# Patient Record
Sex: Female | Born: 1955 | Race: White | Hispanic: No | Marital: Married | Smoking: Never smoker
Health system: Southern US, Community
[De-identification: ages and names within clinical notes are randomized; demographics above are authoritative.]

## PROBLEM LIST (undated history)

## (undated) ENCOUNTER — Emergency Department (HOSPITAL_COMMUNITY): Admission: EM | Discharge status: Against Medical Advice | Payer: Self-pay

## (undated) DIAGNOSIS — I10 Essential (primary) hypertension: Secondary | ICD-10-CM

## (undated) DIAGNOSIS — I2510 Atherosclerotic heart disease of native coronary artery without angina pectoris: Secondary | ICD-10-CM

## (undated) DIAGNOSIS — E785 Hyperlipidemia, unspecified: Secondary | ICD-10-CM

## (undated) DIAGNOSIS — I700 Atherosclerosis of aorta: Secondary | ICD-10-CM

## (undated) DIAGNOSIS — G4700 Insomnia, unspecified: Secondary | ICD-10-CM

## (undated) DIAGNOSIS — I272 Pulmonary hypertension, unspecified: Secondary | ICD-10-CM

## (undated) DIAGNOSIS — F418 Other specified anxiety disorders: Secondary | ICD-10-CM

## (undated) DIAGNOSIS — J45909 Unspecified asthma, uncomplicated: Secondary | ICD-10-CM

## (undated) DIAGNOSIS — I5189 Other ill-defined heart diseases: Secondary | ICD-10-CM

## (undated) DIAGNOSIS — M4800 Spinal stenosis, site unspecified: Secondary | ICD-10-CM

## (undated) DIAGNOSIS — F430 Acute stress reaction: Secondary | ICD-10-CM

## (undated) DIAGNOSIS — G4730 Sleep apnea, unspecified: Secondary | ICD-10-CM

## (undated) DIAGNOSIS — S52502A Unspecified fracture of the lower end of left radius, initial encounter for closed fracture: Secondary | ICD-10-CM

## (undated) DIAGNOSIS — M1990 Unspecified osteoarthritis, unspecified site: Secondary | ICD-10-CM

## (undated) DIAGNOSIS — M48061 Spinal stenosis, lumbar region without neurogenic claudication: Secondary | ICD-10-CM

## (undated) DIAGNOSIS — Z8249 Family history of ischemic heart disease and other diseases of the circulatory system: Secondary | ICD-10-CM

## (undated) DIAGNOSIS — I071 Rheumatic tricuspid insufficiency: Secondary | ICD-10-CM

## (undated) DIAGNOSIS — Z8781 Personal history of (healed) traumatic fracture: Secondary | ICD-10-CM

## (undated) HISTORY — DX: Pulmonary hypertension, unspecified: I27.20

## (undated) HISTORY — PX: JOINT REPLACEMENT: SHX530

## (undated) HISTORY — DX: Unspecified asthma, uncomplicated: J45.909

## (undated) HISTORY — DX: Insomnia, unspecified: G47.00

## (undated) HISTORY — DX: Atherosclerosis of aorta: I70.0

## (undated) HISTORY — DX: Spinal stenosis, lumbar region without neurogenic claudication: M48.061

## (undated) HISTORY — DX: Sleep apnea, unspecified: G47.30

## (undated) HISTORY — DX: Atherosclerotic heart disease of native coronary artery without angina pectoris: I25.10

## (undated) HISTORY — PX: WRIST FRACTURE SURGERY: SHX121

## (undated) HISTORY — DX: Other ill-defined heart diseases: I51.89

## (undated) HISTORY — PX: FRACTURE SURGERY: SHX138

## (undated) HISTORY — DX: Rheumatic tricuspid insufficiency: I07.1

---
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
## 1999-12-09 ENCOUNTER — Encounter: Admission: RE | Admit: 1999-12-09 | Discharge: 1999-12-09 | Payer: Self-pay | Admitting: Obstetrics and Gynecology

## 1999-12-09 ENCOUNTER — Encounter: Payer: Self-pay | Admitting: Obstetrics and Gynecology

## 2000-12-10 ENCOUNTER — Encounter: Admission: RE | Admit: 2000-12-10 | Discharge: 2000-12-10 | Payer: Self-pay | Admitting: Obstetrics and Gynecology

## 2000-12-10 ENCOUNTER — Encounter: Payer: Self-pay | Admitting: Obstetrics and Gynecology

## 2002-06-21 ENCOUNTER — Encounter: Admission: RE | Admit: 2002-06-21 | Discharge: 2002-06-21 | Payer: Self-pay | Admitting: Obstetrics and Gynecology

## 2002-06-21 ENCOUNTER — Encounter: Payer: Self-pay | Admitting: Obstetrics and Gynecology

## 2003-07-30 ENCOUNTER — Encounter: Admission: RE | Admit: 2003-07-30 | Discharge: 2003-07-30 | Payer: Self-pay | Admitting: Obstetrics and Gynecology

## 2004-01-08 ENCOUNTER — Ambulatory Visit: Payer: Self-pay | Admitting: Family Medicine

## 2004-01-22 ENCOUNTER — Ambulatory Visit: Payer: Self-pay | Admitting: Family Medicine

## 2007-01-04 ENCOUNTER — Ambulatory Visit: Payer: Self-pay | Admitting: Family Medicine

## 2007-01-04 DIAGNOSIS — G4730 Sleep apnea, unspecified: Secondary | ICD-10-CM

## 2007-01-04 HISTORY — DX: Sleep apnea, unspecified: G47.30

## 2007-01-04 LAB — CONVERTED CEMR LAB: Rapid Strep: NEGATIVE

## 2007-08-15 ENCOUNTER — Ambulatory Visit: Payer: Self-pay | Admitting: Family Medicine

## 2008-07-31 ENCOUNTER — Ambulatory Visit: Payer: Self-pay | Admitting: Family Medicine

## 2008-07-31 LAB — CONVERTED CEMR LAB
Bacteria, UA: 0
Bilirubin Urine: NEGATIVE
Casts: 0 /lpf
Epithelial cells, urine: 1 /lpf
Glucose, Urine, Semiquant: NEGATIVE
Ketones, urine, test strip: NEGATIVE
Nitrite: NEGATIVE
Protein, U semiquant: NEGATIVE
RBC / HPF: 0
Specific Gravity, Urine: 1.01
Urine crystals, microscopic: 0 /hpf
Urobilinogen, UA: 0.2
WBC Urine, dipstick: NEGATIVE
WBC, UA: 0 cells/hpf
Yeast, UA: 0
pH: 6

## 2008-11-14 ENCOUNTER — Ambulatory Visit: Payer: Self-pay | Admitting: Family Medicine

## 2008-11-19 LAB — CONVERTED CEMR LAB
ALT: 18 units/L (ref 0–35)
AST: 24 units/L (ref 0–37)
Albumin: 3.9 g/dL (ref 3.5–5.2)
Alkaline Phosphatase: 54 units/L (ref 39–117)
BUN: 14 mg/dL (ref 6–23)
Basophils Absolute: 0 10*3/uL (ref 0.0–0.1)
Basophils Relative: 0.2 % (ref 0.0–3.0)
Bilirubin, Direct: 0.1 mg/dL (ref 0.0–0.3)
CO2: 27 meq/L (ref 19–32)
Calcium: 8.9 mg/dL (ref 8.4–10.5)
Chloride: 105 meq/L (ref 96–112)
Cholesterol: 193 mg/dL (ref 0–200)
Creatinine, Ser: 0.6 mg/dL (ref 0.4–1.2)
Eosinophils Absolute: 0.1 10*3/uL (ref 0.0–0.7)
Eosinophils Relative: 1.8 % (ref 0.0–5.0)
GFR calc non Af Amer: 110.69 mL/min (ref >60)
Glucose, Bld: 86 mg/dL (ref 70–99)
HCT: 36.2 % (ref 36.0–46.0)
HDL: 48.8 mg/dL (ref >39.00)
Hemoglobin: 12.7 g/dL (ref 12.0–15.0)
LDL Cholesterol: 128 mg/dL — ABNORMAL HIGH (ref 0–99)
Lymphocytes Relative: 50.9 % — ABNORMAL HIGH (ref 12.0–46.0)
Lymphs Abs: 3.1 10*3/uL (ref 0.7–4.0)
MCHC: 35.2 g/dL (ref 30.0–36.0)
MCV: 95.5 fL (ref 78.0–100.0)
Monocytes Absolute: 0.6 10*3/uL (ref 0.1–1.0)
Monocytes Relative: 10.8 % (ref 3.0–12.0)
Neutro Abs: 2.2 10*3/uL (ref 1.4–7.7)
Neutrophils Relative %: 36.3 % — ABNORMAL LOW (ref 43.0–77.0)
Platelets: 178 10*3/uL (ref 150.0–400.0)
Potassium: 3.8 meq/L (ref 3.5–5.1)
RBC: 3.79 M/uL — ABNORMAL LOW (ref 3.87–5.11)
RDW: 12.4 % (ref 11.5–14.6)
Sodium: 140 meq/L (ref 135–145)
TSH: 1.46 microintl units/mL (ref 0.35–5.50)
Total Bilirubin: 0.7 mg/dL (ref 0.3–1.2)
Total CHOL/HDL Ratio: 4
Total Protein: 7 g/dL (ref 6.0–8.3)
Triglycerides: 81 mg/dL (ref 0.0–149.0)
VLDL: 16.2 mg/dL (ref 0.0–40.0)
WBC: 6 10*3/uL (ref 4.5–10.5)

## 2008-11-20 ENCOUNTER — Other Ambulatory Visit: Admission: RE | Admit: 2008-11-20 | Discharge: 2008-11-20 | Payer: Self-pay | Admitting: Family Medicine

## 2008-11-20 ENCOUNTER — Ambulatory Visit: Payer: Self-pay | Admitting: Family Medicine

## 2008-11-20 ENCOUNTER — Encounter: Payer: Self-pay | Admitting: Family Medicine

## 2008-11-20 DIAGNOSIS — G4700 Insomnia, unspecified: Secondary | ICD-10-CM | POA: Insufficient documentation

## 2008-11-20 LAB — HM PAP SMEAR

## 2008-11-23 ENCOUNTER — Encounter (INDEPENDENT_AMBULATORY_CARE_PROVIDER_SITE_OTHER): Payer: Self-pay | Admitting: *Deleted

## 2009-06-25 ENCOUNTER — Telehealth: Payer: Self-pay | Admitting: Family Medicine

## 2009-06-27 ENCOUNTER — Encounter (INDEPENDENT_AMBULATORY_CARE_PROVIDER_SITE_OTHER): Payer: Self-pay | Admitting: *Deleted

## 2009-07-05 ENCOUNTER — Encounter: Payer: Self-pay | Admitting: Family Medicine

## 2009-07-16 ENCOUNTER — Encounter: Payer: Self-pay | Admitting: Family Medicine

## 2009-07-17 ENCOUNTER — Encounter: Payer: Self-pay | Admitting: Family Medicine

## 2009-08-08 ENCOUNTER — Encounter (INDEPENDENT_AMBULATORY_CARE_PROVIDER_SITE_OTHER): Payer: Self-pay | Admitting: *Deleted

## 2009-08-09 ENCOUNTER — Ambulatory Visit: Payer: Self-pay | Admitting: Gastroenterology

## 2009-08-19 HISTORY — PX: COLONOSCOPY: SHX174

## 2009-08-30 ENCOUNTER — Ambulatory Visit: Payer: Self-pay | Admitting: Gastroenterology

## 2009-08-30 LAB — HM COLONOSCOPY

## 2009-12-02 ENCOUNTER — Telehealth (INDEPENDENT_AMBULATORY_CARE_PROVIDER_SITE_OTHER): Payer: Self-pay | Admitting: *Deleted

## 2009-12-03 ENCOUNTER — Ambulatory Visit: Payer: Self-pay | Admitting: Family Medicine

## 2009-12-04 LAB — CONVERTED CEMR LAB
ALT: 19 units/L (ref 0–35)
AST: 21 units/L (ref 0–37)
Albumin: 3.8 g/dL (ref 3.5–5.2)
Alkaline Phosphatase: 58 units/L (ref 39–117)
BUN: 15 mg/dL (ref 6–23)
Basophils Absolute: 0 10*3/uL (ref 0.0–0.1)
Basophils Relative: 0.6 % (ref 0.0–3.0)
Bilirubin, Direct: 0.1 mg/dL (ref 0.0–0.3)
CO2: 28 meq/L (ref 19–32)
Calcium: 9.2 mg/dL (ref 8.4–10.5)
Chloride: 102 meq/L (ref 96–112)
Cholesterol: 204 mg/dL — ABNORMAL HIGH (ref 0–200)
Creatinine, Ser: 0.7 mg/dL (ref 0.4–1.2)
Direct LDL: 137.8 mg/dL
Eosinophils Absolute: 0.1 10*3/uL (ref 0.0–0.7)
Eosinophils Relative: 2 % (ref 0.0–5.0)
GFR calc non Af Amer: 95.43 mL/min (ref >60)
Glucose, Bld: 86 mg/dL (ref 70–99)
HCT: 39.7 % (ref 36.0–46.0)
HDL: 54.8 mg/dL (ref >39.00)
Hemoglobin: 13.6 g/dL (ref 12.0–15.0)
Lymphocytes Relative: 52.4 % — ABNORMAL HIGH (ref 12.0–46.0)
Lymphs Abs: 3.5 10*3/uL (ref 0.7–4.0)
MCHC: 34.3 g/dL (ref 30.0–36.0)
MCV: 94 fL (ref 78.0–100.0)
Monocytes Absolute: 0.6 10*3/uL (ref 0.1–1.0)
Monocytes Relative: 9.5 % (ref 3.0–12.0)
Neutro Abs: 2.4 10*3/uL (ref 1.4–7.7)
Neutrophils Relative %: 35.5 % — ABNORMAL LOW (ref 43.0–77.0)
Platelets: 224 10*3/uL (ref 150.0–400.0)
Potassium: 4.3 meq/L (ref 3.5–5.1)
RBC: 4.23 M/uL (ref 3.87–5.11)
RDW: 13 % (ref 11.5–14.6)
Sodium: 137 meq/L (ref 135–145)
TSH: 0.78 microintl units/mL (ref 0.35–5.50)
Total Bilirubin: 0.5 mg/dL (ref 0.3–1.2)
Total CHOL/HDL Ratio: 4
Total Protein: 6.8 g/dL (ref 6.0–8.3)
Triglycerides: 98 mg/dL (ref 0.0–149.0)
VLDL: 19.6 mg/dL (ref 0.0–40.0)
WBC: 6.7 10*3/uL (ref 4.5–10.5)

## 2009-12-06 ENCOUNTER — Ambulatory Visit: Payer: Self-pay | Admitting: Family Medicine

## 2010-02-18 NOTE — Miscellaneous (Signed)
Summary: mammogram screening  Clinical Lists Changes  Observations: Added new observation of MAMMO DUE: 07/2010 (07/17/2009 8:34) Added new observation of MAMMOGRAM: normal (07/16/2009 8:34)      Preventive Care Screening  Mammogram:    Date:  07/16/2009    Next Due:  07/2010    Results:  normal

## 2010-02-18 NOTE — Letter (Signed)
Summary: Previsit letter  [facility]  [address] [place], [place] [address]   Phone: [phone]  Fax: [phone]       06/27/2009 MRN: [id number]  Jamie Garza [address] [place], [place]  [address]  Dear [name],  Welcome to the Gastroenterology Division at [facility].    You are scheduled to see a nurse for your pre-procedure visit on 08/09/2009 at 2:30PM on the 3rd floor at [facility], [address]  We ask that you try to arrive at our office 15 minutes prior to your appointment time to allow for check-in.  Your nurse visit will consist of discussing your medical and surgical history, your immediate family medical history, and your medications.    Please bring a complete list of all your medications or, if you prefer, bring the medication bottles and we will list them.  We will need to be aware of both prescribed and over the counter drugs.  We will need to know exact dosage information as well.  If you are on blood thinners (Coumadin, Plavix, Aggrenox, Ticlid, etc.) please call our office today/prior to your appointment, as we need to consult with your physician about holding your medication.   Please be prepared to read and sign documents such as consent forms, a financial agreement, and acknowledgement forms.  If necessary, and with your consent, a friend or relative is welcome to sit-in on the nurse visit with you.  Please bring your insurance card so that we may make a copy of it.  If your insurance requires a referral to see a specialist, please bring your referral form from your primary care physician.  No co-pay is required for this nurse visit.     If you cannot keep your appointment, please call [phone] to cancel or reschedule prior to your appointment date.  This allows [place] the opportunity to schedule an appointment for another patient in need of care.    Thank you for choosing [facility] for your medical needs.   We appreciate the opportunity to care for you.  Please visit [place] at our website  to learn more about our practice.                     Sincerely.                                                                                                                   The Gastroenterology Division

## 2010-02-18 NOTE — Progress Notes (Signed)
Summary: Mammogram and colonoscopy  Phone Note Call from Patient Call back at Work Phone [phone] Call back at Cell  [phone]   Caller: Patient Call For: Judith Part MD Summary of Call: 1. Patient says she needs an order for her mammogram.  She would like to go to [facility] and prefers a Friday afternoon after 1 p.m. appt.   2. She also needs to go ahead and set up her colonoscopy.  Her deductible starts again after September so she would like it scheduled at the end of July or August if at all possible. Initial call taken by: Delilah Shan CMA Duncan Dull),  June 25, 2009 2:16 PM  Follow-up for Phone Call        I ordered both to send to [facility] Follow-up by: Judith Part MD,  June 25, 2009 8:13 PM  New Problems: SCREENING, COLON CANCER (ICD-V76.51) OTHER SCREENING MAMMOGRAM (ICD-V76.12)   New Problems: SCREENING, COLON CANCER (ICD-V76.51) OTHER SCREENING MAMMOGRAM (ICD-V76.12)

## 2010-02-18 NOTE — Letter (Signed)
Summary: [facility] Instructions  [facility]  [address] [place], [place] [address]   Phone: [phone]  Fax: [phone]       KAEYA SCHIFFER    07-Dec-1955    MRN: [id number]        Procedure Day /Date:  08/30/09  Friday     Arrival Time:  8:00am     Procedure Time: 9:00am     Location of Procedure:                    _ x_  [facility] (4th Floor)                        PREPARATION FOR COLONOSCOPY WITH MOVIPREP   Starting 5 days prior to your procedure _8/7/11 _ do not eat nuts, seeds, popcorn, corn, beans, peas,  salads, or any raw vegetables.  Do not take any fiber supplements (e.g. Metamucil, Citrucel, and Benefiber).  THE DAY BEFORE YOUR PROCEDURE         DATE:  08/29/09  DAY: Thursday  1.  Drink clear liquids the entire day-NO SOLID FOOD  2.  Do not drink anything colored red or purple.  Avoid juices with pulp.  No orange juice.  3.  Drink at least 64 oz. (8 glasses) of fluid/clear liquids during the day to prevent dehydration and help the prep work efficiently.  CLEAR LIQUIDS INCLUDE: Water Jello Ice Popsicles Tea (sugar ok, no milk/cream) Powdered fruit flavored drinks Coffee (sugar ok, no milk/cream) Gatorade Juice: apple, white grape, white cranberry  Lemonade Clear bullion, consomm, broth Carbonated beverages (any kind) Strained chicken noodle soup Hard Candy                             4.  In the morning, mix first dose of MoviPrep solution:    Empty 1 Pouch A and 1 Pouch B into the disposable container    Add lukewarm drinking water to the top line of the container. Mix to dissolve    Refrigerate (mixed solution should be used within 24 hrs)  5.  Begin drinking the prep at 5:00 p.m. The MoviPrep container is divided by 4 marks.   Every 15 minutes drink the solution down to the next mark (approximately 8 oz) until the full liter is complete.   6.  Follow completed prep with 16 oz of clear liquid of your choice (Nothing  red or purple).  Continue to drink clear liquids until bedtime.  7.  Before going to bed, mix second dose of MoviPrep solution:    Empty 1 Pouch A and 1 Pouch B into the disposable container    Add lukewarm drinking water to the top line of the container. Mix to dissolve    Refrigerate  THE DAY OF YOUR PROCEDURE      DATE:  08/30/09  DAY:  Friday  Beginning at 4:00 a.m. (5 hours before procedure):         1. Every 15 minutes, drink the solution down to the next mark (approx 8 oz) until the full liter is complete.  2. Follow completed prep with 16 oz. of clear liquid of your choice.    3. You may drink clear liquids until _ 7:00am _ (2 HOURS BEFORE PROCEDURE).   MEDICATION INSTRUCTIONS  Unless otherwise instructed, you should take regular prescription medications with a small sip of water  as early as possible the morning of your procedure.           OTHER INSTRUCTIONS  You will need a responsible adult at least 56 years of age to accompany you and drive you home.   This person must remain in the waiting room during your procedure.  Wear loose fitting clothing that is easily removed.  Leave jewelry and other valuables at home.  However, you may wish to bring a book to read or  an iPod/MP3 player to listen to music as you wait for your procedure to start.  Remove all body piercing jewelry and leave at home.  Total time from sign-in until discharge is approximately 2-3 hours.  You should go home directly after your procedure and rest.  You can resume normal activities the  day after your procedure.  The day of your procedure you should not:   Drive   Make legal decisions   Operate machinery   Drink alcohol   Return to work  You will receive specific instructions about eating, activities and medications before you leave.    The above instructions have been reviewed and explained to me by   Clide Cliff, RN_______________________    I fully understand  and can verbalize these instructions _____________________________ Date _________

## 2010-02-18 NOTE — Letter (Signed)
Summary: Results Follow up Letter  [place] [facility]  [address] [place], [place] [address]   Phone: [phone]  Fax: [phone]    07/17/2009 MRN: [id number]    Jamie Garza [address] [place], [place]  [address]    Dear [name],  The following are the results of your recent test(s):  Test         Result    Pap Smear:        Normal _____  Not Normal _____ Comments: ______________________________________________________ Cholesterol: LDL(Bad cholesterol):         Your goal is less than:         HDL (Good cholesterol):       Your goal is more than: Comments:  ______________________________________________________ Mammogram:        Normal ___X__  Not Normal _____ Comments:Please repeat mammogram in one year.  ___________________________________________________________________ Hemoccult:        Normal _____  Not normal _______ Comments:    _____________________________________________________________________ Other Tests:    We routinely do not discuss normal results over the telephone.  If you desire a copy of the results, or you have any questions about this information we can discuss them at your next office visit.   Sincerely,    Idamae Schuller Tower,MD  MT/ri

## 2010-02-18 NOTE — Procedures (Signed)
Summary: Colonoscopy  Patient: Jamie Garza Note: All result statuses are Final unless otherwise noted.  Tests: (1) Colonoscopy (COL)   COL Colonoscopy           DONE     [facility]     [address]     [place], [place]  [address]           COLONOSCOPY PROCEDURE REPORT           PATIENT:  [name], [name]  MR#:  [id number]     BIRTHDATE:  04/30/1955, 53 yrs. old  GENDER:  female     ENDOSCOPIST:  Vania Rea. Jarold Motto, MD, [facility]     REF. BY:  Marne A. Milinda Antis, M.D.     PROCEDURE DATE:  08/30/2009     PROCEDURE:  Average-risk screening colonoscopy     G0121     ASA CLASS:  Class I     INDICATIONS:  Routine Risk Screening     MEDICATIONS:   Fentanyl 75 mcg IV, Versed 8 mg IV           DESCRIPTION OF PROCEDURE:   After the risks benefits and     alternatives of the procedure were thoroughly explained, informed     consent was obtained.  Digital rectal exam was performed and     revealed no abnormalities.   The LB CF-[id number] endoscope     was introduced through the anus and advanced to the cecum, which     was identified by both the appendix and ileocecal valve, without     limitations.  The quality of the prep was excellent, using     MoviPrep.  The instrument was then slowly withdrawn as the colon     was fully examined.     <<PROCEDUREIMAGES>>           FINDINGS:  Scattered diverticula were found.  No polyps or cancers     were seen.   Retroflexed views in the rectum revealed no     abnormalities.    The scope was then withdrawn from the patient     and the procedure completed.           COMPLICATIONS:  None     ENDOSCOPIC IMPRESSION:     1) Diverticula, scattered     2) No polyps or cancers     RECOMMENDATIONS:     1) high fiber diet     2) Continue current colorectal screening recommendations for     "routine risk" patients with a repeat colonoscopy in 10 years.     REPEAT EXAM:  No           ______________________________     Vania Rea. Jarold Motto, MD, [facility]       CC:           n.     eSIGNED:   Vania Rea. Patterson at 08/30/2009 09:12 AM           Levander Campion, [id number]  Note: An exclamation mark (!) indicates a result that was not dispersed into the flowsheet. Document Creation Date: 08/30/2009 9:13 AM _______________________________________________________________________  (1) Order result status: Final Collection or observation date-time: 08/30/2009 09:06 Requested date-time:  Receipt date-time:  Reported date-time:  Referring Physician:   Ordering Physician: Sheryn Bison [phone]) Specimen Source:  Source: Launa Grill Order Number: [id number] Lab site:   Appended Document: Colonoscopy    Clinical Lists Changes  Observations: Added new observation of COLONNXTDUE: 08/2019 (08/30/2009  11:07)     

## 2010-02-18 NOTE — Progress Notes (Signed)
----   Converted from flag ---- ---- 12/01/2009 9:56 AM, Judith Part MD wrote: please check wellness and lipid v70.0  ---- 11/28/2009 1:56 PM, Liane Comber CMA (AAMA) wrote: Lab orders please! Good Morning! This pt is scheduled for cpx labs Tuesday, which labs to draw and dx codes to use? Thanks Tasha ------------------------------

## 2010-02-18 NOTE — Miscellaneous (Signed)
Summary: DIR COL...AS.  Clinical Lists Changes  Medications: Added new medication of MOVIPREP 100 GM  SOLR (PEG-KCL-NACL-NASULF-NA ASC-C) As directed - Signed Rx of MOVIPREP 100 GM  SOLR (PEG-KCL-NACL-NASULF-NA ASC-C) As directed;  #1 x 0;  Signed;  Entered by: Clide Cliff RN;  Authorized by: Mardella Layman MD [facility];  Method used: Electronically to [facility] [address], [place], [place], [place]  [address], Ph: [phone], Fax: [phone] Observations: Added new observation of ALLERGY REV: Done (08/09/2009 14:10)    Prescriptions: MOVIPREP 100 GM  SOLR (PEG-KCL-NACL-NASULF-NA ASC-C) As directed  #1 x 0   Entered by:   Clide Cliff RN   Authorized by:   Mardella Layman MD [facility]   Signed by:   Clide Cliff RN on 08/09/2009   Method used:   Electronically to        [facility] [address] (retail)       [address]       [place], [place]  [address]       Ph: [phone]       Fax: [phone]   RxID:   [id number]

## 2010-02-18 NOTE — Assessment & Plan Note (Signed)
Summary: CPX/CLE   Vital Signs:  Patient profile:   55 year old female Height:      62.5 inches Weight:      144 pounds BMI:     26.01 Temp:     98.1 degrees F oral Pulse rate:   68 / minute Pulse rhythm:   regular BP sitting:   110 / 68  (left arm) Cuff size:   regular  Vitals Entered By: Lewanda Rife LPN (December 06, 2009 9:47 AM) CC: CPX LMP 4 yrs ago   History of Present Illness: here for wellness exam and to rev chronic med problems  is doing well   wt is down 12 lb lost more and gained back some  used wt watchers and even did well over tax season    110/68- good bp   hx of sleep apnea - tx with wt loss - helped   pap nl 11/ 10 has never had any abn paps  last 3 were normal  no new sexual partners  no gyn symptoms   mamd 6/11-- was ok  self exam --no lumps   colonosc nl 8/11- 10 year f/u rec  Td 2010 had a bad sinus infection 2 weeks ago  feels good but a little sneezing   lipids up a bit with trig 98 and HDL 54 and LDL 137 (was 128)  sleeps well with trazadone  needs refil   Allergies (verified): No Known Drug Allergies  Past History:  Past Medical History: sleep apnea  insomnia  Review of Systems General:  Complains of sleep disorder; denies fatigue, fever, loss of appetite, and malaise. Eyes:  Denies blurring and eye irritation. CV:  Denies chest pain or discomfort and lightheadness. Resp:  Denies cough, shortness of breath, and wheezing. GI:  Denies abdominal pain, bloody stools, change in bowel habits, indigestion, and nausea. GU:  Denies dysuria and urinary frequency. MS:  Denies muscle aches and cramps. Derm:  Denies itching, lesion(s), poor wound healing, and rash. Neuro:  Denies numbness and tingling. Psych:  Denies anxiety and depression. Endo:  Denies cold intolerance, excessive thirst, excessive urination, and heat intolerance. Heme:  Denies abnormal bruising and bleeding.  Physical Exam  General:   Well-developed,well-nourished,in no acute distress; alert,appropriate and cooperative throughout examination Head:  normocephalic, atraumatic, and no abnormalities observed.   Eyes:  vision grossly intact, pupils equal, pupils round, and pupils reactive to light.  no conjunctival pallor, injection or icterus  Ears:  R ear normal and L ear normal.   Nose:  no nasal discharge.  - nares are boggy Mouth:  pharynx pink and moist, no erythema, and no exudates.   Neck:  supple with full rom and no masses or thyromegally, no JVD or carotid bruit  Breasts:  No mass, nodules, thickening, tenderness, bulging, retraction, inflamation, nipple discharge or skin changes noted.   Lungs:  Normal respiratory effort, chest expands symmetrically. Lungs are clear to auscultation, no crackles or wheezes. Heart:  Normal rate and regular rhythm. S1 and S2 normal without gallop, murmur, click, rub or other extra sounds. Abdomen:  Bowel sounds positive,abdomen soft and non-tender without masses, organomegaly or hernias noted. no renal bruits  Msk:  No deformity or scoliosis noted of thoracic or lumbar spine.  no acute joint changes  Pulses:  R and L carotid,radial,femoral,dorsalis pedis and posterior tibial pulses are full and equal bilaterally Extremities:  No clubbing, cyanosis, edema, or deformity noted with normal full range of motion of all joints.  Neurologic:  sensation intact to light touch, gait normal, and DTRs symmetrical and normal.   Skin:  nl color and turgor lentigos diffusely Cervical Nodes:  No lymphadenopathy noted Axillary Nodes:  No palpable lymphadenopathy Inguinal Nodes:  No significant adenopathy Psych:  normal affect, talkative and pleasant    Impression & Recommendations:  Problem # 1:  HEALTH MAINTENANCE EXAM (ICD-V70.0) Assessment Comment Only reviewed health habits including diet, exercise and skin cancer prevention reviewed health maintenance list and family history pt will check on  cost of flu shot before getting it  disc labs in detail -- chol up -- disc diet   Problem # 2:  INSOMNIA (ICD-780.52)  doing well wit trazadone - will refil   Orders: Prescription Created Electronically [phone])  Complete Medication List: 1)  Estraven Otc  .... As directed 2)  Trazodone Hcl 50 Mg Tabs (Trazodone hcl) .Marland Kitchen.. 1 by mouth once daily at bedtime  Patient Instructions: 1)  cholesterol went up a bit  2)  you can raise your HDL (good cholesterol) by increasing exercise and eating omega 3 fatty acid supplement like fish oil or flax seed oil over the counter 3)  you can lower LDL (bad cholesterol) by limiting saturated fats in diet like red meat, fried foods, egg yolks, fatty breakfast meats, high fat dairy products and shellfish  4)  call back if you want to get a flu shot here  Prescriptions: TRAZODONE HCL 50 MG TABS (TRAZODONE HCL) 1 by mouth once daily at bedtime  #30 x 11   Entered and Authorized by:   Judith Part MD   Signed by:   Judith Part MD on 12/06/2009   Method used:   Electronically to        [facility] [address] (retail)       [address], [address]       [place], [place]  [address]       Ph: [phone]       Fax: [phone]   RxID:   [id number]    Orders Added: 1)  Prescription Created Electronically [G8553] 2)  Est. Patient 40-64 years [id number]    Current Allergies (reviewed today): No known allergies

## 2010-12-05 LAB — HM MAMMOGRAPHY: HM Mammogram: NEGATIVE

## 2010-12-08 ENCOUNTER — Other Ambulatory Visit: Payer: Self-pay | Admitting: Family Medicine

## 2010-12-08 NOTE — Telephone Encounter (Signed)
Will refill electronically  

## 2010-12-15 ENCOUNTER — Telehealth: Payer: Self-pay | Admitting: Family Medicine

## 2010-12-15 DIAGNOSIS — Z0000 Encounter for general adult medical examination without abnormal findings: Secondary | ICD-10-CM | POA: Insufficient documentation

## 2010-12-15 NOTE — Telephone Encounter (Signed)
Message copied by Judy Pimple on Mon Dec 15, 2010  6:29 PM ------      Message from: Alvina Chou      Created: Tue Dec 09, 2010  3:35 PM      Regarding: Labs, Tuesday 11.27       Patient is scheduled for CPX labs, please order future labs, Thanks , Camelia Eng

## 2010-12-16 ENCOUNTER — Other Ambulatory Visit (INDEPENDENT_AMBULATORY_CARE_PROVIDER_SITE_OTHER): Payer: BC Managed Care – PPO

## 2010-12-16 DIAGNOSIS — Z0000 Encounter for general adult medical examination without abnormal findings: Secondary | ICD-10-CM

## 2010-12-16 LAB — COMPREHENSIVE METABOLIC PANEL
BUN: 12 mg/dL (ref 6–23)
CO2: 27 mEq/L (ref 19–32)
Creatinine, Ser: 0.7 mg/dL (ref 0.4–1.2)
GFR: 90.45 mL/min (ref >60.00)
Glucose, Bld: 98 mg/dL (ref 70–99)
Total Bilirubin: 0.5 mg/dL (ref 0.3–1.2)

## 2010-12-16 LAB — LIPID PANEL
HDL: 59.8 mg/dL (ref >39.00)
Total CHOL/HDL Ratio: 3
Triglycerides: 93 mg/dL (ref 0.0–149.0)

## 2010-12-16 LAB — CBC WITH DIFFERENTIAL/PLATELET
Basophils Absolute: 0 10*3/uL (ref 0.0–0.1)
Eosinophils Relative: 2 % (ref 0.0–5.0)
HCT: 38.7 % (ref 36.0–46.0)
Hemoglobin: 13.1 g/dL (ref 12.0–15.0)
Lymphs Abs: 2.8 10*3/uL (ref 0.7–4.0)
Monocytes Relative: 9.3 % (ref 3.0–12.0)
Neutro Abs: 2.7 10*3/uL (ref 1.4–7.7)
RDW: 13.5 % (ref 11.5–14.6)

## 2010-12-18 ENCOUNTER — Encounter: Payer: Self-pay | Admitting: Family Medicine

## 2010-12-19 ENCOUNTER — Other Ambulatory Visit (HOSPITAL_COMMUNITY)
Admission: RE | Admit: 2010-12-19 | Discharge: 2010-12-19 | Disposition: A | Discharge status: Home / Self Care | Payer: BC Managed Care – PPO | Source: Ambulatory Visit | Attending: Family Medicine | Admitting: Family Medicine

## 2010-12-19 ENCOUNTER — Encounter: Payer: Self-pay | Admitting: Family Medicine

## 2010-12-19 ENCOUNTER — Ambulatory Visit (INDEPENDENT_AMBULATORY_CARE_PROVIDER_SITE_OTHER): Payer: BC Managed Care – PPO | Admitting: Family Medicine

## 2010-12-19 VITALS — BP 118/74 | HR 72 | Temp 98.3°F | Ht 62.25 in | Wt 151.8 lb

## 2010-12-19 DIAGNOSIS — Z1231 Encounter for screening mammogram for malignant neoplasm of breast: Secondary | ICD-10-CM

## 2010-12-19 DIAGNOSIS — Z01419 Encounter for gynecological examination (general) (routine) without abnormal findings: Secondary | ICD-10-CM | POA: Insufficient documentation

## 2010-12-19 DIAGNOSIS — Z0000 Encounter for general adult medical examination without abnormal findings: Secondary | ICD-10-CM

## 2010-12-19 DIAGNOSIS — Z1159 Encounter for screening for other viral diseases: Secondary | ICD-10-CM | POA: Insufficient documentation

## 2010-12-19 MED ORDER — TRAZODONE HCL 50 MG PO TABS: 50.0000 mg | ORAL_TABLET | Freq: Every day | ORAL | Status: DC

## 2010-12-19 NOTE — Patient Instructions (Signed)
Make sure to get your flu shot asap at a pharmacy Pap done today Keep working on healthy diet and exercise

## 2010-12-19 NOTE — Progress Notes (Signed)
Subjective:    Patient ID: Jamie Garza, female    DOB: 08-Mar-1955, 55 y.o.   MRN: [id number]  HPI Here for annual health mt exam and to review chronic medical problems  Is feeling good overall   Had some knee problems this year-- turned into arthritis , had some cortisone injections -- is getting better  Very stressful tax season   Is getting married new years eve !! Dating for almost a year - but friends 15 years   bp is 118/74 Wt is up 7 lb with bmi of 27 Diet- feels like she needs to better with that  Exercise - is pretty good , has to do lower impact activities   Lipids pretty good - she does eat both healthy and junk food (worse during stress) LDL 122 Lab Results  Component Value Date   CHOL 200 12/16/2010   HDL 59.80 12/16/2010   LDLCALC 122* 12/16/2010   LDLDIRECT 137.8 12/03/2009   TRIG 93.0 12/16/2010   CHOLHDL 3 12/16/2010   very good HDL  Flu shot- has not had yet -- insurance issues - will get at drugstore when she leaves here  TD 2010  Mam 2 weeks ago and was normal  Self exam no lumps or problems    Pap 11/10 Does not go to a gyn  Will do pap today- has a new partner   colonosc 8/11 - 10 year recall  Patient Active Problem List  Diagnoses  . INSOMNIA  . SLEEP APNEA  . Routine general medical examination at a health care facility  . Gynecological examination  . Other screening mammogram   Past Medical History  Diagnosis Date  . Sleep apnea   . Insomnia    No past surgical history on file. History  Substance Use Topics  . Smoking status: Never Smoker   . Smokeless tobacco: Not on file  . Alcohol Use: Yes     occasional   Family History  Problem Relation Age of Onset  . Diabetes Mother     borderline DM  . Diabetes Maternal Aunt   . Diabetes Maternal Grandfather   . Diabetes Paternal Grandfather    No Known Allergies No current outpatient prescriptions on file prior to visit.      Review of Systems Review of Systems    Constitutional: Negative for fever, appetite change, fatigue and unexpected weight change.  Eyes: Negative for pain and visual disturbance.  Respiratory: Negative for cough and shortness of breath.   Cardiovascular: Negative for cp or palpitations    Gastrointestinal: Negative for nausea, diarrhea and constipation.  Genitourinary: Negative for urgency and frequency.  Skin: Negative for pallor or rash   MSK pos for knee pain that is improved Neurological: Negative for weakness, light-headedness, numbness and headaches.  Hematological: Negative for adenopathy. Does not bruise/bleed easily.  Psychiatric/Behavioral: Negative for dysphoric mood. The patient is not nervous/anxious.          Objective:   Physical Exam  Constitutional: She appears well-developed and well-nourished. No distress.  HENT:  Head: Normocephalic and atraumatic.  Right Ear: External ear normal.  Left Ear: External ear normal.  Nose: Nose normal.  Mouth/Throat: Oropharynx is clear and moist.  Eyes: Conjunctivae and EOM are normal. Pupils are equal, round, and reactive to light. No scleral icterus.  Neck: Normal range of motion. Neck supple. No JVD present. Carotid bruit is not present. No thyromegaly present.  Cardiovascular: Normal rate, regular rhythm, normal heart sounds and intact distal pulses.  No murmur heard. Pulmonary/Chest: Effort normal and breath sounds normal. No respiratory distress. She has no wheezes. She exhibits no tenderness.  Abdominal: Soft. Bowel sounds are normal. She exhibits no distension, no abdominal bruit and no mass. There is no tenderness.  Genitourinary: Vagina normal and uterus normal. No breast swelling, tenderness, discharge or bleeding. No vaginal discharge found.       No breast masses noted   Musculoskeletal: Normal range of motion. She exhibits no edema.  Lymphadenopathy:    She has no cervical adenopathy.  Neurological: She is alert. She has normal reflexes. No cranial  nerve deficit. She exhibits normal muscle tone. Coordination normal.  Skin: Skin is warm and dry. No rash noted. No erythema. No pallor.  Psychiatric: She has a normal mood and affect.          Assessment & Plan:

## 2010-12-21 NOTE — Assessment & Plan Note (Signed)
Nl mam 2 wk ago  Nl breast exam Enc monthly self exams

## 2010-12-21 NOTE — Assessment & Plan Note (Signed)
Annual exam with pap - no problems Pt does have new partner- monogamous

## 2010-12-21 NOTE — Assessment & Plan Note (Signed)
Reviewed health habits including diet and exercise and skin cancer prevention Also reviewed health mt list, fam hx and immunizations  Rev wellness labs in detail 

## 2010-12-25 ENCOUNTER — Encounter: Payer: Self-pay | Admitting: *Deleted

## 2011-01-20 HISTORY — PX: WRIST FRACTURE SURGERY: SHX121

## 2011-03-05 ENCOUNTER — Encounter: Payer: Self-pay | Admitting: Family Medicine

## 2011-05-17 ENCOUNTER — Emergency Department: Payer: Self-pay | Admitting: Emergency Medicine

## 2011-05-17 ENCOUNTER — Ambulatory Visit: Payer: Self-pay | Admitting: Family Medicine

## 2011-05-23 ENCOUNTER — Ambulatory Visit (HOSPITAL_COMMUNITY)
Admission: AD | Admit: 2011-05-23 | Discharge: 2011-05-23 | Disposition: A | Discharge status: Home / Self Care | Payer: BC Managed Care – PPO | Source: Ambulatory Visit | Attending: Orthopedic Surgery | Admitting: Orthopedic Surgery

## 2011-05-23 ENCOUNTER — Inpatient Hospital Stay (HOSPITAL_COMMUNITY): Payer: BC Managed Care – PPO | Admitting: Anesthesiology

## 2011-05-23 ENCOUNTER — Encounter (HOSPITAL_COMMUNITY): Payer: Self-pay | Admitting: *Deleted

## 2011-05-23 ENCOUNTER — Encounter (HOSPITAL_COMMUNITY)
Admission: AD | Disposition: A | Discharge status: Home / Self Care | Payer: Self-pay | Source: Ambulatory Visit | Attending: Orthopedic Surgery

## 2011-05-23 ENCOUNTER — Encounter (HOSPITAL_COMMUNITY): Payer: Self-pay | Admitting: Anesthesiology

## 2011-05-23 DIAGNOSIS — Z8781 Personal history of (healed) traumatic fracture: Secondary | ICD-10-CM | POA: Diagnosis present

## 2011-05-23 DIAGNOSIS — S52599A Other fractures of lower end of unspecified radius, initial encounter for closed fracture: Secondary | ICD-10-CM | POA: Insufficient documentation

## 2011-05-23 DIAGNOSIS — S52502A Unspecified fracture of the lower end of left radius, initial encounter for closed fracture: Secondary | ICD-10-CM

## 2011-05-23 DIAGNOSIS — X58XXXA Exposure to other specified factors, initial encounter: Secondary | ICD-10-CM | POA: Insufficient documentation

## 2011-05-23 HISTORY — DX: Unspecified fracture of the lower end of left radius, initial encounter for closed fracture: S52.502A

## 2011-05-23 HISTORY — DX: Personal history of (healed) traumatic fracture: Z87.81

## 2011-05-23 LAB — COMPREHENSIVE METABOLIC PANEL
ALT: 12 U/L (ref 0–35)
AST: 19 U/L (ref 0–37)
Calcium: 9.3 mg/dL (ref 8.4–10.5)
Creatinine, Ser: 0.57 mg/dL (ref 0.50–1.10)
GFR calc Af Amer: >90 mL/min (ref >90)
Sodium: 141 mEq/L (ref 135–145)
Total Protein: 7.3 g/dL (ref 6.0–8.3)

## 2011-05-23 LAB — CBC
MCH: 31.6 pg (ref 26.0–34.0)
MCHC: 34 g/dL (ref 30.0–36.0)
Platelets: 208 10*3/uL (ref 150–400)

## 2011-05-23 LAB — SURGICAL PCR SCREEN: MRSA, PCR: NEGATIVE

## 2011-05-23 SURGERY — OPEN REDUCTION INTERNAL FIXATION (ORIF) DISTAL RADIUS FRACTURE
Anesthesia: General | Site: Wrist | Laterality: Left | Wound class: Clean

## 2011-05-23 MED ORDER — LACTATED RINGERS IV SOLN
INTRAVENOUS | Status: DC | PRN
  Administered 2011-05-23 (×2): via INTRAVENOUS

## 2011-05-23 MED ORDER — LIDOCAINE-EPINEPHRINE (PF) 2 %-1:200000 IJ SOLN
INTRAMUSCULAR | Status: DC | PRN
  Administered 2011-05-23: 15 mL via INTRADERMAL

## 2011-05-23 MED ORDER — HYDROMORPHONE HCL PF 1 MG/ML IJ SOLN: 0.2500 mg | INTRAMUSCULAR | Status: DC | PRN

## 2011-05-23 MED ORDER — BUPIVACAINE-EPINEPHRINE PF 0.5-1:200000 % IJ SOLN
INTRAMUSCULAR | Status: DC | PRN
  Administered 2011-05-23: 30 mL

## 2011-05-23 MED ORDER — PROPOFOL 10 MG/ML IV EMUL
INTRAVENOUS | Status: DC | PRN
  Administered 2011-05-23: 150 mg via INTRAVENOUS

## 2011-05-23 MED ORDER — MUPIROCIN 2 % EX OINT
TOPICAL_OINTMENT | CUTANEOUS | Status: AC
  Filled 2011-05-23: qty 22

## 2011-05-23 MED ORDER — FENTANYL CITRATE 0.05 MG/ML IJ SOLN
INTRAMUSCULAR | Status: DC | PRN
  Administered 2011-05-23 (×2): 50 ug via INTRAVENOUS

## 2011-05-23 MED ORDER — ONDANSETRON HCL 4 MG/2ML IJ SOLN
INTRAMUSCULAR | Status: DC | PRN
  Administered 2011-05-23: 4 mg via INTRAVENOUS

## 2011-05-23 MED ORDER — CEFAZOLIN SODIUM 1-5 GM-% IV SOLN
INTRAVENOUS | Status: AC
  Administered 2011-05-23: 1 g via INTRAVENOUS
  Filled 2011-05-23: qty 50

## 2011-05-23 MED ORDER — DEXTROSE 5 % IV SOLN
INTRAVENOUS | Status: DC | PRN
  Administered 2011-05-23: 10:00:00 via INTRAVENOUS

## 2011-05-23 MED ORDER — MIDAZOLAM HCL 5 MG/5ML IJ SOLN
INTRAMUSCULAR | Status: DC | PRN
  Administered 2011-05-23 (×2): 1 mg via INTRAVENOUS

## 2011-05-23 SURGICAL SUPPLY — 65 items
APL SKNCLS STERI-STRIP NONHPOA (GAUZE/BANDAGES/DRESSINGS) ×1
BANDAGE ELASTIC 3 VELCRO ST LF (GAUZE/BANDAGES/DRESSINGS) ×2 IMPLANT
BANDAGE ELASTIC 4 VELCRO ST LF (GAUZE/BANDAGES/DRESSINGS) ×2 IMPLANT
BENZOIN TINCTURE PRP APPL 2/3 (GAUZE/BANDAGES/DRESSINGS) ×2 IMPLANT
BIT DRILL 2 FAST STEP (BIT) ×1 IMPLANT
BIT DRILL 2.5X4 QC (BIT) ×1 IMPLANT
BLADE SURG ROTATE 9660 (MISCELLANEOUS) IMPLANT
BNDG CMPR 9X4 STRL LF SNTH (GAUZE/BANDAGES/DRESSINGS) ×1
BNDG ESMARK 4X9 LF (GAUZE/BANDAGES/DRESSINGS) ×2 IMPLANT
CLOTH BEACON ORANGE TIMEOUT ST (SAFETY) ×2 IMPLANT
CLSR STERI-STRIP ANTIMIC 1/2X4 (GAUZE/BANDAGES/DRESSINGS) ×1 IMPLANT
CORDS BIPOLAR (ELECTRODE) ×2 IMPLANT
COVER SURGICAL LIGHT HANDLE (MISCELLANEOUS) ×4 IMPLANT
CUFF TOURNIQUET SINGLE 18IN (TOURNIQUET CUFF) ×2 IMPLANT
CUFF TOURNIQUET SINGLE 24IN (TOURNIQUET CUFF) IMPLANT
DECANTER SPIKE VIAL GLASS SM (MISCELLANEOUS) IMPLANT
DRAPE INCISE IOBAN 66X45 STRL (DRAPES) IMPLANT
DRAPE OEC MINIVIEW 54X84 (DRAPES) IMPLANT
DRAPE U-SHAPE 47X51 STRL (DRAPES) ×2 IMPLANT
ELECT REM PT RETURN 9FT ADLT (ELECTROSURGICAL)
ELECTRODE REM PT RTRN 9FT ADLT (ELECTROSURGICAL) IMPLANT
GAUZE XEROFORM 1X8 LF (GAUZE/BANDAGES/DRESSINGS) ×2 IMPLANT
GLOVE BIOGEL PI IND STRL 8 (GLOVE) ×1 IMPLANT
GLOVE BIOGEL PI INDICATOR 8 (GLOVE) ×1
GLOVE ORTHO TXT STRL SZ7.5 (GLOVE) ×2 IMPLANT
GLOVE SS BIOGEL STRL SZ 8 (GLOVE) ×1 IMPLANT
GLOVE SUPERSENSE BIOGEL SZ 8 (GLOVE) ×1
GLOVE SURG ORTHO 8.0 STRL STRW (GLOVE) ×4 IMPLANT
GOWN STRL NON-REIN LRG LVL3 (GOWN DISPOSABLE) IMPLANT
K-WIRE 1.6 (WIRE) ×6
K-WIRE FX5X1.6XNS BN SS (WIRE) ×3
KIT BASIN OR (CUSTOM PROCEDURE TRAY) ×2 IMPLANT
KIT ROOM TURNOVER OR (KITS) ×2 IMPLANT
KWIRE FX5X1.6XNS BN SS (WIRE) IMPLANT
LOOP VESSEL MAXI BLUE (MISCELLANEOUS) ×2 IMPLANT
MANIFOLD NEPTUNE II (INSTRUMENTS) ×2 IMPLANT
NDL BLUNT 16X1.5 OR ONLY (NEEDLE) IMPLANT
NEEDLE 22X1 1/2 (OR ONLY) (NEEDLE) IMPLANT
NEEDLE BLUNT 16X1.5 OR ONLY (NEEDLE) IMPLANT
NS IRRIG 1000ML POUR BTL (IV SOLUTION) ×4 IMPLANT
PACK ORTHO EXTREMITY (CUSTOM PROCEDURE TRAY) ×2 IMPLANT
PAD ARMBOARD 7.5X6 YLW CONV (MISCELLANEOUS) ×4 IMPLANT
PAD CAST 4YDX4 CTTN HI CHSV (CAST SUPPLIES) ×1 IMPLANT
PADDING CAST COTTON 4X4 STRL (CAST SUPPLIES) ×2
PEG SUBCHONDRAL SMOOTH 2.0X16 (Peg) ×2 IMPLANT
PEG SUBCHONDRAL SMOOTH 2.0X18 (Peg) ×4 IMPLANT
PLATE SHORT 21.6X48.9 NRRW LT (Plate) ×1 IMPLANT
SCREW BN 12X3.5XNS CORT TI (Screw) IMPLANT
SCREW CORT 3.5X12 (Screw) ×4 IMPLANT
SPLINT PLASTER CAST XFAST 4X15 (CAST SUPPLIES) IMPLANT
SPLINT PLASTER XTRA FAST SET 4 (CAST SUPPLIES) ×1
SPONGE GAUZE 4X4 12PLY (GAUZE/BANDAGES/DRESSINGS) ×2 IMPLANT
SPONGE LAP 4X18 X RAY DECT (DISPOSABLE) ×2 IMPLANT
STAPLER VISISTAT 35W (STAPLE) IMPLANT
STRIP CLOSURE SKIN 1/2X4 (GAUZE/BANDAGES/DRESSINGS) ×2 IMPLANT
SUCTION FRAZIER TIP 10 FR DISP (SUCTIONS) ×2 IMPLANT
SUT ETHILON 4 0 PS 2 18 (SUTURE) IMPLANT
SUT PROLENE 4 0 PS 2 18 (SUTURE) IMPLANT
SUT VIC AB 3-0 FS2 27 (SUTURE) IMPLANT
SYR CONTROL 10ML LL (SYRINGE) IMPLANT
TOWEL OR 17X24 6PK STRL BLUE (TOWEL DISPOSABLE) ×2 IMPLANT
TOWEL OR 17X26 10 PK STRL BLUE (TOWEL DISPOSABLE) ×2 IMPLANT
TUBE CONNECTING 12X1/4 (SUCTIONS) ×2 IMPLANT
WATER STERILE IRR 1000ML POUR (IV SOLUTION) ×4 IMPLANT
YANKAUER SUCT BULB TIP NO VENT (SUCTIONS) IMPLANT

## 2011-05-23 NOTE — Preoperative (Signed)
Beta Blockers   Reason not to administer Beta Blockers:Not Applicable 

## 2011-05-23 NOTE — Op Note (Signed)
05/23/2011  11:08 AM  PATIENT:  Jamie Garza    PRE-OPERATIVE DIAGNOSIS:  Left Distal Radius Fracture  POST-OPERATIVE DIAGNOSIS:  Same  PROCEDURE:  OPEN REDUCTION INTERNAL FIXATION (ORIF) DISTAL RADIAL FRACTURE, 3 pieces  SURGEON:  Eulas Post, MD  PHYSICIAN ASSISTANT: Janace Litten, OPA-C, present and scrubbed throughout the case, critical for completion in a timely fashion, and for retraction, instrumentation, and closure.  ANESTHESIA:   General  PREOPERATIVE INDICATIONS:  Jamie Garza is a  56 y.o. female with a diagnosis of Left Distal Radius Fracture whoelected for surgical management due to the intra-articular displacement, shortening, and loss of height.  The risks benefits and alternatives were discussed with the patient preoperatively including but not limited to the risks of infection, bleeding, nerve injury, cardiopulmonary complications, the need for revision surgery, among others, and the patient was willing to proceed. We also discussed the risks of tendon rupture, malunion, nonunion, hardware prominence, hardware failure, need for hardware removal, reflex sympathetic dystrophy, among others.  OPERATIVE IMPLANTS: Biomet DVR volar plate with 2 proximal cortical screws and multiple distal interlocking smooth pegs, using the short narrow plate.  OPERATIVE FINDINGS: Comminution of the distal radius fracture, with a volar Barton's shift.  OPERATIVE PROCEDURE: The patient is brought to the operating room and placed in the supine position. General anesthesia was administered. IV antibiotics were given. Time out was performed. Regional block and given. The left upper [place] was prepped and draped in usual sterile fashion. The arm was elevated and exsanguinated and the tourniquet was inflated. Total tourniquet time was approximately one hour. Volar approach to the distal radius was carried out, and the flexor carpi radialis was retracted radially. The radial artery was protected  throughout the case.  Deep dissection was carried down, and the pronator quadratus was elevated off of the radius. The fracture site was identified and cleaned and reduced anatomically. This keyed into place nicely. I had restored the volar Barton's displaced segment back to appropriate alignment. I held this provisionally with a K wire, and C-arm used to confirm alignment.  I had restored height and inclination and then applied a volar plate. A K wire was used to confirm appropriate position of the plate, and once I was satisfied with the overall alignment I was able to secure the plate proximally with a cortical screw. The plate position was a little bit challenging because the bone itself was so narrow, that even narrow plate filled the entire width of the distal radius.    I then secured the fracture with multiple smooth interlocking pegs distally, and confirmed that none of these were in the joint, and none of these were penetrating the dorsal cortex. I also secured the plate proximally with one more cortical screw. On the ulnar side of the plate distally I used 16 mm pegs, with 18 mm pegs on the radial side, and a 16 mm peg all for the radial styloid.  The wounds were irrigated copiously, and I repaired the pronator quadratus with 2-0 Vicryl followed by 3-0 subcutaneous Vicryl for the skin and Steri-Strips and sterile gauze and a volar splint. The tourniquet was released. She was awakened and returned back in stable and satisfactory condition. There no complications and she tolerated the procedure well.

## 2011-05-23 NOTE — H&P (Signed)
  PREOPERATIVE H&P  Chief Complaint: Left Distal Radius Fracture  HPI: Jamie Garza is a 56 y.o. female who presents for preoperative history and physical with a diagnosis of Left Distal Radius Fracture. Symptoms are rated as moderate to severe, and have been worsening.  This is significantly impairing activities of daily living.  She has elected for surgical management.   History reviewed. No pertinent past medical history. History reviewed. No pertinent past surgical history. History   Social History  . Marital Status: Married    Spouse Name: N/A    Number of Children: N/A  . Years of Education: N/A   Social History Main Topics  . Smoking status: None  . Smokeless tobacco: None  . Alcohol Use: No  . Drug Use: No  . Sexually Active:    Other Topics Concern  . None   Social History Narrative  . None   History reviewed. No pertinent family history. No Known Allergies Prior to Admission medications   Medication Sig Start Date End Date Taking? Authorizing Provider  Ascorbic Acid (VITAMIN C) 1000 MG tablet Take 1,000 mg by mouth daily.   Yes Historical Provider, MD  ibuprofen (ADVIL,MOTRIN) 800 MG tablet Take 800 mg by mouth every 8 (eight) hours as needed. pain   Yes Historical Provider, MD  LYSINE HCL PO Take 1 tablet by mouth daily.   Yes Historical Provider, MD  meloxicam (MOBIC) 15 MG tablet Take 15 mg by mouth daily as needed. Knee pain   Yes Historical Provider, MD  Misc Natural Products (OSTEO BI-FLEX ADV DOUBLE ST) CAPS Take 2 capsules by mouth daily.   Yes Historical Provider, MD  oxyCODONE-acetaminophen (PERCOCET) 5-325 MG per tablet Take 1-2 tablets by mouth every 4 (four) hours as needed. pain   Yes Historical Provider, MD  traZODone (DESYREL) 50 MG tablet Take 50 mg by mouth at bedtime as needed. sleep   Yes Historical Provider, MD     Positive ROS: All other systems have been reviewed and were otherwise negative with the exception of those mentioned in the HPI and  as above.  Physical Exam: General: Alert, no acute distress Cardiovascular: No pedal edema Respiratory: No cyanosis, no use of accessory musculature GI: No organomegaly, abdomen is soft and non-tender Skin: No lesions in the area of chief complaint Neurologic: Sensation intact distally Psychiatric: Patient is competent for consent with normal mood and affect Lymphatic: No axillary or cervical lymphadenopathy  MUSCULOSKELETAL: Left wrist with positive pain to palpation, ecchymosis. Sensation is intact at the hand. All fingers flex extend and abduct.  Assessment: Left Distal Radius Fracture  Plan: Plan for Procedure(s): OPEN REDUCTION INTERNAL FIXATION (ORIF) DISTAL RADIAL FRACTURE  The risks benefits and alternatives were discussed with the patient including but not limited to the risks of nonoperative treatment, versus surgical intervention including infection, bleeding, nerve injury, malunion, nonunion, the need for revision surgery, hardware prominence, hardware failure, the need for hardware removal, blood clots, cardiopulmonary complications, morbidity, mortality, among others, and they were willing to proceed.     Eulas Post, MD 05/23/2011 8:57 AM

## 2011-05-23 NOTE — Anesthesia Procedure Notes (Addendum)
Anesthesia Regional Block:  Axillary brachial plexus block  Pre-Anesthetic Checklist: ,, timeout performed, Correct Patient, Correct Site, Correct Laterality, Correct Procedure, Correct Position, site marked, Risks and benefits discussed,  Surgical consent,  Pre-op evaluation,  At surgeon's request and post-op pain management  Laterality: Left  Prep: chloraprep       Needles:  Injection technique: Single-shot      Needle Gauge: 22 and 22 G    Additional Needles:  Procedures: paresthesia technique Axillary brachial plexus block  Nerve Stimulator or Paresthesia:  Response: median nerve paresthesia,  Response: radial nerve paresthesia,   Additional Responses:   Narrative:  Start time: 05/23/2011 8:56 AM End time: 05/23/2011 9:03 AM Injection made incrementally with aspirations every 5 mL.  Performed by: Personally  Anesthesiologist: Sandford Craze, MD  Additional Notes: Pt identified in Holding room.  Monitors applied. Working IV access confirmed. Sterile prep L axilla.  #22ga blunt "B" bevel to transient median and radial persthesiae.  Total 15cc 2% Lidocaine with 1:200k epi and 30cc 0.5% Bupivacaine with 1:200k epi injected incrementally, distributed around each paresthesia after negative test doses.  Patient asymptomatic, VSS, no heme aspirated, tolerated well.   Sandford Craze, MD  Axillary brachial plexus block Procedure Name: LMA Insertion Date/Time: 05/23/2011 9:47 AM Performed by: Darcey Nora B Pre-anesthesia Checklist: Patient identified, Emergency Drugs available, Suction available and Patient being monitored Patient Re-evaluated:Patient Re-evaluated prior to inductionOxygen Delivery Method: Circle system utilized Preoxygenation: Pre-oxygenation with 100% oxygen Intubation Type: IV induction Ventilation: Mask ventilation without difficulty LMA: LMA with gastric port inserted LMA Size: 4.0 Number of attempts: 1 Placement Confirmation: positive ETCO2 and breath sounds  checked- equal and bilateral ETT to lip (cm): taped across flange to cheeks. Tube secured with: Tape Dental Injury: Teeth and Oropharynx as per pre-operative assessment

## 2011-05-23 NOTE — Discharge Instructions (Signed)
Wrist Fracture  Your caregiver has diagnosed you as having a fracture of the wrist. A fracture is a break in the bone or bones. A cast or splint is used to protect and keep your injured bone(s) from moving. The cast or splint will usually be on for about 5 to 6 weeks. One of the bones of the wrist (the navicular bone) often does not show up as a fracture on X-ray until later or in the healing phase. With this bone your caregiver will often cast as though it is fractured even if not seen on the X-ray.  HOME CARE INSTRUCTIONS   · To lessen the swelling, keep the injured part elevated while sitting or lying down. Keeping the injury above the level of your heart (the center of the chest) will decrease swelling and pain.  · Do not wear rings or jewelry on the injured hand or wrist.  · Apply ice to the injury for 15 to 20 minutes, 3 to 4 times per day while awake for 2 days. Put the ice in a plastic bag and place a thin towel between the bag of ice and your cast.  · If you have a plaster or fiberglass cast:  · Do not try to scratch the skin under the cast using sharp or pointed objects.  · Check the skin around the cast every day. You may put lotion on any red or sore areas.  · Keep your cast dry and clean.  · If you have a plaster splint:  · Wear the splint as directed.  · You may loosen the elastic around the splint if your fingers become numb, tingle, or turn cold or blue.  · If you have been put in a removable splint, wear and use as directed.  · Do not use powders or deodorants in or around the cast or splint.  · Do not remove padding from your cast or splint.  · Do not put pressure on any part of your cast or splint. It may break. Rest your cast or splint only on a pillow the first 24 hours until it is fully hardened.  · Gently move your fingers often, so they do not get stiff.  · Do not remove the splint unless directed by your caregiver. Casts must be removed by an orthopedist.  · Your cast or splint can be  protected during bathing with a plastic bag. Do not lower the cast or splint into water.  · Only take over-the-counter or prescription medicines for pain, discomfort, or fever as directed by your caregiver.  · Follow up with your caregiver as directed.  SEEK IMMEDIATE MEDICAL CARE IF:   · Your cast or splint gets damaged or breaks.  · Your cast or splint feels too tight or loose.  · You have increased pain, not controlled with medication.  · You have increased swelling.  · Your skin or nails below the injury turn blue or grey or feel cold or numb.  · You have trouble moving or feeling your fingers.  · You experience any burning or stinging from the cast or splint.  · There is a bad smell coming from under the cast or splint.  · New stains or fluids are coming from under the cast or splint.  · You have any new injuries while wearing the cast or splint.  Document Released: 10/15/2004 Document Revised: 12/25/2010 Document Reviewed: 08/04/2006  [employer]® Patient Information ©2012 [employer].

## 2011-05-23 NOTE — Transfer of Care (Signed)
Immediate Anesthesia Transfer of Care Note  Patient: Jamie Garza  Procedure(s) Performed: Procedure(s) (LRB): OPEN REDUCTION INTERNAL FIXATION (ORIF) DISTAL RADIAL FRACTURE (Left)  Patient Location: PACU  Anesthesia Type: General  Level of Consciousness: awake, alert , oriented and patient cooperative  Airway & Oxygen Therapy: Patient Spontanous Breathing and Patient connected to nasal cannula oxygen  Post-op Assessment: Report given to PACU RN, Post -op Vital signs reviewed and stable and Patient moving all extremities  Post vital signs: Reviewed and stable  Complications: No apparent anesthesia complications

## 2011-05-23 NOTE — Anesthesia Preprocedure Evaluation (Signed)
Anesthesia Evaluation  Patient identified by MRN, date of birth, ID band Patient awake    Reviewed: Allergy & Precautions, H&P , NPO status , Patient's Chart, lab work & pertinent test results  History of Anesthesia Complications Negative for: history of anesthetic complications  Airway Mallampati: II TM Distance: >3 FB Neck ROM: Full    Dental No notable dental hx. (+) Teeth Intact and Dental Advisory Given   Pulmonary neg pulmonary ROS,  breath sounds clear to auscultation  Pulmonary exam normal       Cardiovascular negative cardio ROS  Rhythm:Regular Rate:Normal     Neuro/Psych negative neurological ROS     GI/Hepatic negative GI ROS, Neg liver ROS,   Endo/Other  negative endocrine ROS  Renal/GU negative Renal ROS     Musculoskeletal   Abdominal   Peds  Hematology   Anesthesia Other Findings   Reproductive/Obstetrics                           Anesthesia Physical Anesthesia Plan  ASA: I  Anesthesia Plan: General   Post-op Pain Management:    Induction: Intravenous  Airway Management Planned: LMA  Additional Equipment:   Intra-op Plan:   Post-operative Plan:   Informed Consent: I have reviewed the patients History and Physical, chart, labs and discussed the procedure including the risks, benefits and alternatives for the proposed anesthesia with the patient or authorized representative who has indicated his/her understanding and acceptance.   Dental advisory given  Plan Discussed with: CRNA and Surgeon  Anesthesia Plan Comments: (Plan routine monitors, GA- LMA OK with axillary block for post op analgesia  )        Anesthesia Quick Evaluation

## 2011-05-23 NOTE — Anesthesia Postprocedure Evaluation (Signed)
  Anesthesia Post-op Note  Patient: Jamie Garza  Procedure(s) Performed: Procedure(s) (LRB): OPEN REDUCTION INTERNAL FIXATION (ORIF) DISTAL RADIAL FRACTURE (Left)  Patient Location: PACU  Anesthesia Type: GA combined with regional for post-op pain  Level of Consciousness: awake, alert  and oriented  Airway and Oxygen Therapy: Patient Spontanous Breathing  Post-op Pain: none  Post-op Assessment: Post-op Vital signs reviewed, Patient's Cardiovascular Status Stable, Respiratory Function Stable, Patent Airway, No signs of Nausea or vomiting and Pain level controlled  Post-op Vital Signs: Reviewed and stable  Complications: No apparent anesthesia complications

## 2011-05-23 NOTE — Progress Notes (Signed)
Orthopedic Tech Progress Note Patient Details:  Jamie Garza 1955-04-22 [id number]  Other Ortho Devices Ortho Device Location: arm sling Ortho Device Interventions: Application   Cammer, Mickie Bail 05/23/2011, 12:28 PM

## 2011-05-25 ENCOUNTER — Encounter: Payer: Self-pay | Admitting: Family Medicine

## 2011-05-25 MED FILL — Mupirocin Oint 2%: CUTANEOUS | Qty: 22 | Status: AC

## 2011-06-20 LAB — HM DEXA SCAN: HM Dexa Scan: NORMAL

## 2011-06-25 ENCOUNTER — Telehealth: Payer: Self-pay

## 2011-06-25 NOTE — Telephone Encounter (Signed)
Pt recently had wrist surgery and Dr Dion Saucier wrote rx for lab test and told pt could have done anywhere. Pt wanted to know where to go for labs. Advised to [facility] drawing station. Pt acknowledged.

## 2011-10-30 ENCOUNTER — Ambulatory Visit (INDEPENDENT_AMBULATORY_CARE_PROVIDER_SITE_OTHER): Payer: BC Managed Care – PPO | Admitting: Family Medicine

## 2011-10-30 ENCOUNTER — Encounter: Payer: Self-pay | Admitting: Family Medicine

## 2011-10-30 VITALS — BP 136/84 | HR 60 | Temp 98.5°F | Ht 62.0 in | Wt 138.5 lb

## 2011-10-30 DIAGNOSIS — F418 Other specified anxiety disorders: Secondary | ICD-10-CM

## 2011-10-30 DIAGNOSIS — F341 Dysthymic disorder: Secondary | ICD-10-CM

## 2011-10-30 HISTORY — DX: Other specified anxiety disorders: F41.8

## 2011-10-30 MED ORDER — ESCITALOPRAM OXALATE 10 MG PO TABS: 10.0000 mg | ORAL_TABLET | Freq: Every day | ORAL | Status: DC

## 2011-10-30 MED ORDER — TRAZODONE HCL 50 MG PO TABS: 50.0000 mg | ORAL_TABLET | Freq: Every day | ORAL | Status: DC

## 2011-10-30 NOTE — Progress Notes (Signed)
Subjective:    Patient ID: Jamie Garza, female    DOB: [birth year], 56 y.o.   MRN: [id number]  HPI Here for f/u   Wt is down 12 lb since last visit  Appetite is down  Is sleeping fairly with trazadone - not great but can by   Is exercising almost every day- it relieves her stress- goes to the gym   It has been a horrible year for her  Thinks she needs to get back on lexapro    Got married new years eve and that has not worked out  Tax season was worse than usual  MIL passed away She herself fell and broke her wrist   (accident , tripped in a restaurant) Her ex husband died in [place] husb got depressed and fell into alcoholism - and daughter had her committed invol to psych unit  Now he is still drinking - and they had to split up  (he was never abusive to her)  Has a mixture of depression and anxiety    She is seeing a Veterinary surgeon in [employer]  She has an excellent support network  Also faith  Her ortho doctor wanted her to have a dexa Had one and she was in the normal range- back better than hip  Takes ca and D   Patient Active Problem List  Diagnosis  . INSOMNIA  . SLEEP APNEA  . Routine general medical examination at a health care facility  . Gynecological examination  . Other screening mammogram  . Distal radius fracture, left   Past Medical History  Diagnosis Date  . Sleep apnea   . Insomnia   . Distal radius fracture, left 05/23/2011   No past surgical history on file. History  Substance Use Topics  . Smoking status: Never Smoker   . Smokeless tobacco: Not on file  . Alcohol Use: No     occasional   Family History  Problem Relation Age of Onset  . Diabetes Mother     borderline DM  . Diabetes Maternal Aunt   . Diabetes Maternal Grandfather   . Diabetes Paternal Grandfather    No Known Allergies Current Outpatient Prescriptions on File Prior to Visit  Medication Sig Dispense Refill  . calcium citrate-vitamin D (CITRACAL+D) 315-200 MG-UNIT per tablet  Take 1 tablet by mouth daily.      Marland Kitchen ibuprofen (ADVIL,MOTRIN) 800 MG tablet Take 800 mg by mouth every 8 (eight) hours as needed. pain      . LYSINE HCL PO Take 1 tablet by mouth daily.      . meloxicam (MOBIC) 15 MG tablet Take 1 tablet by mouth as needed.       . traZODone (DESYREL) 50 MG tablet Take 1 tablet (50 mg total) by mouth at bedtime.  30 tablet  11  . Misc Natural Products (OSTEO BI-FLEX ADV DOUBLE ST) CAPS Take 2 capsules by mouth daily.      Marland Kitchen DISCONTD: traZODone (DESYREL) 50 MG tablet Take 50 mg by mouth at bedtime as needed. sleep               Review of Systems Review of Systems  Constitutional: Negative for fever, appetite change, fatigue and unexpected weight change.  Eyes: Negative for pain and visual disturbance.  Respiratory: Negative for cough and shortness of breath.   Cardiovascular: Negative for cp or palpitations    Gastrointestinal: Negative for nausea, diarrhea and constipation.  Genitourinary: Negative for urgency and frequency.  Skin: Negative for pallor  or rash   Neurological: Negative for weakness, light-headedness, numbness and headaches.  Hematological: Negative for adenopathy. Does not bruise/bleed easily.  Psychiatric/Behavioral: pos for symptoms of depression and anxiety , neg for suicidal ideation        Objective:   Physical Exam  Constitutional: She appears well-developed and well-nourished. No distress.  HENT:  Head: Normocephalic and atraumatic.  Mouth/Throat: Oropharynx is clear and moist.  Eyes: Conjunctivae normal and EOM are normal. Pupils are equal, round, and reactive to light. Right eye exhibits no discharge. Left eye exhibits no discharge. No scleral icterus.  Neck: Normal range of motion. Neck supple. No JVD present. Carotid bruit is not present. No thyromegaly present.  Cardiovascular: Normal rate, regular rhythm, normal heart sounds and intact distal pulses.  Exam reveals no gallop.   Pulmonary/Chest: Effort normal and  breath sounds normal. No respiratory distress. She has no wheezes.  Abdominal: Soft. Bowel sounds are normal. She exhibits no distension, no abdominal bruit and no mass. There is no tenderness.  Lymphadenopathy:    She has no cervical adenopathy.  Neurological: She is alert. She has normal reflexes. She displays no tremor. No cranial nerve deficit. She exhibits normal muscle tone. Coordination normal.  Skin: Skin is warm and dry. No rash noted. No erythema.  Psychiatric: Her speech is normal. Judgment and thought content normal. Her mood appears anxious. Her affect is not blunt and not labile. Cognition and memory are normal. She does not exhibit a depressed mood. She expresses no homicidal and no suicidal ideation.       Tearful at times when discussing her stressors Is attentive and pleasant            Assessment & Plan:

## 2011-10-30 NOTE — Assessment & Plan Note (Addendum)
With enourmously stressful year- in counseling with good coping skills and support Will try lexapro 10 mg for anx and dep symptoms- she has done well with that in the past >25 min spent with face to face with patient, >50% counseling and/or coordinating care   Will update if worse or side eff No SI F/u winter as planned

## 2011-10-30 NOTE — Patient Instructions (Addendum)
Continue counseling  Start lexapro 10 mg and if you feel we need to increase it let me know  If worse anxiety or depressoin- call and stop the medicine Continue exercise and socialization  Will see you at your physical

## 2012-03-07 ENCOUNTER — Ambulatory Visit: Payer: Self-pay | Admitting: Family Medicine

## 2012-03-09 ENCOUNTER — Encounter: Payer: Self-pay | Admitting: Family Medicine

## 2012-03-10 ENCOUNTER — Encounter: Payer: Self-pay | Admitting: *Deleted

## 2012-03-13 ENCOUNTER — Telehealth: Payer: Self-pay | Admitting: Family Medicine

## 2012-03-13 DIAGNOSIS — Z0000 Encounter for general adult medical examination without abnormal findings: Secondary | ICD-10-CM

## 2012-03-13 NOTE — Telephone Encounter (Signed)
Message copied by Judy Pimple on Sun Mar 13, 2012  1:56 PM ------      Message from: Alvina Chou      Created: Wed Mar 09, 2012 10:21 AM      Regarding: Lab orders for Monday, 2.24.14       Patient is scheduled for CPX labs, please order future labs, Thanks , Terri       ------

## 2012-03-14 ENCOUNTER — Other Ambulatory Visit (INDEPENDENT_AMBULATORY_CARE_PROVIDER_SITE_OTHER): Payer: BC Managed Care – PPO

## 2012-03-14 DIAGNOSIS — Z0000 Encounter for general adult medical examination without abnormal findings: Secondary | ICD-10-CM

## 2012-03-14 LAB — CBC WITH DIFFERENTIAL/PLATELET
Basophils Relative: 0.5 % (ref 0.0–3.0)
Eosinophils Absolute: 0.1 10*3/uL (ref 0.0–0.7)
HCT: 37.5 % (ref 36.0–46.0)
Hemoglobin: 12.8 g/dL (ref 12.0–15.0)
Lymphocytes Relative: 51.6 % — ABNORMAL HIGH (ref 12.0–46.0)
Lymphs Abs: 2.9 10*3/uL (ref 0.7–4.0)
MCHC: 34.1 g/dL (ref 30.0–36.0)
MCV: 92.3 fl (ref 78.0–100.0)
Monocytes Absolute: 0.6 10*3/uL (ref 0.1–1.0)
Neutro Abs: 2 10*3/uL (ref 1.4–7.7)
RBC: 4.06 Mil/uL (ref 3.87–5.11)
RDW: 13 % (ref 11.5–14.6)

## 2012-03-14 LAB — COMPREHENSIVE METABOLIC PANEL
AST: 23 U/L (ref 0–37)
BUN: 18 mg/dL (ref 6–23)
Calcium: 9.3 mg/dL (ref 8.4–10.5)
Chloride: 104 mEq/L (ref 96–112)
Creatinine, Ser: 0.7 mg/dL (ref 0.4–1.2)
Total Bilirubin: 0.7 mg/dL (ref 0.3–1.2)

## 2012-03-14 LAB — LIPID PANEL
HDL: 56.7 mg/dL (ref >39.00)
Total CHOL/HDL Ratio: 3
Triglycerides: 61 mg/dL (ref 0.0–149.0)
VLDL: 12.2 mg/dL (ref 0.0–40.0)

## 2012-03-18 ENCOUNTER — Encounter: Payer: Self-pay | Admitting: Family Medicine

## 2012-03-18 ENCOUNTER — Ambulatory Visit (INDEPENDENT_AMBULATORY_CARE_PROVIDER_SITE_OTHER): Payer: BC Managed Care – PPO | Admitting: Family Medicine

## 2012-03-18 VITALS — BP 128/70 | HR 63 | Temp 99.0°F | Ht 62.0 in | Wt 133.2 lb

## 2012-03-18 DIAGNOSIS — Z0000 Encounter for general adult medical examination without abnormal findings: Secondary | ICD-10-CM

## 2012-03-18 NOTE — Patient Instructions (Addendum)
Since your potassium tends to run slightly low take a multivitamin a day and eat potassium rich foods  Keep up the good work with healthy diet and exercise Use sun protection to prevent skin cancer

## 2012-03-18 NOTE — Assessment & Plan Note (Signed)
Reviewed health habits including diet and exercise and skin cancer prevention Also reviewed health mt list, fam hx and immunizations  Reviewed wellness labs in detail Good cholesterol profile

## 2012-03-18 NOTE — Progress Notes (Signed)
Subjective:    Patient ID: Jamie Garza, female    DOB: 30-Aug-1955, 57 y.o.   MRN: [id number]  HPI Here for health maintenance exam and to review chronic medical problems    Has been feeling fine  No new medical problems   Wt is down 5 lb with bmi of 24 Is tax season - very busy  Manages to maintain very well - feels good at her weight   K is 3.4-baseline  Not taking any vitamins   Flu shot- had it this season - in oct   mammo 2/14 Self exam-no lumps or changes   Pap 11/12 Does not seen gyn  No abn paps  No gyn symptoms/ no bleeding / not sexually active    colonosc 8/11- 10 year recall  Lipids Lab Results  Component Value Date   CHOL 177 03/14/2012   CHOL 200 12/16/2010   CHOL 204* 12/03/2009   Lab Results  Component Value Date   HDL 56.70 03/14/2012   HDL 96.29 12/16/2010   HDL 54.80 12/03/2009   Lab Results  Component Value Date   LDLCALC 108* 03/14/2012   LDLCALC 122* 12/16/2010   LDLCALC 128* 11/14/2008   Lab Results  Component Value Date   TRIG 61.0 03/14/2012   TRIG 93.0 12/16/2010   TRIG 98.0 12/03/2009   Lab Results  Component Value Date   CHOLHDL 3 03/14/2012   CHOLHDL 3 12/16/2010   CHOLHDL 4 12/03/2009   Lab Results  Component Value Date   LDLDIRECT 137.8 12/03/2009   she goes to the gym 5 days per week - very good about it   Other labs ok   Patient Active Problem List  Diagnosis  . INSOMNIA  . SLEEP APNEA  . Routine general medical examination at a health care facility  . Gynecological examination  . Other screening mammogram  . Distal radius fracture, left  . Depression with anxiety   Past Medical History  Diagnosis Date  . Sleep apnea   . Insomnia   . Distal radius fracture, left 05/23/2011   No past surgical history on file. History  Substance Use Topics  . Smoking status: Never Smoker   . Smokeless tobacco: Not on file  . Alcohol Use: No     Comment: occasional   Family History  Problem Relation Age of Onset  .  Diabetes Mother     borderline DM  . Diabetes Maternal Aunt   . Diabetes Maternal Grandfather   . Diabetes Paternal Grandfather    No Known Allergies Current Outpatient Prescriptions on File Prior to Visit  Medication Sig Dispense Refill  . calcium citrate-vitamin D (CITRACAL+D) 315-200 MG-UNIT per tablet Take 1 tablet by mouth daily.      Marland Kitchen ibuprofen (ADVIL,MOTRIN) 800 MG tablet Take 800 mg by mouth every 8 (eight) hours as needed. pain      . LYSINE HCL PO Take 1 tablet by mouth daily.      . traZODone (DESYREL) 50 MG tablet Take 1 tablet (50 mg total) by mouth at bedtime.  30 tablet  11   No current facility-administered medications on file prior to visit.     Review of Systems Review of Systems  Constitutional: Negative for fever, appetite change, fatigue and unexpected weight change.  Eyes: Negative for pain and visual disturbance.  Respiratory: Negative for cough and shortness of breath.   Cardiovascular: Negative for cp or palpitations    Gastrointestinal: Negative for nausea, diarrhea and constipation.  Genitourinary: Negative for  urgency and frequency.  Skin: Negative for pallor or rash   MSK neg for muscle cramping  Neurological: Negative for weakness, light-headedness, numbness and headaches.  Hematological: Negative for adenopathy. Does not bruise/bleed easily.  Psychiatric/Behavioral: Negative for dysphoric mood. The patient is not nervous/anxious.         Objective:   Physical Exam  Constitutional: She appears well-developed and well-nourished. No distress.  HENT:  Head: Normocephalic and atraumatic.  Right Ear: External ear normal.  Left Ear: External ear normal.  Nose: Nose normal.  Mouth/Throat: Oropharynx is clear and moist.  Eyes: Conjunctivae and EOM are normal. Pupils are equal, round, and reactive to light. Right eye exhibits no discharge. Left eye exhibits no discharge. No scleral icterus.  Neck: Normal range of motion. Neck supple. No JVD present.  Carotid bruit is not present. No thyromegaly present.  Cardiovascular: Normal rate, regular rhythm, normal heart sounds and intact distal pulses.  Exam reveals no gallop.   Pulmonary/Chest: Effort normal and breath sounds normal. No respiratory distress. She has no wheezes.  Abdominal: Soft. Bowel sounds are normal. She exhibits no distension, no abdominal bruit and no mass. There is no tenderness.  Genitourinary: No breast swelling, tenderness, discharge or bleeding.  Breast exam: No mass, nodules, thickening, tenderness, bulging, retraction, inflamation, nipple discharge or skin changes noted.  No axillary or clavicular LA.  Chaperoned exam.    Musculoskeletal: She exhibits no edema and no tenderness.  Lymphadenopathy:    She has no cervical adenopathy.  Neurological: She is alert. She has normal reflexes. No cranial nerve deficit. She exhibits normal muscle tone. Coordination normal.  Skin: Skin is warm and dry. No rash noted. No erythema. No pallor.  Psychiatric: She has a normal mood and affect.          Assessment & Plan:

## 2013-02-03 ENCOUNTER — Telehealth: Payer: Self-pay | Admitting: Family Medicine

## 2013-02-03 NOTE — Telephone Encounter (Signed)
Pt left v/m checking status of trazodone refill.

## 2013-02-03 NOTE — Telephone Encounter (Signed)
[facility]ill through [facility]thanks

## 2013-02-03 NOTE — Telephone Encounter (Signed)
done

## 2013-02-03 NOTE — Telephone Encounter (Signed)
Electronic refill request, please advise  

## 2013-02-06 NOTE — Telephone Encounter (Signed)
Spoke with pt and advise her Rx was sent on Friday

## 2013-03-24 ENCOUNTER — Other Ambulatory Visit: Payer: BC Managed Care – PPO

## 2013-03-24 ENCOUNTER — Encounter: Payer: BC Managed Care – PPO | Admitting: Family Medicine

## 2013-07-03 ENCOUNTER — Ambulatory Visit (INDEPENDENT_AMBULATORY_CARE_PROVIDER_SITE_OTHER): Payer: BC Managed Care – PPO

## 2013-07-03 ENCOUNTER — Ambulatory Visit (INDEPENDENT_AMBULATORY_CARE_PROVIDER_SITE_OTHER): Payer: BC Managed Care – PPO | Admitting: Podiatry

## 2013-07-03 ENCOUNTER — Encounter: Payer: Self-pay | Admitting: Podiatry

## 2013-07-03 VITALS — BP 148/83 | HR 63 | Resp 16 | Ht 62.0 in | Wt 140.0 lb

## 2013-07-03 DIAGNOSIS — M779 Enthesopathy, unspecified: Secondary | ICD-10-CM

## 2013-07-03 DIAGNOSIS — M7750 Other enthesopathy of unspecified foot: Secondary | ICD-10-CM

## 2013-07-03 DIAGNOSIS — M21619 Bunion of unspecified foot: Secondary | ICD-10-CM

## 2013-07-03 DIAGNOSIS — M778 Other enthesopathies, not elsewhere classified: Secondary | ICD-10-CM

## 2013-07-03 NOTE — Progress Notes (Signed)
   Subjective:    Patient ID: Jamie Garza, female    DOB: 05-Aug-1955, 58 y.o.   MRN: [id number]  HPI Comments: Been having trouble with this right foot , it throbs in the hallux . Occasionally happens with the left as well  Foot Pain      Review of Systems  All other systems reviewed and are negative.      Objective:   Physical Exam: I have reviewed her past medical history medications allergies surgeries social history and review of systems. Pulses are strongly palpable bilateral. Neurologic sensorium is intact per since once the monofilament. Deep tendon reflexes are intact bilateral. Muscle strength is 5 over 5 dorsiflexors plantar flexors inverters everters all intrinsic musculature is intact. Orthopedic evaluation demonstrates mild hallux abductovalgus deformity of the right foot. She has pain on palpation of the first metatarsophalangeal joint of the right foot with fluid retention in the joint. She also has pain on distraction of the joint. Radiographic evaluation confirms mild hallux valgus deformity.        Assessment & Plan:  Assessment: Capsulitis with neuritis mild hallux valgus deformity right foot.  Plan: Betadine prep of the skin today with injection of 2 mg of dexamethasone. We will discuss surgical intervention again in the future.

## 2013-07-03 NOTE — Patient Instructions (Signed)
Bunion You have a bunion deformity of the feet. This is more common in women. It tends to be an inherited problem. Symptoms can include pain, swelling, and deformity around the great toe. Numbness and tingling may also be present. Your symptoms are often worsened by wearing shoes that cause pressure on the bunion. Changing the type of shoes you wear helps reduce symptoms. A wide shoe decreases pressure on the bunion. An arch support may be used if you have flat feet. Avoid shoes with heels higher than two inches. This puts more pressure on the bunion. X-rays may be helpful in evaluating the severity of the problem. Other foot problems often seen with bunions include corns, calluses, and hammer toes. If the deformity or pain is severe, surgical treatment may be necessary. Keep off your painful foot as much as possible until the pain is relieved. Call your caregiver if your symptoms are worse.  SEEK IMMEDIATE MEDICAL CARE IF:  You have increased redness, pain, swelling, or other symptoms of infection. Document Released: 01/05/2005 Document Revised: 03/30/2011 Document Reviewed: 07/05/2006 [employer] Patient Information 2014 [employer].  

## 2013-08-17 ENCOUNTER — Telehealth: Payer: Self-pay | Admitting: Family Medicine

## 2013-08-17 DIAGNOSIS — Z0000 Encounter for general adult medical examination without abnormal findings: Secondary | ICD-10-CM

## 2013-08-17 NOTE — Telephone Encounter (Signed)
Message copied by Judy PimpleWER, Yocelin Vanlue A on Thu Aug 17, 2013  1:48 PM ------      Message from: Alvina ChouWALSH, TERRI J      Created: Wed Aug 09, 2013  4:08 PM      Regarding: Lab orders for Friday, 7.31.15       Patient is scheduled for CPX labs, please order future labs, Thanks , Terri       ------

## 2013-08-18 ENCOUNTER — Other Ambulatory Visit (INDEPENDENT_AMBULATORY_CARE_PROVIDER_SITE_OTHER): Payer: BC Managed Care – PPO

## 2013-08-18 DIAGNOSIS — Z0000 Encounter for general adult medical examination without abnormal findings: Secondary | ICD-10-CM

## 2013-08-18 LAB — CBC WITH DIFFERENTIAL/PLATELET
BASOS ABS: 0 10*3/uL (ref 0.0–0.1)
Basophils Relative: 0.5 % (ref 0.0–3.0)
EOS ABS: 0.1 10*3/uL (ref 0.0–0.7)
EOS PCT: 2.4 % (ref 0.0–5.0)
HCT: 36.5 % (ref 36.0–46.0)
Hemoglobin: 12.4 g/dL (ref 12.0–15.0)
LYMPHS PCT: 41.3 % (ref 12.0–46.0)
Lymphs Abs: 2.4 10*3/uL (ref 0.7–4.0)
MCHC: 33.8 g/dL (ref 30.0–36.0)
MCV: 93.8 fl (ref 78.0–100.0)
Monocytes Absolute: 0.6 10*3/uL (ref 0.1–1.0)
Monocytes Relative: 10.8 % (ref 3.0–12.0)
NEUTROS PCT: 45 % (ref 43.0–77.0)
Neutro Abs: 2.6 10*3/uL (ref 1.4–7.7)
PLATELETS: 212 10*3/uL (ref 150.0–400.0)
RBC: 3.9 Mil/uL (ref 3.87–5.11)
RDW: 13.4 % (ref 11.5–15.5)
WBC: 5.9 10*3/uL (ref 4.0–10.5)

## 2013-08-18 LAB — LIPID PANEL
CHOL/HDL RATIO: 4
Cholesterol: 205 mg/dL — ABNORMAL HIGH (ref 0–200)
HDL: 58.4 mg/dL (ref >39.00)
LDL Cholesterol: 129 mg/dL — ABNORMAL HIGH (ref 0–99)
NONHDL: 146.6
Triglycerides: 86 mg/dL (ref 0.0–149.0)
VLDL: 17.2 mg/dL (ref 0.0–40.0)

## 2013-08-18 LAB — COMPREHENSIVE METABOLIC PANEL
ALBUMIN: 3.8 g/dL (ref 3.5–5.2)
ALK PHOS: 59 U/L (ref 39–117)
ALT: 19 U/L (ref 0–35)
AST: 25 U/L (ref 0–37)
BUN: 18 mg/dL (ref 6–23)
CALCIUM: 9.3 mg/dL (ref 8.4–10.5)
CHLORIDE: 108 meq/L (ref 96–112)
CO2: 28 meq/L (ref 19–32)
Creatinine, Ser: 0.7 mg/dL (ref 0.4–1.2)
GFR: 86.77 mL/min (ref >60.00)
Glucose, Bld: 97 mg/dL (ref 70–99)
Potassium: 3.7 mEq/L (ref 3.5–5.1)
SODIUM: 140 meq/L (ref 135–145)
TOTAL PROTEIN: 7 g/dL (ref 6.0–8.3)
Total Bilirubin: 0.7 mg/dL (ref 0.2–1.2)

## 2013-08-18 LAB — TSH: TSH: 1.81 u[IU]/mL (ref 0.35–4.50)

## 2013-08-23 ENCOUNTER — Other Ambulatory Visit (HOSPITAL_COMMUNITY)
Admission: RE | Admit: 2013-08-23 | Discharge: 2013-08-23 | Disposition: A | Discharge status: Home / Self Care | Payer: BC Managed Care – PPO | Source: Ambulatory Visit | Attending: Family Medicine | Admitting: Family Medicine

## 2013-08-23 ENCOUNTER — Encounter: Payer: Self-pay | Admitting: Family Medicine

## 2013-08-23 ENCOUNTER — Ambulatory Visit (INDEPENDENT_AMBULATORY_CARE_PROVIDER_SITE_OTHER): Payer: BC Managed Care – PPO | Admitting: Family Medicine

## 2013-08-23 VITALS — BP 120/80 | HR 55 | Temp 98.4°F | Ht 62.0 in | Wt 147.2 lb

## 2013-08-23 DIAGNOSIS — Z01419 Encounter for gynecological examination (general) (routine) without abnormal findings: Secondary | ICD-10-CM

## 2013-08-23 DIAGNOSIS — Z0000 Encounter for general adult medical examination without abnormal findings: Secondary | ICD-10-CM

## 2013-08-23 NOTE — Progress Notes (Signed)
Pre visit review using our clinic review tool, if applicable. No additional management support is needed unless otherwise documented below in the visit note. 

## 2013-08-23 NOTE — Patient Instructions (Signed)
Continue taking care of yourself Try to eat a healthy balanced diet and keep up the good exercise Pap today  Don't forget to schedule your mammogram

## 2013-08-23 NOTE — Progress Notes (Signed)
Subjective:    Patient ID: Jamie Garza, female    DOB: [birth year], 58 y.o.   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   Feeling good   Wt is up 7 lb with bmi of 26 Plans to go back on wt watchers She stress eats -duing tax season  She does exercise in the mornings    Flu vaccine -had in the fall (can get it free)  Will get one in the coming fall   Pap 11/12  Will do that today  No gyn problems  Td 11/10   colonosc 8/11 - 10 year recall /normal   She will [facility] mamm[facility] after this visit  No lumps on self exam     Results for orders placed in visit on 08/18/13  COMPREHENSIVE METABOLIC PANEL      Result Value Ref Range   Sodium 140  135 - 145 mEq/L   Potassium 3.7  3.5 - 5.1 mEq/L   Chloride 108  96 - 112 mEq/L   CO2 28  19 - 32 mEq/L   Glucose, Bld 97  70 - 99 mg/dL   BUN 18  6 - 23 mg/dL   Creatinine, Ser 0.7  0.4 - 1.2 mg/dL   Total Bilirubin 0.7  0.2 - 1.2 mg/dL   Alkaline Phosphatase 59  39 - 117 U/L   AST 25  0 - 37 U/L   ALT 19  0 - 35 U/L   Total Protein 7.0  6.0 - 8.3 g/dL   Albumin 3.8  3.5 - 5.2 g/dL   Calcium 9.3  8.4 - 78.210.5 mg/dL   GFR 95.6286.77  >13.08>60.00 mL/min  CBC WITH DIFFERENTIAL      Result Value Ref Range   WBC 5.9  4.0 - 10.5 K/uL   RBC 3.90  3.87 - 5.11 Mil/uL   Hemoglobin 12.4  12.0 - 15.0 g/dL   HCT 65.736.5  84.636.0 - 96.246.0 %   MCV 93.8  78.0 - 100.0 fl   MCHC 33.8  30.0 - 36.0 g/dL   RDW 95.213.4  84.111.5 - 32.415.5 %   Platelets 212.0  150.0 - 400.0 K/uL   Neutrophils Relative % 45.0  43.0 - 77.0 %   Lymphocytes Relative 41.3  12.0 - 46.0 %   Monocytes Relative 10.8  3.0 - 12.0 %   Eosinophils Relative 2.4  0.0 - 5.0 %   Basophils Relative 0.5  0.0 - 3.0 %   Neutro Abs 2.6  1.4 - 7.7 K/uL   Lymphs Abs 2.4  0.7 - 4.0 K/uL   Monocytes Absolute 0.6  0.1 - 1.0 K/uL   Eosinophils Absolute 0.1  0.0 - 0.7 K/uL     Basophils Absolute 0.0  0.0 - 0.1 K/uL  TSH      Result Value Ref Range   TSH 1.81  0.35 - 4.50 uIU/mL  LIPID PANEL      Result Value Ref Range   Cholesterol 205 (*) 0 - 200 mg/dL   Triglycerides 40.186.0  0.0 - 149.0 mg/dL   HDL 02.7258.40  >53.66>39.00 mg/dL   VLDL 44.017.2  0.0 - 34.740.0 mg/dL   LDL Cholesterol 425129 (*) 0 - 99 mg/dL   Total CHOL/HDL Ratio 4     NonHDL 146.60     wants to get back to  healthy eating    Patient Active Problem List   Diagnosis Date Noted  . Depression with anxiety 10/30/2011  . Distal radius fracture, left 05/23/2011  . Encounter for routine gynecological examination 12/19/2010  . Other screening mammogram 12/19/2010  . Routine general medical examination at a health care facility 12/15/2010  . INSOMNIA 11/20/2008  . SLEEP APNEA 01/04/2007   Past Medical History  Diagnosis Date  . Sleep apnea   . Insomnia   . Distal radius fracture, left 05/23/2011   Past Surgical History  Procedure Laterality Date  . Wrist fracture surgery     History  Substance Use Topics  . Smoking status: Never Smoker   . Smokeless tobacco: Never Used  . Alcohol Use: No     Comment: occasional   Family History  Problem Relation Age of Onset  . Diabetes Mother     borderline DM  . Diabetes Maternal Aunt   . Diabetes Maternal Grandfather   . Diabetes Paternal Grandfather    No Known Allergies Current Outpatient Prescriptions on File Prior to Visit  Medication Sig Dispense Refill  . LYSINE HCL PO Take 1 tablet by mouth daily.      . calcium citrate-vitamin D (CITRACAL+D) 315-200 MG-UNIT per tablet Take 1 tablet by mouth daily.      Marland Kitchen ibuprofen (ADVIL,MOTRIN) 800 MG tablet Take 800 mg by mouth every 8 (eight) hours as needed. pain       No current facility-administered medications on file prior to visit.     Review of Systems    Review of Systems  Constitutional: Negative for fever, appetite change, fatigue and unexpected weight change.  Eyes: Negative for pain and visual  disturbance.  Respiratory: Negative for cough and shortness of breath.   Cardiovascular: Negative for cp or palpitations    Gastrointestinal: Negative for nausea, diarrhea and constipation.  Genitourinary: Negative for urgency and frequency.  Skin: Negative for pallor or rash   Neurological: Negative for weakness, light-headedness, numbness and headaches.  Hematological: Negative for adenopathy. Does not bruise/bleed easily.  Psychiatric/Behavioral: Negative for dysphoric mood. The patient is not nervous/anxious.  pos for stressors     Objective:   Physical Exam  Constitutional: She appears well-developed and well-nourished. No distress.  overwt and well app  HENT:  Head: Normocephalic and atraumatic.  Right Ear: External ear normal.  Left Ear: External ear normal.  Mouth/Throat: Oropharynx is clear and moist.  Eyes: Conjunctivae and EOM are normal. Pupils are equal, round, and reactive to light. No scleral icterus.  Neck: Normal range of motion. Neck supple. No JVD present. Carotid bruit is not present. No thyromegaly present.  Cardiovascular: Normal rate, regular rhythm, normal heart sounds and intact distal pulses.  Exam reveals no gallop.   Pulmonary/Chest: Effort normal and breath sounds normal. No respiratory distress. She has no wheezes. She exhibits no tenderness.  Abdominal: Soft. Bowel sounds are normal. She exhibits no distension, no abdominal bruit and no mass. There is no tenderness.  Genitourinary: No breast swelling, tenderness, discharge or bleeding. There is no rash, tenderness or lesion on the right labia. There is no rash, tenderness or lesion on the left labia. Uterus is not enlarged and not tender. Cervix exhibits no motion tenderness, no discharge and no friability. Right adnexum displays no mass, no tenderness and no fullness. Left adnexum displays no mass, no tenderness and no fullness. No erythema or bleeding around the vagina.  Breast exam: No mass, nodules,  thickening, tenderness, bulging, retraction, inflamation, nipple  discharge or skin changes noted.  No axillary or clavicular LA.      Musculoskeletal: Normal range of motion. She exhibits no edema and no tenderness.  Lymphadenopathy:    She has no cervical adenopathy.  Neurological: She is alert. She has normal reflexes. No cranial nerve deficit. She exhibits normal muscle tone. Coordination normal.  Skin: Skin is warm and dry. No rash noted. No erythema. No pallor.  Psychiatric: She has a normal mood and affect.          Assessment & Plan:   Problem List Items Addressed This Visit     Other   Routine general medical examination at a health care facility - Primary     Reviewed health habits including diet and exercise and skin cancer prevention Reviewed appropriate screening tests for age  Also reviewed health mt list, fam hx and immunization status , as well as social and family history   See HPI Labs rev Pt will schedule her own mammogram     Encounter for routine gynecological examination     Exam with pap  No problems or c/o    Relevant Orders      Cytology - PAP

## 2013-08-24 NOTE — Assessment & Plan Note (Signed)
Exam with pap  No problems or c/o

## 2013-08-24 NOTE — Assessment & Plan Note (Signed)
Reviewed health habits including diet and exercise and skin cancer prevention Reviewed appropriate screening tests for age  Also reviewed health mt list, fam hx and immunization status , as well as social and family history   See HPI Labs rev Pt will schedule her own mammogram

## 2013-08-28 LAB — CYTOLOGY - PAP

## 2013-08-29 ENCOUNTER — Encounter: Payer: Self-pay | Admitting: *Deleted

## 2013-09-22 ENCOUNTER — Ambulatory Visit: Payer: Self-pay | Admitting: Family Medicine

## 2013-09-26 ENCOUNTER — Encounter: Payer: Self-pay | Admitting: Family Medicine

## 2013-11-08 ENCOUNTER — Ambulatory Visit: Payer: BC Managed Care – PPO | Admitting: Family Medicine

## 2014-08-19 ENCOUNTER — Telehealth: Payer: Self-pay | Admitting: Family Medicine

## 2014-08-19 DIAGNOSIS — Z0000 Encounter for general adult medical examination without abnormal findings: Secondary | ICD-10-CM

## 2014-08-19 NOTE — Telephone Encounter (Signed)
-----   Message from Alvina Chou sent at 08/10/2014 11:00 AM EDT ----- Regarding: Lab orders for Monday,8.1.16 Patient is scheduled for CPX labs, please order future labs, Thanks , Camelia Eng

## 2014-08-20 ENCOUNTER — Other Ambulatory Visit (INDEPENDENT_AMBULATORY_CARE_PROVIDER_SITE_OTHER): Payer: Commercial Managed Care - HMO

## 2014-08-20 DIAGNOSIS — Z0000 Encounter for general adult medical examination without abnormal findings: Secondary | ICD-10-CM

## 2014-08-20 LAB — COMPREHENSIVE METABOLIC PANEL
ALBUMIN: 3.9 g/dL (ref 3.5–5.2)
ALT: 15 U/L (ref 0–35)
AST: 20 U/L (ref 0–37)
Alkaline Phosphatase: 67 U/L (ref 39–117)
BUN: 16 mg/dL (ref 6–23)
CO2: 27 meq/L (ref 19–32)
Calcium: 9.5 mg/dL (ref 8.4–10.5)
Chloride: 106 mEq/L (ref 96–112)
Creatinine, Ser: 0.63 mg/dL (ref 0.40–1.20)
GFR: 102.5 mL/min (ref >60.00)
GLUCOSE: 92 mg/dL (ref 70–99)
POTASSIUM: 4.6 meq/L (ref 3.5–5.1)
SODIUM: 141 meq/L (ref 135–145)
TOTAL PROTEIN: 7.2 g/dL (ref 6.0–8.3)
Total Bilirubin: 0.4 mg/dL (ref 0.2–1.2)

## 2014-08-20 LAB — LIPID PANEL
CHOLESTEROL: 202 mg/dL — AB (ref 0–200)
HDL: 55.2 mg/dL (ref >39.00)
LDL Cholesterol: 127 mg/dL — ABNORMAL HIGH (ref 0–99)
NonHDL: 146.34
Total CHOL/HDL Ratio: 4
Triglycerides: 99 mg/dL (ref 0.0–149.0)
VLDL: 19.8 mg/dL (ref 0.0–40.0)

## 2014-08-20 LAB — CBC WITH DIFFERENTIAL/PLATELET
Basophils Absolute: 0 10*3/uL (ref 0.0–0.1)
Basophils Relative: 0.5 % (ref 0.0–3.0)
EOS ABS: 0.2 10*3/uL (ref 0.0–0.7)
Eosinophils Relative: 2.7 % (ref 0.0–5.0)
HCT: 34.6 % — ABNORMAL LOW (ref 36.0–46.0)
HEMOGLOBIN: 11.7 g/dL — AB (ref 12.0–15.0)
LYMPHS PCT: 46 % (ref 12.0–46.0)
Lymphs Abs: 2.7 10*3/uL (ref 0.7–4.0)
MCHC: 33.9 g/dL (ref 30.0–36.0)
MCV: 91.3 fl (ref 78.0–100.0)
MONO ABS: 0.7 10*3/uL (ref 0.1–1.0)
Monocytes Relative: 11.4 % (ref 3.0–12.0)
Neutro Abs: 2.3 10*3/uL (ref 1.4–7.7)
Neutrophils Relative %: 39.4 % — ABNORMAL LOW (ref 43.0–77.0)
PLATELETS: 239 10*3/uL (ref 150.0–400.0)
RBC: 3.79 Mil/uL — ABNORMAL LOW (ref 3.87–5.11)
RDW: 13.5 % (ref 11.5–15.5)
WBC: 5.9 10*3/uL (ref 4.0–10.5)

## 2014-08-20 LAB — TSH: TSH: 2.01 u[IU]/mL (ref 0.35–4.50)

## 2014-08-23 ENCOUNTER — Other Ambulatory Visit: Payer: Self-pay | Admitting: Family Medicine

## 2014-08-23 DIAGNOSIS — Z1231 Encounter for screening mammogram for malignant neoplasm of breast: Secondary | ICD-10-CM

## 2014-08-27 ENCOUNTER — Ambulatory Visit
Admission: RE | Admit: 2014-08-27 | Discharge: 2014-08-27 | Disposition: A | Discharge status: Home / Self Care | Payer: Commercial Managed Care - HMO | Source: Ambulatory Visit | Attending: Family Medicine | Admitting: Family Medicine

## 2014-08-27 ENCOUNTER — Encounter: Payer: Self-pay | Admitting: Family Medicine

## 2014-08-27 ENCOUNTER — Ambulatory Visit (INDEPENDENT_AMBULATORY_CARE_PROVIDER_SITE_OTHER): Payer: Commercial Managed Care - HMO | Admitting: Family Medicine

## 2014-08-27 VITALS — BP 118/78 | HR 55 | Temp 98.9°F | Ht 61.75 in | Wt 150.8 lb

## 2014-08-27 DIAGNOSIS — Z1231 Encounter for screening mammogram for malignant neoplasm of breast: Secondary | ICD-10-CM | POA: Insufficient documentation

## 2014-08-27 DIAGNOSIS — Z8781 Personal history of (healed) traumatic fracture: Secondary | ICD-10-CM

## 2014-08-27 DIAGNOSIS — G4700 Insomnia, unspecified: Secondary | ICD-10-CM

## 2014-08-27 DIAGNOSIS — Z0000 Encounter for general adult medical examination without abnormal findings: Secondary | ICD-10-CM

## 2014-08-27 LAB — HM MAMMOGRAPHY: HM Mammogram: NORMAL

## 2014-08-27 MED ORDER — TRAZODONE HCL 50 MG PO TABS: 50.0000 mg | ORAL_TABLET | Freq: Every day | ORAL | Status: DC

## 2014-08-27 NOTE — Patient Instructions (Signed)
Start back on trazadone for insomnia - if any side effects or problems please let me know Get a flu shot in the fall  Continue your calcium and vitamin D

## 2014-08-27 NOTE — Assessment & Plan Note (Signed)
Reviewed health habits including diet and exercise and skin cancer prevention Reviewed appropriate screening tests for age  Also reviewed health mt list, fam hx and immunization status , as well as social and family history   See HPI Labs reviewed Pt has annual mammogram planned today  Also dermatology visit upcoming Enc further exercise

## 2014-08-27 NOTE — Progress Notes (Signed)
Subjective:    Patient ID: Jamie Garza, female    DOB: [birth year], 59 y.o.   MRN: [id number]  HPI Here for health maintenance exam and to review chronic medical problems    Changed jobs at the end of the year - happy with the decision  Went back to a job she had years ago - less stress and less overtime  Going to [employer] for her 59th birthday -looking forward to it   Taking care of herself   Wt is up 3 lb - is exercising every day/ thinks she eats too much/eat healthier  bmi of 27  Hep C/ HIV screening - declines / she gets checked when she donates blood and platelets (monthly)   Flu shot - had one last season   Mammogram 9/15 nl - she has her mammogram scheduled today at [facility] Self exam-no lumps   Pap 8/15 - normal  No gyn symptoms   Td 11/10  Colonoscopy 8/11 nl with 10 year recall  Has trouble staying asleep- wants to go back on trazadone  Arthritis pain bothers her - cannot get comfortable (chiropractor helps)  Melatonin may help a little   Takes aleve occ at bedtime Also started on osteo bi flex   Results for orders placed or performed in visit on 08/20/14  Comprehensive metabolic panel  Result Value Ref Range   Sodium 141 135 - 145 mEq/L   Potassium 4.6 3.5 - 5.1 mEq/L   Chloride 106 96 - 112 mEq/L   CO2 27 19 - 32 mEq/L   Glucose, Bld 92 70 - 99 mg/dL   BUN 16 6 - 23 mg/dL   Creatinine, Ser 1.19 0.40 - 1.20 mg/dL   Total Bilirubin 0.4 0.2 - 1.2 mg/dL   Alkaline Phosphatase 67 39 - 117 U/L   AST 20 0 - 37 U/L   ALT 15 0 - 35 U/L   Total Protein 7.2 6.0 - 8.3 g/dL   Albumin 3.9 3.5 - 5.2 g/dL   Calcium 9.5 8.4 - 14.7 mg/dL   GFR 829.56 >21.30 mL/min  CBC with Differential/Platelet  Result Value Ref Range   WBC 5.9 4.0 - 10.5 K/uL   RBC 3.79 (L) 3.87 - 5.11 Mil/uL   Hemoglobin 11.7 (L) 12.0 - 15.0 g/dL   HCT 86.5 (L) 78.4 - 69.6 %   MCV 91.3 78.0 - 100.0 fl   MCHC 33.9 30.0 - 36.0 g/dL   RDW 29.5 28.4 - 13.2 %   Platelets 239.0 150.0 - 400.0  K/uL   Neutrophils Relative % 39.4 (L) 43.0 - 77.0 %   Lymphocytes Relative 46.0 12.0 - 46.0 %   Monocytes Relative 11.4 3.0 - 12.0 %   Eosinophils Relative 2.7 0.0 - 5.0 %   Basophils Relative 0.5 0.0 - 3.0 %   Neutro Abs 2.3 1.4 - 7.7 K/uL   Lymphs Abs 2.7 0.7 - 4.0 K/uL   Monocytes Absolute 0.7 0.1 - 1.0 K/uL   Eosinophils Absolute 0.2 0.0 - 0.7 K/uL   Basophils Absolute 0.0 0.0 - 0.1 K/uL  Lipid panel  Result Value Ref Range   Cholesterol 202 (H) 0 - 200 mg/dL   Triglycerides 44.0 0.0 - 149.0 mg/dL   HDL 10.27 >25.36 mg/dL   VLDL 64.4 0.0 - 03.4 mg/dL   LDL Cholesterol 742 (H) 0 - 99 mg/dL   Total CHOL/HDL Ratio 4    NonHDL 146.34   TSH  Result Value Ref Range   TSH 2.01 0.35 -  4.50 uIU/mL   donates blood - frequently - her Hb does fluctuates   Lab Results  Component Value Date   CHOL 202* 08/20/2014   CHOL 205* 08/18/2013   CHOL 177 03/14/2012   Lab Results  Component Value Date   HDL 55.20 08/20/2014   HDL 58.40 08/18/2013   HDL 56.70 03/14/2012   Lab Results  Component Value Date   LDLCALC 127* 08/20/2014   LDLCALC 129* 08/18/2013   LDLCALC 108* 03/14/2012   Lab Results  Component Value Date   TRIG 99.0 08/20/2014   TRIG 86.0 08/18/2013   TRIG 61.0 03/14/2012   Lab Results  Component Value Date   CHOLHDL 4 08/20/2014   CHOLHDL 4 08/18/2013   CHOLHDL 3 03/14/2012   Lab Results  Component Value Date   LDLDIRECT 137.8 12/03/2009   very stable from last year    Review of Systems Review of Systems  Constitutional: Negative for fever, appetite change, fatigue and unexpected weight change.  Eyes: Negative for pain and visual disturbance.  ENT pos for occ globus sens (has large tonsils) Respiratory: Negative for cough and shortness of breath.   Cardiovascular: Negative for cp or palpitations    Gastrointestinal: Negative for nausea, diarrhea and constipation.  Genitourinary: Negative for urgency and frequency.  Skin: Negative for pallor or rash     Neurological: Negative for weakness, light-headedness, numbness and headaches.  Hematological: Negative for adenopathy. Does not bruise/bleed easily.  Psychiatric/Behavioral: Negative for dysphoric mood. The patient is not nervous/anxious.  pos for insomnia        Objective:   Physical Exam  Constitutional: She appears well-developed and well-nourished. No distress.  HENT:  Head: Normocephalic and atraumatic.  Right Ear: External ear normal.  Left Ear: External ear normal.  Nose: Nose normal.  Mouth/Throat: Oropharynx is clear and moist.  Eyes: Conjunctivae and EOM are normal. Pupils are equal, round, and reactive to light. Right eye exhibits no discharge. Left eye exhibits no discharge. No scleral icterus.  Neck: Normal range of motion. Neck supple. No JVD present. Carotid bruit is not present. No thyromegaly present.  Cardiovascular: Normal rate, regular rhythm, normal heart sounds and intact distal pulses.  Exam reveals no gallop.   Pulmonary/Chest: Effort normal and breath sounds normal. No respiratory distress. She has no wheezes. She has no rales.  Abdominal: Soft. Bowel sounds are normal. She exhibits no distension and no mass. There is no tenderness.  Genitourinary: No breast swelling, tenderness, discharge or bleeding.  Breast exam: No mass, nodules, thickening, tenderness, bulging, retraction, inflamation, nipple discharge or skin changes noted.  No axillary or clavicular LA.      Musculoskeletal: She exhibits no edema or tenderness.  Lymphadenopathy:    She has no cervical adenopathy.  Neurological: She is alert. She has normal reflexes. No cranial nerve deficit. She exhibits normal muscle tone. Coordination normal.  Skin: Skin is warm and dry. No rash noted. No erythema. No pallor.  Solar lentigos diffusely   Psychiatric: She has a normal mood and affect.          Assessment & Plan:   Problem List Items Addressed This Visit    History of radius fracture   Insomnia     Pt wants to go back on trazadone for trouble staying asleep  Did well on it w/o side eff in the past  Px written Update if any issues or side eff       Routine general medical examination at a health care facility -  Primary    Reviewed health habits including diet and exercise and skin cancer prevention Reviewed appropriate screening tests for age  Also reviewed health mt list, fam hx and immunization status , as well as social and family history   See HPI Labs reviewed Pt has annual mammogram planned today  Also dermatology visit upcoming Enc further exercise

## 2014-08-27 NOTE — Progress Notes (Signed)
Pre visit review using our clinic review tool, if applicable. No additional management support is needed unless otherwise documented below in the visit note. 

## 2014-08-27 NOTE — Assessment & Plan Note (Signed)
Pt wants to go back on trazadone for trouble staying asleep  Did well on it w/o side eff in the past  Px written Update if any issues or side eff

## 2014-08-29 ENCOUNTER — Encounter: Payer: Self-pay | Admitting: *Deleted

## 2014-08-29 ENCOUNTER — Encounter: Payer: Self-pay | Admitting: Family Medicine

## 2014-10-30 ENCOUNTER — Encounter: Payer: Self-pay | Admitting: Gastroenterology

## 2015-08-18 ENCOUNTER — Telehealth: Payer: Self-pay | Admitting: Family Medicine

## 2015-08-18 DIAGNOSIS — Z0000 Encounter for general adult medical examination without abnormal findings: Secondary | ICD-10-CM

## 2015-08-18 NOTE — Telephone Encounter (Signed)
-----   Message from Baldomero Lamy sent at 08/13/2015  1:25 PM EDT ----- Regarding: Cpx labs Wed 8/2, need orders. Thanks! :-) Please order  future cpx labs for pt's upcoming lab appt. Thanks Rodney Booze

## 2015-08-21 ENCOUNTER — Other Ambulatory Visit (INDEPENDENT_AMBULATORY_CARE_PROVIDER_SITE_OTHER): Payer: Commercial Managed Care - HMO

## 2015-08-21 ENCOUNTER — Other Ambulatory Visit: Payer: Self-pay | Admitting: Family Medicine

## 2015-08-21 DIAGNOSIS — Z1231 Encounter for screening mammogram for malignant neoplasm of breast: Secondary | ICD-10-CM

## 2015-08-21 DIAGNOSIS — Z0000 Encounter for general adult medical examination without abnormal findings: Secondary | ICD-10-CM

## 2015-08-21 LAB — COMPREHENSIVE METABOLIC PANEL
ALK PHOS: 67 U/L (ref 39–117)
ALT: 15 U/L (ref 0–35)
AST: 21 U/L (ref 0–37)
Albumin: 4 g/dL (ref 3.5–5.2)
BUN: 14 mg/dL (ref 6–23)
CALCIUM: 9.6 mg/dL (ref 8.4–10.5)
CO2: 27 mEq/L (ref 19–32)
Chloride: 102 mEq/L (ref 96–112)
Creatinine, Ser: 0.71 mg/dL (ref 0.40–1.20)
GFR: 88.99 mL/min (ref >60.00)
Glucose, Bld: 92 mg/dL (ref 70–99)
Potassium: 4.2 mEq/L (ref 3.5–5.1)
Sodium: 138 mEq/L (ref 135–145)
Total Bilirubin: 0.4 mg/dL (ref 0.2–1.2)
Total Protein: 7.6 g/dL (ref 6.0–8.3)

## 2015-08-21 LAB — CBC WITH DIFFERENTIAL/PLATELET
BASOS ABS: 0 10*3/uL (ref 0.0–0.1)
BASOS PCT: 0.6 % (ref 0.0–3.0)
Eosinophils Absolute: 0.1 10*3/uL (ref 0.0–0.7)
Eosinophils Relative: 1.9 % (ref 0.0–5.0)
HEMATOCRIT: 35.7 % — AB (ref 36.0–46.0)
HEMOGLOBIN: 11.8 g/dL — AB (ref 12.0–15.0)
Lymphocytes Relative: 47.8 % — ABNORMAL HIGH (ref 12.0–46.0)
Lymphs Abs: 2.7 10*3/uL (ref 0.7–4.0)
MCHC: 33 g/dL (ref 30.0–36.0)
MCV: 83.8 fl (ref 78.0–100.0)
MONOS PCT: 11.3 % (ref 3.0–12.0)
Monocytes Absolute: 0.7 10*3/uL (ref 0.1–1.0)
NEUTROS ABS: 2.2 10*3/uL (ref 1.4–7.7)
Neutrophils Relative %: 38.4 % — ABNORMAL LOW (ref 43.0–77.0)
PLATELETS: 243 10*3/uL (ref 150.0–400.0)
RBC: 4.26 Mil/uL (ref 3.87–5.11)
RDW: 15.4 % (ref 11.5–15.5)
WBC: 5.8 10*3/uL (ref 4.0–10.5)

## 2015-08-21 LAB — LIPID PANEL
CHOLESTEROL: 203 mg/dL — AB (ref 0–200)
HDL: 58.5 mg/dL (ref >39.00)
LDL Cholesterol: 127 mg/dL — ABNORMAL HIGH (ref 0–99)
NonHDL: 144.85
TRIGLYCERIDES: 87 mg/dL (ref 0.0–149.0)
Total CHOL/HDL Ratio: 3
VLDL: 17.4 mg/dL (ref 0.0–40.0)

## 2015-08-21 LAB — TSH: TSH: 2 u[IU]/mL (ref 0.35–4.50)

## 2015-08-28 ENCOUNTER — Ambulatory Visit (INDEPENDENT_AMBULATORY_CARE_PROVIDER_SITE_OTHER): Payer: Commercial Managed Care - HMO | Admitting: Family Medicine

## 2015-08-28 ENCOUNTER — Encounter: Payer: Self-pay | Admitting: Family Medicine

## 2015-08-28 VITALS — BP 112/70 | HR 62 | Temp 98.7°F | Ht 61.75 in | Wt 150.5 lb

## 2015-08-28 DIAGNOSIS — Z0000 Encounter for general adult medical examination without abnormal findings: Secondary | ICD-10-CM | POA: Diagnosis not present

## 2015-08-28 DIAGNOSIS — G4700 Insomnia, unspecified: Secondary | ICD-10-CM

## 2015-08-28 MED ORDER — TRAZODONE HCL 50 MG PO TABS: 50.0000 mg | ORAL_TABLET | Freq: Every day | ORAL | 3 refills | Status: DC

## 2015-08-28 NOTE — Progress Notes (Signed)
Subjective:    Patient ID: Jamie Garza, female    DOB: Mar 08, 1955, 60 y.o.   MRN: [id number]  HPI  Here for health maintenance exam and to review chronic medical problems    Doing ok overall  Job is stressful  No plans to retire   Wt Readings from Last 3 Encounters:  08/28/15 150 lb 8 oz (68.3 kg)  08/27/14 150 lb 12 oz (68.4 kg)  08/23/13 147 lb 4 oz (66.8 kg)   bmi is 27.5 Still exercises but needs to eat better   BP Readings from Last 3 Encounters:  08/28/15 112/70  08/27/14 118/78  08/23/13 120/80     dexa 6/13 normal   Pap 8/15 normal No new partners  No hx of abn paps or HPV  No gyn problems   Mammogram 8/16-neg - it is planned next week at [facility]  Self breast exam - no lumps   8/11 colonoscopy-nl with 10 year recall / has tics   Zoster vaccine -is interested - will see if it is covered    Flu shots - gets them every fall   Hep C/ HIV screen -she is screened when she gives blood regularly   Td 11/10   Lab Results  Component Value Date   WBC 5.8 08/21/2015   HGB 11.8 (L) 08/21/2015   HCT 35.7 (L) 08/21/2015   MCV 83.8 08/21/2015   PLT 243.0 08/21/2015   because she gives blood often     Chemistry      Component Value Date/Time   NA 138 08/21/2015 0755   K 4.2 08/21/2015 0755   CL 102 08/21/2015 0755   CO2 27 08/21/2015 0755   BUN 14 08/21/2015 0755   CREATININE 0.71 08/21/2015 0755      Component Value Date/Time   CALCIUM 9.6 08/21/2015 0755   ALKPHOS 67 08/21/2015 0755   AST 21 08/21/2015 0755   ALT 15 08/21/2015 0755   BILITOT 0.4 08/21/2015 0755     glucose 92  Lab Results  Component Value Date   TSH 2.00 08/21/2015    Cholesterol  Lab Results  Component Value Date   CHOL 203 (H) 08/21/2015   CHOL 202 (H) 08/20/2014   CHOL 205 (H) 08/18/2013   Lab Results  Component Value Date   HDL 58.50 08/21/2015   HDL 55.20 08/20/2014   HDL 58.40 08/18/2013   Lab Results  Component Value Date   LDLCALC 127 (H) 08/21/2015   LDLCALC 127 (H) 08/20/2014   LDLCALC 129 (H) 08/18/2013   Lab Results  Component Value Date   TRIG 87.0 08/21/2015   TRIG 99.0 08/20/2014   TRIG 86.0 08/18/2013   Lab Results  Component Value Date   CHOLHDL 3 08/21/2015   CHOLHDL 4 08/20/2014   CHOLHDL 4 08/18/2013   Lab Results  Component Value Date   LDLDIRECT 137.8 12/03/2009   slightly improved  Is watching diet somewhat    Patient Active Problem List   Diagnosis Date Noted  . Depression with anxiety 10/30/2011  . History of radius fracture 05/23/2011  . Encounter for routine gynecological examination 12/19/2010  . Other screening mammogram 12/19/2010  . Routine general medical examination at a health care facility 12/15/2010  . Insomnia 11/20/2008  . SLEEP APNEA 01/04/2007   Past Medical History:  Diagnosis Date  . Distal radius fracture, left 05/23/2011  . Insomnia   . Sleep apnea    Past Surgical History:  Procedure Laterality Date  . WRIST FRACTURE  SURGERY     Social History  Substance Use Topics  . Smoking status: Never Smoker  . Smokeless tobacco: Never Used  . Alcohol use 0.0 oz/week     Comment: occasional   Family History  Problem Relation Age of Onset  . Diabetes Mother     borderline DM  . Diabetes Maternal Aunt   . Diabetes Maternal Grandfather   . Diabetes Paternal Grandfather    No Known Allergies Current Outpatient Prescriptions on File Prior to Visit  Medication Sig Dispense Refill  . LYSINE HCL PO Take 1 tablet by mouth daily.    . naproxen sodium (ALEVE) 220 MG tablet Take 220 mg by mouth 2 (two) times daily with a meal.    . Probiotic Product (PROBIOTIC DAILY PO) Take 1 capsule by mouth daily.     No current facility-administered medications on file prior to visit.     Review of Systems Review of Systems  Constitutional: Negative for fever, appetite change, fatigue and unexpected weight change.  Eyes: Negative for pain and visual disturbance.  Respiratory: Negative for cough  and shortness of breath.   Cardiovascular: Negative for cp or palpitations    Gastrointestinal: Negative for nausea, diarrhea and constipation.  Genitourinary: Negative for urgency and frequency.  Skin: Negative for pallor or rash   Neurological: Negative for weakness, light-headedness, numbness and headaches.  Hematological: Negative for adenopathy. Does not bruise/bleed easily.  Psychiatric/Behavioral: Negative for dysphoric mood. The patient is not nervous/anxious.         Objective:   Physical Exam  Constitutional: She appears well-developed and well-nourished. No distress.  overwt and well app  HENT:  Head: Normocephalic and atraumatic.  Right Ear: External ear normal.  Left Ear: External ear normal.  Mouth/Throat: Oropharynx is clear and moist.  Eyes: Conjunctivae and EOM are normal. Pupils are equal, round, and reactive to light. No scleral icterus.  Neck: Normal range of motion. Neck supple. No JVD present. Carotid bruit is not present. No thyromegaly present.  Cardiovascular: Normal rate, regular rhythm, normal heart sounds and intact distal pulses.  Exam reveals no gallop.   Pulmonary/Chest: Effort normal and breath sounds normal. No respiratory distress. She has no wheezes. She exhibits no tenderness.  Abdominal: Soft. Bowel sounds are normal. She exhibits no distension, no abdominal bruit and no mass. There is no tenderness.  Genitourinary: No breast swelling, tenderness, discharge or bleeding.  Musculoskeletal: Normal range of motion. She exhibits no edema or tenderness.  Lymphadenopathy:    She has no cervical adenopathy.  Neurological: She is alert. She has normal reflexes. No cranial nerve deficit. She exhibits normal muscle tone. Coordination normal.  Skin: Skin is warm and dry. No rash noted. No erythema. No pallor.  Solar lentigines diffusely  Psychiatric: She has a normal mood and affect.          Assessment & Plan:   Problem List Items Addressed This  Visit      Other   Routine general medical examination at a health care facility - Primary    Reviewed health habits including diet and exercise and skin cancer prevention Reviewed appropriate screening tests for age  Also reviewed health mt list, fam hx and immunization status , as well as social and family history   See HPI Labs reviewed  Will f/u with derm for skin screening Will see if ins covers zoster vaccine and update [place]  Gets a flu shot every fall  Mammogram planned next week  Will do pap  at next annual exam      Insomnia    Continues trazadone - it is helpful  Also good sleep hygiene        Other Visit Diagnoses   None.

## 2015-08-28 NOTE — Patient Instructions (Addendum)
Get your mammogram as scheduled  If you are interested in a shingles/zoster vaccine - call your insurance to check on coverage,( you should not get it within 1 month of other vaccines) , then call us for a prescription  for it to take to a pharmacy that gives the shot , or make a nurse visit to get it here depending on your coverage Get your flu shot every fall

## 2015-08-28 NOTE — Progress Notes (Signed)
Pre visit review using our clinic review tool, if applicable. No additional management support is needed unless otherwise documented below in the visit note. 

## 2015-08-28 NOTE — Assessment & Plan Note (Signed)
Reviewed health habits including diet and exercise and skin cancer prevention Reviewed appropriate screening tests for age  Also reviewed health mt list, fam hx and immunization status , as well as social and family history   See HPI Labs reviewed  Will f/u with derm for skin screening Will see if ins covers zoster vaccine and update us  Gets a flu shot every fall  Mammogram planned next week  Will do pap at next annual exam

## 2015-08-29 NOTE — Assessment & Plan Note (Signed)
Continues trazadone - it is helpful  Also good sleep hygiene

## 2015-09-03 ENCOUNTER — Ambulatory Visit
Admission: RE | Admit: 2015-09-03 | Discharge: 2015-09-03 | Disposition: A | Discharge status: Home / Self Care | Payer: Commercial Managed Care - HMO | Source: Ambulatory Visit | Attending: Family Medicine | Admitting: Family Medicine

## 2015-09-03 ENCOUNTER — Other Ambulatory Visit: Payer: Self-pay | Admitting: Family Medicine

## 2015-09-03 DIAGNOSIS — Z1231 Encounter for screening mammogram for malignant neoplasm of breast: Secondary | ICD-10-CM | POA: Insufficient documentation

## 2016-04-13 DIAGNOSIS — M545 Low back pain: Secondary | ICD-10-CM | POA: Diagnosis not present

## 2016-04-13 DIAGNOSIS — M25552 Pain in left hip: Secondary | ICD-10-CM | POA: Diagnosis not present

## 2016-04-18 DIAGNOSIS — M545 Low back pain: Secondary | ICD-10-CM | POA: Diagnosis not present

## 2016-04-22 ENCOUNTER — Other Ambulatory Visit: Payer: Self-pay | Admitting: Orthopedic Surgery

## 2016-05-13 DIAGNOSIS — M4310 Spondylolisthesis, site unspecified: Secondary | ICD-10-CM | POA: Diagnosis not present

## 2016-05-19 ENCOUNTER — Ambulatory Visit (INDEPENDENT_AMBULATORY_CARE_PROVIDER_SITE_OTHER): Payer: Commercial Managed Care - HMO

## 2016-05-19 ENCOUNTER — Encounter: Payer: Self-pay | Admitting: Podiatry

## 2016-05-19 ENCOUNTER — Ambulatory Visit (INDEPENDENT_AMBULATORY_CARE_PROVIDER_SITE_OTHER): Payer: Commercial Managed Care - HMO | Admitting: Podiatry

## 2016-05-19 ENCOUNTER — Ambulatory Visit: Payer: Commercial Managed Care - HMO

## 2016-05-19 VITALS — BP 139/74 | HR 56 | Resp 16 | Ht 62.0 in | Wt 150.0 lb

## 2016-05-19 DIAGNOSIS — M79672 Pain in left foot: Principal | ICD-10-CM

## 2016-05-19 DIAGNOSIS — L858 Other specified epidermal thickening: Secondary | ICD-10-CM | POA: Diagnosis not present

## 2016-05-19 DIAGNOSIS — B070 Plantar wart: Secondary | ICD-10-CM

## 2016-05-19 DIAGNOSIS — L280 Lichen simplex chronicus: Secondary | ICD-10-CM | POA: Diagnosis not present

## 2016-05-19 DIAGNOSIS — G5762 Lesion of plantar nerve, left lower limb: Secondary | ICD-10-CM

## 2016-05-19 DIAGNOSIS — M79671 Pain in right foot: Secondary | ICD-10-CM

## 2016-05-19 NOTE — Progress Notes (Signed)
   Subjective:    Patient ID: MCKINNA DEMARS, female    DOB: 01/02/1956, 61 y.o.   MRN: [id number]  HPI;Colon she presents today chief complaint of pain to her left foot. She states that is below the toes plantarly. She states that it feels like something is wide and up underneath the toes and there is some numbness. This is in the left foot. She states that the right foot has a mild bunion and this got on the bottom just by the base of the second toe that is painful. She states that this is going on for about a year.    Review of Systems  Musculoskeletal: Positive for arthralgias, back pain and myalgias.  All other systems reviewed and are negative.      Objective:   Physical Exam: Vital signs are stable alert and oriented 3. Pulses are palpable. Neurologic sensorium is intact. Deep tendon reflexes are intact muscle strength +5 over 5 dorsiflexion plantar flexors and inverters everters all edges of musculature is intact. Orthopedic evaluation demonstrates all joints distal to the ankle for range of motion without crepitation. Mild hallux valgus deformity of the right foot. She also has pain on palpation third interdigital space of the left foot replicating the pain that she was experiencing the broader end. Radiographs of the bilateral foot taken today demonstrate no major osseous abnormalities other than a slight increase in the first metatarsal angle and increase in the hallux abductus angle. Small verrucoid lesion base of the second toe plantarly.        Assessment & Plan:  Mild hallux valgus right.  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  Neuroma third interdigital space left foot.  Plan: Discussed etiology etiology pathology inserted versus surgical therapies. At this point injected Kenalog and local anesthetic to the third interdigital space of the left foot. This is performed today and she tolerated this procedure well we did discuss the possible need for dehydrated  alcohol.  We discussed the possible surgical correction of her bunion deformity which she would like to postpone this point.  We performed a surgical curettage today of a lesion that measures 0.5 L in total diameter beneath the second toe at the base on the forefoot. This is performed as a surgical curettage after local anesthesia consisting of 1-1/2 mL of Marcaine plain lidocaine with epinephrine was injected sublesionally. He tolerated this procedure well and was provided with both already going instructions for care and soaking of the foot. The lesion was sent for pathologic evaluation. We'll follow-up with her in 1-2 weeks.

## 2016-05-19 NOTE — Patient Instructions (Signed)

## 2016-06-02 ENCOUNTER — Ambulatory Visit: Payer: Commercial Managed Care - HMO | Admitting: Podiatry

## 2016-06-04 ENCOUNTER — Encounter: Payer: Self-pay | Admitting: Podiatry

## 2016-06-04 ENCOUNTER — Ambulatory Visit (INDEPENDENT_AMBULATORY_CARE_PROVIDER_SITE_OTHER): Payer: Commercial Managed Care - HMO | Admitting: Podiatry

## 2016-06-04 DIAGNOSIS — B070 Plantar wart: Secondary | ICD-10-CM | POA: Diagnosis not present

## 2016-06-04 DIAGNOSIS — G5762 Lesion of plantar nerve, left lower limb: Secondary | ICD-10-CM

## 2016-06-06 NOTE — Progress Notes (Signed)
She presents today for follow-up third interdigital space right foot she states the foot is not painful it feels like something is in the foot just sort of sore. She also follows up today for the verruca plantaris there were removed beneath the second toe she states that he feels sort of hard like there is a knot or something there. She's been putting Polysporin on it.  Objective: Vital signs are stable alert and oriented 3. Pulses are palpable. Pathology results demonstrated lichen simplex chronicus. The wound does demonstrate a rather hard scar or scab over top of the lesion which appeared to be a granuloma that had hardened up. At this point I debrided it it did have some bleeding but negligible.  Assessment: At this point I do not believe we injected the left foot since he was doing better. I will follow-up with her in 3 weeks to reevaluate the right foot.

## 2016-06-23 DIAGNOSIS — R030 Elevated blood-pressure reading, without diagnosis of hypertension: Secondary | ICD-10-CM | POA: Diagnosis not present

## 2016-06-23 DIAGNOSIS — M4310 Spondylolisthesis, site unspecified: Secondary | ICD-10-CM | POA: Diagnosis not present

## 2016-06-30 ENCOUNTER — Ambulatory Visit (INDEPENDENT_AMBULATORY_CARE_PROVIDER_SITE_OTHER): Payer: Commercial Managed Care - HMO | Admitting: Podiatry

## 2016-06-30 ENCOUNTER — Encounter: Payer: Self-pay | Admitting: Podiatry

## 2016-06-30 DIAGNOSIS — T148XXA Other injury of unspecified body region, initial encounter: Secondary | ICD-10-CM

## 2016-06-30 NOTE — Progress Notes (Signed)
She presents today for follow-up of her forefoot left. The biopsy area which was taken and now demonstrates a superficial wound. She states that she has not been staying off of the foot she continues to exercise she exercises with the wound open and not covered. She walks with flip flops and sandals and this being just beneath the base of her second toe is not healing well.  Objective: Vital signs are stable she is alert and oriented 3 the wound appears to be healing from last visit. There is no signs of infection there is some granulation tissue but more than likely secondary to irritation.  Assessment: Chronic wound subsecond right.  Plan: At this point encouraged her to keep this dry. She will stop applying any Vaseline to the area she will stop soaking the area. I will follow-up with her in a couple of weeks.  She's concerned about the extensive bill that she is creating at this point and since we charged her for a follow-up visit during her global I did not charge and office visit today.

## 2016-07-23 ENCOUNTER — Encounter: Payer: Self-pay | Admitting: Podiatry

## 2016-07-23 ENCOUNTER — Ambulatory Visit (INDEPENDENT_AMBULATORY_CARE_PROVIDER_SITE_OTHER): Payer: 59 | Admitting: Podiatry

## 2016-07-23 DIAGNOSIS — T148XXA Other injury of unspecified body region, initial encounter: Secondary | ICD-10-CM

## 2016-07-23 NOTE — Progress Notes (Signed)
She presents today for follow-up of the wound to the plantar aspect of her second toe right foot. She states is better but still little sore and seems to be healing in. She states that she covers the wound occasionally but she has not been applying any medication as directed. She states that she covers sometimes with a Band-Aid but most of the time not.  Objective: Vital signs are stable alert and oriented 3. No erythema edema cellulitis drainage or odor. Appears to be healing quite nicely at this point. She still has some opening present that has decreased in size by approximately 50% since I saw him last.  Assessment: Well healing delay wound right foot. Instructed her once again today to keep off the foot to cover with Band-Aid until it is completely healed.

## 2016-08-04 ENCOUNTER — Other Ambulatory Visit: Payer: Self-pay | Admitting: *Deleted

## 2016-08-04 MED ORDER — TRAZODONE HCL 50 MG PO TABS: 50.0000 mg | ORAL_TABLET | Freq: Every day | ORAL | 0 refills | Status: DC

## 2016-08-04 NOTE — Telephone Encounter (Signed)
Last filled on 08/28/15 #90 tabs with 3 additional refills, pt has a CPE scheduled on 09/02/16, please advise

## 2016-08-04 NOTE — Telephone Encounter (Signed)
done

## 2016-08-04 NOTE — Telephone Encounter (Signed)
Please refill times one  

## 2016-08-23 ENCOUNTER — Telehealth: Payer: Self-pay | Admitting: Family Medicine

## 2016-08-23 DIAGNOSIS — Z0000 Encounter for general adult medical examination without abnormal findings: Secondary | ICD-10-CM

## 2016-08-23 NOTE — Telephone Encounter (Signed)
-----   Message from Alvina Chouerri J Walsh sent at 08/20/2016  4:00 PM EDT ----- Regarding: Lab orders for Friday, 8.10.18 Patient is scheduled for CPX labs, please order future labs, Thanks , Camelia Engerri

## 2016-08-28 ENCOUNTER — Other Ambulatory Visit (INDEPENDENT_AMBULATORY_CARE_PROVIDER_SITE_OTHER): Payer: 59

## 2016-08-28 DIAGNOSIS — Z0000 Encounter for general adult medical examination without abnormal findings: Secondary | ICD-10-CM

## 2016-08-28 LAB — CBC WITH DIFFERENTIAL/PLATELET
Basophils Absolute: 0 10*3/uL (ref 0.0–0.1)
Basophils Relative: 0.5 % (ref 0.0–3.0)
EOS ABS: 0.2 10*3/uL (ref 0.0–0.7)
Eosinophils Relative: 2.3 % (ref 0.0–5.0)
HCT: 38.1 % (ref 36.0–46.0)
HEMOGLOBIN: 12.5 g/dL (ref 12.0–15.0)
LYMPHS PCT: 47.7 % — AB (ref 12.0–46.0)
Lymphs Abs: 3.1 10*3/uL (ref 0.7–4.0)
MCHC: 32.8 g/dL (ref 30.0–36.0)
MCV: 93.8 fl (ref 78.0–100.0)
Monocytes Absolute: 0.7 10*3/uL (ref 0.1–1.0)
Monocytes Relative: 10.9 % (ref 3.0–12.0)
NEUTROS ABS: 2.5 10*3/uL (ref 1.4–7.7)
Neutrophils Relative %: 38.6 % — ABNORMAL LOW (ref 43.0–77.0)
PLATELETS: 288 10*3/uL (ref 150.0–400.0)
RBC: 4.06 Mil/uL (ref 3.87–5.11)
RDW: 13.6 % (ref 11.5–15.5)
WBC: 6.4 10*3/uL (ref 4.0–10.5)

## 2016-08-28 LAB — LIPID PANEL
Cholesterol: 217 mg/dL — ABNORMAL HIGH (ref 0–200)
HDL: 61.8 mg/dL (ref >39.00)
LDL CALC: 140 mg/dL — AB (ref 0–99)
NonHDL: 155.01
TRIGLYCERIDES: 73 mg/dL (ref 0.0–149.0)
Total CHOL/HDL Ratio: 4
VLDL: 14.6 mg/dL (ref 0.0–40.0)

## 2016-08-28 LAB — COMPREHENSIVE METABOLIC PANEL
ALBUMIN: 4 g/dL (ref 3.5–5.2)
ALT: 12 U/L (ref 0–35)
AST: 19 U/L (ref 0–37)
Alkaline Phosphatase: 62 U/L (ref 39–117)
BUN: 13 mg/dL (ref 6–23)
CALCIUM: 9.3 mg/dL (ref 8.4–10.5)
CO2: 29 meq/L (ref 19–32)
Chloride: 104 mEq/L (ref 96–112)
Creatinine, Ser: 0.78 mg/dL (ref 0.40–1.20)
GFR: 79.57 mL/min (ref >60.00)
Glucose, Bld: 94 mg/dL (ref 70–99)
POTASSIUM: 4 meq/L (ref 3.5–5.1)
Sodium: 140 mEq/L (ref 135–145)
Total Bilirubin: 0.3 mg/dL (ref 0.2–1.2)
Total Protein: 7.1 g/dL (ref 6.0–8.3)

## 2016-08-28 LAB — TSH: TSH: 2.52 u[IU]/mL (ref 0.35–4.50)

## 2016-09-02 ENCOUNTER — Other Ambulatory Visit: Payer: Self-pay | Admitting: Family Medicine

## 2016-09-02 ENCOUNTER — Other Ambulatory Visit (HOSPITAL_COMMUNITY)
Admission: RE | Admit: 2016-09-02 | Discharge: 2016-09-02 | Disposition: A | Discharge status: Home / Self Care | Payer: 59 | Source: Ambulatory Visit | Attending: Family Medicine | Admitting: Family Medicine

## 2016-09-02 ENCOUNTER — Encounter: Payer: Self-pay | Admitting: Family Medicine

## 2016-09-02 ENCOUNTER — Ambulatory Visit (INDEPENDENT_AMBULATORY_CARE_PROVIDER_SITE_OTHER): Payer: 59 | Admitting: Family Medicine

## 2016-09-02 VITALS — BP 120/78 | HR 62 | Temp 98.1°F | Wt 149.8 lb

## 2016-09-02 DIAGNOSIS — Z0000 Encounter for general adult medical examination without abnormal findings: Secondary | ICD-10-CM

## 2016-09-02 DIAGNOSIS — Z01419 Encounter for gynecological examination (general) (routine) without abnormal findings: Secondary | ICD-10-CM | POA: Diagnosis not present

## 2016-09-02 DIAGNOSIS — E785 Hyperlipidemia, unspecified: Secondary | ICD-10-CM

## 2016-09-02 DIAGNOSIS — E7800 Pure hypercholesterolemia, unspecified: Secondary | ICD-10-CM | POA: Diagnosis not present

## 2016-09-02 DIAGNOSIS — M4800 Spinal stenosis, site unspecified: Secondary | ICD-10-CM

## 2016-09-02 HISTORY — DX: Hyperlipidemia, unspecified: E78.5

## 2016-09-02 MED ORDER — TRAZODONE HCL 50 MG PO TABS: 50.0000 mg | ORAL_TABLET | Freq: Every day | ORAL | 3 refills | Status: DC

## 2016-09-02 NOTE — Patient Instructions (Addendum)
Go ahead and schedule your mammogram   Try to get 1200-1500 mg of calcium per day with at least 1000 iu of vitamin D - for bone health   For cholesterol Avoid red meat/ fried foods/ egg yolks/ fatty breakfast meats/ butter, cheese and high fat dairy/ and shellfish

## 2016-09-02 NOTE — Progress Notes (Signed)
Subjective:    Patient ID: Jamie Garza, female    DOB: [birth year], 61 y.o.   MRN: [id number]  HPI  Here for health maintenance exam and to review chronic medical problems    Doing well overall   Has exp some numbness in her leg and was diagnosed with severe spinal stenosis  neurosurg is watching her (Dr Dutch Quint)  She gets by pretty well  Bothers her occasionally   Also sees Dr Al Corpus - [facility] -injection -not better Also had a wound that did not heal well      Wt Readings from Last 3 Encounters:  09/02/16 149 lb 12 oz (67.9 kg)  05/19/16 150 lb (68 kg)  08/28/15 150 lb 8 oz (68.3 kg)  stable  Eats healthy much of the time  She may start weight watchers program  Does exercise  27.39 kg/m  Pap 8/15 nl No hx of abn paps / no hx of gyn problems  Has not had a hysterectomy  Seeing someone new for a year  Needs a pap today   Mammogram 8/17 negative -will schedule  Self breast exam -no lumps   Gets flu shot in the fall   Tetanus shot 11/10  Colonoscopy 8/11 nl with 10 y recall   dexa 6/13 nl (at ortho office) Hx of a radius fx  Not always taking ca and D  No falls or new fx   Had zostavax 10/17  Labs: Results for orders placed or performed in visit on 08/28/16  CBC with Differential/Platelet  Result Value Ref Range   WBC 6.4 4.0 - 10.5 K/uL   RBC 4.06 3.87 - 5.11 Mil/uL   Hemoglobin 12.5 12.0 - 15.0 g/dL   HCT 78.2 95.6 - 21.3 %   MCV 93.8 78.0 - 100.0 fl   MCHC 32.8 30.0 - 36.0 g/dL   RDW 08.6 57.8 - 46.9 %   Platelets 288.0 150.0 - 400.0 K/uL   Neutrophils Relative % 38.6 (L) 43.0 - 77.0 %   Lymphocytes Relative 47.7 (H) 12.0 - 46.0 %   Monocytes Relative 10.9 3.0 - 12.0 %   Eosinophils Relative 2.3 0.0 - 5.0 %   Basophils Relative 0.5 0.0 - 3.0 %   Neutro Abs 2.5 1.4 - 7.7 K/uL   Lymphs Abs 3.1 0.7 - 4.0 K/uL   Monocytes Absolute 0.7 0.1 - 1.0 K/uL   Eosinophils Absolute 0.2 0.0 - 0.7 K/uL   Basophils Absolute 0.0 0.0 - 0.1 K/uL    Comprehensive metabolic panel  Result Value Ref Range   Sodium 140 135 - 145 mEq/L   Potassium 4.0 3.5 - 5.1 mEq/L   Chloride 104 96 - 112 mEq/L   CO2 29 19 - 32 mEq/L   Glucose, Bld 94 70 - 99 mg/dL   BUN 13 6 - 23 mg/dL   Creatinine, Ser 6.29 0.40 - 1.20 mg/dL   Total Bilirubin 0.3 0.2 - 1.2 mg/dL   Alkaline Phosphatase 62 39 - 117 U/L   AST 19 0 - 37 U/L   ALT 12 0 - 35 U/L   Total Protein 7.1 6.0 - 8.3 g/dL   Albumin 4.0 3.5 - 5.2 g/dL   Calcium 9.3 8.4 - 52.8 mg/dL   GFR 41.32 >44.01 mL/min  Lipid panel  Result Value Ref Range   Cholesterol 217 (H) 0 - 200 mg/dL   Triglycerides 02.7 0.0 - 149.0 mg/dL   HDL 25.36 >64.40 mg/dL   VLDL 34.7 0.0 - 42.5 mg/dL  LDL Cholesterol 140 (H) 0 - 99 mg/dL   Total CHOL/HDL Ratio 4    NonHDL 155.01   TSH  Result Value Ref Range   TSH 2.52 0.35 - 4.50 uIU/mL    Cholesterol is going up (LDL)  Runs in the family    Review of Systems    Review of Systems  Constitutional: Negative for fever, appetite change,  and unexpected weight change. pos for more fatigue lately  Eyes: Negative for pain and visual disturbance.  Respiratory: Negative for cough and shortness of breath.   Cardiovascular: Negative for cp or palpitations    Gastrointestinal: Negative for nausea, diarrhea and constipation.  Genitourinary: Negative for urgency and frequency.  Skin: Negative for pallor or rash   Neurological: Negative for weakness, light-headedness, and headaches.  Hematological: Negative for adenopathy. Does not bruise/bleed easily.  Psychiatric/Behavioral: Negative for dysphoric mood. The patient is not nervous/anxious.      Objective:   Physical Exam  Constitutional: She appears well-developed and well-nourished. No distress.  Well appearing   HENT:  Head: Normocephalic and atraumatic.  Right Ear: External ear normal.  Left Ear: External ear normal.  Mouth/Throat: Oropharynx is clear and moist.  Eyes: Pupils are equal, round, and reactive  to light. Conjunctivae and EOM are normal. No scleral icterus.  Neck: Normal range of motion. Neck supple. No JVD present. Carotid bruit is not present. No thyromegaly present.  Cardiovascular: Normal rate, regular rhythm, normal heart sounds and intact distal pulses.  Exam reveals no gallop.   Pulmonary/Chest: Effort normal and breath sounds normal. No respiratory distress. She has no wheezes. She exhibits no tenderness.  Abdominal: Soft. Bowel sounds are normal. She exhibits no distension, no abdominal bruit and no mass. There is no tenderness.  Genitourinary: No breast swelling, tenderness, discharge or bleeding.  Genitourinary Comments: Breast exam: No mass, nodules, thickening, tenderness, bulging, retraction, inflamation, nipple discharge or skin changes noted.  No axillary or clavicular LA.             Anus appears normal w/o hemorrhoids or masses     External genitalia : nl appearance and hair distribution/no lesions     Urethral meatus : nl size, no lesions or prolapse     Urethra: no masses, tenderness or scarring    Bladder : no masses or tenderness     Vagina: nl general appearance, no discharge or  Lesions, no significant cystocele  or rectocele     Cervix: no lesions/ discharge or friability    Uterus: nl size, contour, position, and mobility (not fixed) , non tender    Adnexa : no masses, tenderness, enlargement or nodularity        Musculoskeletal: Normal range of motion. She exhibits no edema or tenderness.  Lymphadenopathy:    She has no cervical adenopathy.  Neurological: She is alert. She has normal reflexes. No cranial nerve deficit. She exhibits normal muscle tone. Coordination normal.  Skin: Skin is warm and dry. No rash noted. No erythema. No pallor.  Solar lentigines diffusely    Psychiatric: She has a normal mood and affect.          Assessment & Plan:   Problem List Items Addressed This Visit      Other   Encounter for routine  gynecological examination    Exam and pap done  Declines std testing  No c/o      Relevant Orders   Cytology - PAP   Hyperlipidemia    Disc goals for  lipids and reasons to control them Rev labs with pt Rev low sat fat diet in detail LDL is up  Handout given on low trans/sat fat diet  Re check 1 y  Consider statin if not improving        Routine general medical examination at a health care facility - Primary    Reviewed health habits including diet and exercise and skin cancer prevention Reviewed appropriate screening tests for age  Also reviewed health mt list, fam hx and immunization status , as well as social and family history   Good habits HPI rev Labs rev Gyn exam and pap today       Spinal stenosis    Pt continues to see neurosurg- Dr Dutch Quint  Disc imp of core strength and low impact exercise

## 2016-09-03 ENCOUNTER — Other Ambulatory Visit: Payer: Self-pay | Admitting: Family Medicine

## 2016-09-03 DIAGNOSIS — Z1231 Encounter for screening mammogram for malignant neoplasm of breast: Secondary | ICD-10-CM

## 2016-09-03 NOTE — Assessment & Plan Note (Signed)
Pt continues to see neurosurg- Dr Dutch QuintPoole  Disc imp of core strength and low impact exercise

## 2016-09-03 NOTE — Assessment & Plan Note (Signed)
Reviewed health habits including diet and exercise and skin cancer prevention Reviewed appropriate screening tests for age  Also reviewed health mt list, fam hx and immunization status , as well as social and family history   Good habits HPI rev Labs rev Gyn exam and pap today

## 2016-09-03 NOTE — Assessment & Plan Note (Signed)
Exam and pap done  Declines std testing  No c/o

## 2016-09-03 NOTE — Assessment & Plan Note (Signed)
Disc goals for lipids and reasons to control them Rev labs with pt Rev low sat fat diet in detail LDL is up  Handout given on low trans/sat fat diet  Re check 1 y  Consider statin if not improving

## 2016-09-08 LAB — CYTOLOGY - PAP
Diagnosis: NEGATIVE
HPV (WINDOPATH): NOT DETECTED

## 2016-09-15 ENCOUNTER — Ambulatory Visit
Admission: RE | Admit: 2016-09-15 | Discharge: 2016-09-15 | Disposition: A | Discharge status: Home / Self Care | Payer: 59 | Source: Ambulatory Visit | Attending: Family Medicine | Admitting: Family Medicine

## 2016-09-15 DIAGNOSIS — Z1231 Encounter for screening mammogram for malignant neoplasm of breast: Secondary | ICD-10-CM | POA: Insufficient documentation

## 2016-09-24 DIAGNOSIS — R030 Elevated blood-pressure reading, without diagnosis of hypertension: Secondary | ICD-10-CM | POA: Diagnosis not present

## 2016-09-24 DIAGNOSIS — M4310 Spondylolisthesis, site unspecified: Secondary | ICD-10-CM | POA: Diagnosis not present

## 2016-10-22 DIAGNOSIS — Z23 Encounter for immunization: Secondary | ICD-10-CM | POA: Diagnosis not present

## 2016-12-30 DIAGNOSIS — R030 Elevated blood-pressure reading, without diagnosis of hypertension: Secondary | ICD-10-CM | POA: Diagnosis not present

## 2016-12-30 DIAGNOSIS — M4310 Spondylolisthesis, site unspecified: Secondary | ICD-10-CM | POA: Diagnosis not present

## 2017-01-14 ENCOUNTER — Encounter: Payer: Self-pay | Admitting: Family Medicine

## 2017-01-14 ENCOUNTER — Ambulatory Visit: Payer: 59 | Admitting: Family Medicine

## 2017-01-14 ENCOUNTER — Other Ambulatory Visit: Payer: Self-pay

## 2017-01-14 VITALS — BP 102/70 | HR 53 | Temp 98.3°F | Ht 62.0 in | Wt 132.5 lb

## 2017-01-14 DIAGNOSIS — H9191 Unspecified hearing loss, right ear: Secondary | ICD-10-CM

## 2017-01-14 DIAGNOSIS — H61893 Other specified disorders of external ear, bilateral: Secondary | ICD-10-CM | POA: Diagnosis not present

## 2017-01-14 NOTE — Progress Notes (Signed)
Dr. Karleen HampshireSpencer T. Jarett Dralle, MD, CAQ Sports [facility] [address] [place], [address] Phone: [phone] Fax: [phone]  01/14/2017  Patient: Jamie Garza, MRN: [id number], DOB: Jan 30, 1955, 61 y.o.  Primary Physician:  Tower, Audrie GallusMarne A, MD   Chief Complaint  Patient presents with  . Ear Pain    Right-Itchy & Stopped Up   Subjective:   Jamie Garza is a 61 y.o. very pleasant female patient who presents with the following:  She has had some dry itchy ears for some time, and wanted some recommendations about this.  She also has some stopped of decreased hearing on her right ear particularly.  Past Medical History, Surgical History, Social History, Family History, Problem List, Medications, and Allergies have been reviewed and updated if relevant.  Patient Active Problem List   Diagnosis Date Noted  . Spinal stenosis 09/02/2016  . Hyperlipidemia 09/02/2016  . Depression with anxiety 10/30/2011  . History of radius fracture 05/23/2011  . Encounter for routine gynecological examination 12/19/2010  . Other screening mammogram 12/19/2010  . Routine general medical examination at a health care facility 12/15/2010  . Insomnia 11/20/2008  . SLEEP APNEA 01/04/2007    Past Medical History:  Diagnosis Date  . Distal radius fracture, left 05/23/2011  . Insomnia   . Sleep apnea   . Spinal stenosis, lumbar    L4 and L5 - severe; L3 and L4 - moderate    Past Surgical History:  Procedure Laterality Date  . WRIST FRACTURE SURGERY      Social History   Socioeconomic History  . Marital status: Married    Spouse name: Not on file  . Number of children: Not on file  . Years of education: Not on file  . Highest education level: Not on file  Social Needs  . Financial resource strain: Not on file  . Food insecurity - worry: Not on file  . Food insecurity - inability: Not on file  . Transportation needs - medical: Not on file  .  Transportation needs - non-medical: Not on file  Occupational History  . Not on file  Tobacco Use  . Smoking status: Never Smoker  . Smokeless tobacco: Never Used  Substance and Sexual Activity  . Alcohol use: Yes    Alcohol/week: 0.0 oz    Comment: occasional  . Drug use: No  . Sexual activity: Not on file  Other Topics Concern  . Not on file  Social History Narrative   ** Merged History Encounter **        Family History  Problem Relation Age of Onset  . Diabetes Mother        borderline DM  . Diabetes Maternal Aunt   . Diabetes Maternal Grandfather   . Diabetes Paternal Grandfather   . Breast cancer Neg Hx     No Known Allergies  Medication list reviewed and updated in full in [place] [facility].  ROS: GEN: Acute illness details above GI: Tolerating PO intake GU: maintaining adequate hydration and urination Pulm: No SOB Interactive and getting along well at home.  Otherwise, ROS is as per the HPI.  Objective:   BP 102/70   Pulse (!) 53   Temp 98.3 F (36.8 C) (Oral)   Ht 5\' 2"  (1.575 m)   Wt 132 lb 8 oz (60.1 kg)   BMI 24.23 kg/m    GEN: WDWN, NAD, Non-toxic, A & O x 3 HEENT: Atraumatic, Normocephalic. Neck supple.  No masses, No LAD. Ears and Nose: No external deformity. Cerumen impaction, clear post-irrigation CV: RRR, No M/G/R. No JVD. No thrill. No extra heart sounds. PULM: CTA B, no wheezes, crackles, rhonchi. No retractions. No resp. distress. No accessory muscle use. EXTR: No c/c/e NEURO Normal gait.  PSYCH: Normally interactive. Conversant. Not depressed or anxious appearing.  Calm demeanor.     Laboratory and Imaging Data:  Assessment and Plan:   Decreased hearing of right ear  Dry ear canal, bilateral  Ceruminosis is noted.  Wax is removed by syringing and manual debridement. Instructions for home care to prevent wax buildup are given.   vaseline or eucerin to ears 2-3 x weekly hydrocortisone reasonable  Follow-up: No Follow-up on  file.  Signed,  Elpidio GaleaSpencer T. Rhonda Vangieson, MD   Allergies as of 01/14/2017   No Known Allergies     Medication List        Accurate as of 01/14/17 11:59 PM. Always use your most recent med list.          CENTRUM ADULTS PO Take 1 capsule by mouth.   LYSINE HCL PO Take 1 tablet by mouth daily.   PROBIOTIC DAILY PO Take 1 capsule by mouth daily.   traZODone 50 MG tablet Commonly known as:  DESYREL Take 1 tablet (50 mg total) by mouth at bedtime.

## 2017-03-22 DIAGNOSIS — M48062 Spinal stenosis, lumbar region with neurogenic claudication: Secondary | ICD-10-CM | POA: Diagnosis not present

## 2017-03-22 DIAGNOSIS — M4310 Spondylolisthesis, site unspecified: Secondary | ICD-10-CM | POA: Diagnosis not present

## 2017-03-22 DIAGNOSIS — R030 Elevated blood-pressure reading, without diagnosis of hypertension: Secondary | ICD-10-CM | POA: Diagnosis not present

## 2017-03-24 DIAGNOSIS — M48062 Spinal stenosis, lumbar region with neurogenic claudication: Secondary | ICD-10-CM | POA: Diagnosis not present

## 2017-03-24 DIAGNOSIS — R030 Elevated blood-pressure reading, without diagnosis of hypertension: Secondary | ICD-10-CM | POA: Diagnosis not present

## 2017-06-24 DIAGNOSIS — M4310 Spondylolisthesis, site unspecified: Secondary | ICD-10-CM | POA: Diagnosis not present

## 2017-08-22 ENCOUNTER — Telehealth: Payer: Self-pay | Admitting: Family Medicine

## 2017-08-22 DIAGNOSIS — Z0000 Encounter for general adult medical examination without abnormal findings: Secondary | ICD-10-CM

## 2017-08-22 DIAGNOSIS — E7800 Pure hypercholesterolemia, unspecified: Secondary | ICD-10-CM

## 2017-08-22 NOTE — Telephone Encounter (Signed)
-----   Message from Wendi MayaLauren Greeson, RT sent at 08/18/2017  4:02 PM EDT ----- Regarding: Lab orders for Friday 08/27/17 Please enter CPE lab orders for 08/27/17. Thanks-Lauren

## 2017-08-27 ENCOUNTER — Other Ambulatory Visit (INDEPENDENT_AMBULATORY_CARE_PROVIDER_SITE_OTHER): Payer: 59

## 2017-08-27 DIAGNOSIS — E7800 Pure hypercholesterolemia, unspecified: Secondary | ICD-10-CM

## 2017-08-27 DIAGNOSIS — Z0000 Encounter for general adult medical examination without abnormal findings: Secondary | ICD-10-CM

## 2017-08-27 LAB — COMPREHENSIVE METABOLIC PANEL
ALK PHOS: 61 U/L (ref 39–117)
ALT: 13 U/L (ref 0–35)
AST: 19 U/L (ref 0–37)
Albumin: 4 g/dL (ref 3.5–5.2)
BUN: 15 mg/dL (ref 6–23)
CO2: 27 meq/L (ref 19–32)
Calcium: 9.5 mg/dL (ref 8.4–10.5)
Chloride: 104 mEq/L (ref 96–112)
Creatinine, Ser: 0.73 mg/dL (ref 0.40–1.20)
GFR: 85.62 mL/min (ref >60.00)
GLUCOSE: 91 mg/dL (ref 70–99)
POTASSIUM: 3.5 meq/L (ref 3.5–5.1)
SODIUM: 139 meq/L (ref 135–145)
TOTAL PROTEIN: 7.3 g/dL (ref 6.0–8.3)
Total Bilirubin: 0.5 mg/dL (ref 0.2–1.2)

## 2017-08-27 LAB — LIPID PANEL
Cholesterol: 183 mg/dL (ref 0–200)
HDL: 60.7 mg/dL (ref >39.00)
LDL Cholesterol: 106 mg/dL — ABNORMAL HIGH (ref 0–99)
NONHDL: 122.36
Total CHOL/HDL Ratio: 3
Triglycerides: 80 mg/dL (ref 0.0–149.0)
VLDL: 16 mg/dL (ref 0.0–40.0)

## 2017-08-27 LAB — CBC WITH DIFFERENTIAL/PLATELET
Basophils Absolute: 0 10*3/uL (ref 0.0–0.1)
Basophils Relative: 0.8 % (ref 0.0–3.0)
EOS PCT: 2.3 % (ref 0.0–5.0)
Eosinophils Absolute: 0.1 10*3/uL (ref 0.0–0.7)
HCT: 36.7 % (ref 36.0–46.0)
Hemoglobin: 12.1 g/dL (ref 12.0–15.0)
LYMPHS ABS: 2.5 10*3/uL (ref 0.7–4.0)
Lymphocytes Relative: 48.8 % — ABNORMAL HIGH (ref 12.0–46.0)
MCHC: 32.9 g/dL (ref 30.0–36.0)
MCV: 88.6 fl (ref 78.0–100.0)
MONO ABS: 0.6 10*3/uL (ref 0.1–1.0)
MONOS PCT: 12.5 % — AB (ref 3.0–12.0)
NEUTROS ABS: 1.8 10*3/uL (ref 1.4–7.7)
NEUTROS PCT: 35.6 % — AB (ref 43.0–77.0)
PLATELETS: 209 10*3/uL (ref 150.0–400.0)
RBC: 4.14 Mil/uL (ref 3.87–5.11)
RDW: 15.5 % (ref 11.5–15.5)
WBC: 5 10*3/uL (ref 4.0–10.5)

## 2017-08-27 LAB — TSH: TSH: 1.7 u[IU]/mL (ref 0.35–4.50)

## 2017-09-03 ENCOUNTER — Encounter: Payer: Self-pay | Admitting: Family Medicine

## 2017-09-03 ENCOUNTER — Ambulatory Visit (INDEPENDENT_AMBULATORY_CARE_PROVIDER_SITE_OTHER): Payer: 59 | Admitting: Family Medicine

## 2017-09-03 VITALS — BP 132/84 | HR 62 | Temp 98.5°F | Ht 61.5 in | Wt 136.8 lb

## 2017-09-03 DIAGNOSIS — Z0000 Encounter for general adult medical examination without abnormal findings: Secondary | ICD-10-CM | POA: Diagnosis not present

## 2017-09-03 DIAGNOSIS — E7800 Pure hypercholesterolemia, unspecified: Secondary | ICD-10-CM | POA: Diagnosis not present

## 2017-09-03 MED ORDER — TRAZODONE HCL 50 MG PO TABS: 50.0000 mg | ORAL_TABLET | Freq: Every day | ORAL | 3 refills | Status: DC

## 2017-09-03 NOTE — Progress Notes (Signed)
Subjective:    Patient ID: Jamie Garza, female    DOB: April 14, 1955, 62 y.o.   MRN: [id number]  HPI  Here for health maintenance exam and to review chronic medical problems    Has been doing well   Has hx of spinal stenosis  Knows she will eventually need surgery  Sees [facility]  - epidural injection (when she had R leg pain and weakness)  Also acupuncture helps   Mother and brother have aortic stenosis  She has no symptoms  She had a M as a baby    Wt Readings from Last 3 Encounters:  09/03/17 136 lb 12.8 oz (62.1 kg)  01/14/17 132 lb 8 oz (60.1 kg)  09/02/16 149 lb 12 oz (67.9 kg)  exercises almost every day  She really worked on it this year  Eating really healthy- on weight watchers program / was off and getting back to it  25.43 kg/m   Flu shot -gets in the fall   Mammogram 8/18 -negative ([facility]  Self breast exam -no lumps or changes   Tetanus shot 11/10  Colonoscopy 8/11- 10 y recall   Pap 8/18 -neg with neg HPV  No gyn symptoms or problems   zostavax 10/17  dexa 6/13 at ortho office (hx of radial fx) Per pt- was a good report /normal  Some ca and D  Exercise regularly  No falls or fx    BP BP Readings from Last 3 Encounters:  09/03/17 132/84  01/14/17 102/70  09/02/16 120/78    Hyperlipidemia Lab Results  Component Value Date   CHOL 183 08/27/2017   CHOL 217 (H) 08/28/2016   CHOL 203 (H) 08/21/2015   Lab Results  Component Value Date   HDL 60.70 08/27/2017   HDL 61.80 08/28/2016   HDL 58.50 08/21/2015   Lab Results  Component Value Date   LDLCALC 106 (H) 08/27/2017   LDLCALC 140 (H) 08/28/2016   LDLCALC 127 (H) 08/21/2015   Lab Results  Component Value Date   TRIG 80.0 08/27/2017   TRIG 73.0 08/28/2016   TRIG 87.0 08/21/2015   Lab Results  Component Value Date   CHOLHDL 3 08/27/2017   CHOLHDL 4 08/28/2016   CHOLHDL 3 08/21/2015   Lab Results  Component Value Date   LDLDIRECT 137.8 12/03/2009  LDL is much improved to 106  On weight watchers Uses egg beaters instead of eggs   Other labs Results for orders placed or performed in visit on 08/27/17  TSH  Result Value Ref Range   TSH 1.70 0.35 - 4.50 uIU/mL  Lipid panel  Result Value Ref Range   Cholesterol 183 0 - 200 mg/dL   Triglycerides 40.980.0 0.0 - 149.0 mg/dL   HDL 81.1960.70 >14.78>39.00 mg/dL   VLDL 29.516.0 0.0 - 62.140.0 mg/dL   LDL Cholesterol 308106 (H) 0 - 99 mg/dL   Total CHOL/HDL Ratio 3    NonHDL 122.36   Comprehensive metabolic panel  Result Value Ref Range   Sodium 139 135 - 145 mEq/L   Potassium 3.5 3.5 - 5.1 mEq/L   Chloride 104 96 - 112 mEq/L   CO2 27 19 - 32 mEq/L   Glucose, Bld 91 70 - 99 mg/dL   BUN 15 6 - 23 mg/dL   Creatinine, Ser 6.570.73 0.40 - 1.20 mg/dL   Total Bilirubin 0.5 0.2 - 1.2 mg/dL   Alkaline Phosphatase 61 39 - 117 U/L   AST 19  0 - 37 U/L   ALT 13 0 - 35 U/L   Total Protein 7.3 6.0 - 8.3 g/dL   Albumin 4.0 3.5 - 5.2 g/dL   Calcium 9.5 8.4 - 16.1 mg/dL   GFR 09.60 >45.40 mL/min  CBC with Differential/Platelet  Result Value Ref Range   WBC 5.0 4.0 - 10.5 K/uL   RBC 4.14 3.87 - 5.11 Mil/uL   Hemoglobin 12.1 12.0 - 15.0 g/dL   HCT 98.1 19.1 - 47.8 %   MCV 88.6 78.0 - 100.0 fl   MCHC 32.9 30.0 - 36.0 g/dL   RDW 29.5 62.1 - 30.8 %   Platelets 209.0 150.0 - 400.0 K/uL   Neutrophils Relative % 35.6 (L) 43.0 - 77.0 %   Lymphocytes Relative 48.8 (H) 12.0 - 46.0 %   Monocytes Relative 12.5 (H) 3.0 - 12.0 %   Eosinophils Relative 2.3 0.0 - 5.0 %   Basophils Relative 0.8 0.0 - 3.0 %   Neutro Abs 1.8 1.4 - 7.7 K/uL   Lymphs Abs 2.5 0.7 - 4.0 K/uL   Monocytes Absolute 0.6 0.1 - 1.0 K/uL   Eosinophils Absolute 0.1 0.0 - 0.7 K/uL   Basophils Absolute 0.0 0.0 - 0.1 K/uL     Good labs overall    Patient Active Problem List   Diagnosis Date Noted  . Spinal stenosis 09/02/2016  . Hyperlipidemia 09/02/2016  . Depression with anxiety 10/30/2011  . History of radius fracture  05/23/2011  . Encounter for routine gynecological examination 12/19/2010  . Other screening mammogram 12/19/2010  . Routine general medical examination at a health care facility 12/15/2010  . Insomnia 11/20/2008  . SLEEP APNEA 01/04/2007   Past Medical History:  Diagnosis Date  . Distal radius fracture, left 05/23/2011  . Insomnia   . Sleep apnea   . Spinal stenosis, lumbar    L4 and L5 - severe; L3 and L4 - moderate   Past Surgical History:  Procedure Laterality Date  . WRIST FRACTURE SURGERY     Social History   Tobacco Use  . Smoking status: Never Smoker  . Smokeless tobacco: Never Used  Substance Use Topics  . Alcohol use: Yes    Alcohol/week: 0.0 standard drinks    Comment: occasional  . Drug use: No   Family History  Problem Relation Age of Onset  . Diabetes Mother        borderline DM  . Diabetes Maternal Aunt   . Diabetes Maternal Grandfather   . Diabetes Paternal Grandfather   . Breast cancer Neg Hx    No Known Allergies Current Outpatient Medications on File Prior to Visit  Medication Sig Dispense Refill  . LYSINE HCL PO Take 1 tablet by mouth daily.    . Multiple Vitamins-Minerals (CENTRUM ADULTS PO) Take 1 capsule by mouth.    . Probiotic Product (PROBIOTIC DAILY PO) Take 1 capsule by mouth daily.     No current facility-administered medications on file prior to visit.       Review of Systems  Constitutional: Negative for activity change, appetite change, fatigue, fever and unexpected weight change.  HENT: Negative for congestion, ear pain, rhinorrhea, sinus pressure and sore throat.   Eyes: Negative for pain, redness and visual disturbance.  Respiratory: Negative for cough, shortness of breath and wheezing.   Cardiovascular: Negative for chest pain and palpitations.  Gastrointestinal: Negative for abdominal pain, blood in stool, constipation and diarrhea.  Endocrine: Negative for polydipsia and polyuria.  Genitourinary: Negative for dysuria,  frequency and urgency.  Musculoskeletal: Positive for back pain. Negative for arthralgias and myalgias.  Skin: Negative for pallor and rash.  Allergic/Immunologic: Negative for environmental allergies.  Neurological: Negative for dizziness, syncope and headaches.  Hematological: Negative for adenopathy. Does not bruise/bleed easily.  Psychiatric/Behavioral: Negative for decreased concentration and dysphoric mood. The patient is not nervous/anxious.        Objective:   Physical Exam  Constitutional: She appears well-developed and well-nourished. No distress.  Well appearing   HENT:  Head: Normocephalic and atraumatic.  Right Ear: External ear normal.  Left Ear: External ear normal.  Mouth/Throat: Oropharynx is clear and moist.  Eyes: Pupils are equal, round, and reactive to light. Conjunctivae and EOM are normal. No scleral icterus.  Neck: Normal range of motion. Neck supple. No JVD present. Carotid bruit is not present. No thyromegaly present.  Cardiovascular: Normal rate, regular rhythm, normal heart sounds and intact distal pulses. Exam reveals no gallop.  Pulmonary/Chest: Effort normal and breath sounds normal. No respiratory distress. She has no wheezes. She exhibits no tenderness. No breast tenderness, discharge or bleeding.  Abdominal: Soft. Bowel sounds are normal. She exhibits no distension, no abdominal bruit and no mass. There is no tenderness.  Genitourinary: No breast tenderness, discharge or bleeding.  Musculoskeletal: Normal range of motion. She exhibits no edema or tenderness.  Lymphadenopathy:    She has no cervical adenopathy.  Neurological: She is alert. She has normal reflexes. No cranial nerve deficit. She exhibits normal muscle tone. Coordination normal.  Skin: Skin is warm and dry. No rash noted. No erythema. No pallor.  Tanned Some lentigines   Psychiatric: She has a normal mood and affect.          Assessment & Plan:   Problem List Items Addressed This  Visit      Other   Hyperlipidemia    Disc goals for lipids and reasons to control them Rev last labs with pt Rev low sat fat diet in detail Improved LDL today with better diet        Routine general medical examination at a health care facility - Primary    Reviewed health habits including diet and exercise and skin cancer prevention Reviewed appropriate screening tests for age  Also reviewed health mt list, fam hx and immunization status , as well as social and family history   See HPI Labs reviewed  Cholesterol improved Pt will schedule her own mammogram  Enc her to get a flu shot in the fall  Also to get on wait list for shingrix at pharmacy  rec Ca and D for bone health

## 2017-09-03 NOTE — Patient Instructions (Addendum)
Don't forget to get a flu shot in the fall   Also schedule your mammogram    If you are interested in the new shingles vaccine (Shingrix) - call your local pharmacy to check on coverage and availability  If it is affordable -get on wait list   Try to get 1200-1500 mg of calcium per day with at least 1000 iu of vitamin D - for bone health  If you get constipation with calcium - then just take vitamin D   Cholesterol is better  Keep working on it

## 2017-09-04 NOTE — Assessment & Plan Note (Signed)
Disc goals for lipids and reasons to control them Rev last labs with pt Rev low sat fat diet in detail Improved LDL today with better diet

## 2017-09-04 NOTE — Assessment & Plan Note (Signed)
Reviewed health habits including diet and exercise and skin cancer prevention Reviewed appropriate screening tests for age  Also reviewed health mt list, fam hx and immunization status , as well as social and family history   See HPI Labs reviewed  Cholesterol improved Pt will schedule her own mammogram  Enc her to get a flu shot in the fall  Also to get on wait list for shingrix at pharmacy  rec Ca and D for bone health

## 2017-09-07 ENCOUNTER — Other Ambulatory Visit: Payer: Self-pay | Admitting: Family Medicine

## 2017-09-07 DIAGNOSIS — Z1231 Encounter for screening mammogram for malignant neoplasm of breast: Secondary | ICD-10-CM

## 2017-09-21 ENCOUNTER — Ambulatory Visit
Admission: RE | Admit: 2017-09-21 | Discharge: 2017-09-21 | Disposition: A | Discharge status: Home / Self Care | Payer: 59 | Source: Ambulatory Visit | Attending: Family Medicine | Admitting: Family Medicine

## 2017-09-21 DIAGNOSIS — Z1231 Encounter for screening mammogram for malignant neoplasm of breast: Secondary | ICD-10-CM

## 2017-09-29 DIAGNOSIS — M4310 Spondylolisthesis, site unspecified: Secondary | ICD-10-CM | POA: Diagnosis not present

## 2017-09-29 DIAGNOSIS — G5601 Carpal tunnel syndrome, right upper limb: Secondary | ICD-10-CM | POA: Diagnosis not present

## 2017-10-16 DIAGNOSIS — Z23 Encounter for immunization: Secondary | ICD-10-CM | POA: Diagnosis not present

## 2017-10-18 DIAGNOSIS — G5601 Carpal tunnel syndrome, right upper limb: Secondary | ICD-10-CM | POA: Diagnosis not present

## 2017-10-25 DIAGNOSIS — G5601 Carpal tunnel syndrome, right upper limb: Secondary | ICD-10-CM | POA: Diagnosis not present

## 2018-06-14 ENCOUNTER — Ambulatory Visit (INDEPENDENT_AMBULATORY_CARE_PROVIDER_SITE_OTHER): Payer: 59 | Admitting: Family Medicine

## 2018-06-14 ENCOUNTER — Encounter: Payer: Self-pay | Admitting: Family Medicine

## 2018-06-14 DIAGNOSIS — I10 Essential (primary) hypertension: Secondary | ICD-10-CM

## 2018-06-14 HISTORY — DX: Essential (primary) hypertension: I10

## 2018-06-14 MED ORDER — LISINOPRIL 10 MG PO TABS: 10.0000 mg | ORAL_TABLET | Freq: Every day | ORAL | 11 refills | Status: DC

## 2018-06-14 NOTE — Assessment & Plan Note (Signed)
More than 3 elevated bp readings in a row at home/ other offices Good habits fam hx -mother   Disc DASH eating  Start lisinopril 10 mg daily (disc poss side eff like hypotension or cough)  Continue water/exercise F/u 4-5 weeks  She will check bp at home when relaxed with wrist cuff  Handout given in mychart -HTN  Will plan labs once stable on medication /titrated

## 2018-06-14 NOTE — Patient Instructions (Addendum)
Start lisinopril 10 mg once daily in the am  Continue good exercise and diet habits Also keep drinking water  Avoid processed foods with a lot of sodium   Your elevated blood pressure no doubt is genetic   If any problems or issues please call  Our office will contact you to schedule a visit in 4-5 weeks    Hypertension Hypertension, commonly called high blood pressure, is when the force of blood pumping through the arteries is too strong. The arteries are the blood vessels that carry blood from the heart throughout the body. Hypertension forces the heart to work harder to pump blood and may cause arteries to become narrow or stiff. Having untreated or uncontrolled hypertension can cause heart attacks, strokes, kidney disease, and other problems. A blood pressure reading consists of a higher number over a lower number. Ideally, your blood pressure should be below 120/80. The first ("top") number is called the systolic pressure. It is a measure of the pressure in your arteries as your heart beats. The second ("bottom") number is called the diastolic pressure. It is a measure of the pressure in your arteries as the heart relaxes. What are the causes? The cause of this condition is not known. What increases the risk? Some risk factors for high blood pressure are under your control. Others are not. Factors you can change  Smoking.  Having type 2 diabetes mellitus, high cholesterol, or both.  Not getting enough exercise or physical activity.  Being overweight.  Having too much fat, sugar, calories, or salt (sodium) in your diet.  Drinking too much alcohol. Factors that are difficult or impossible to change  Having chronic kidney disease.  Having a family history of high blood pressure.  Age. Risk increases with age.  Race. You may be at higher risk if you are African-American.  Gender. Men are at higher risk than women before age 66. After age 9, women are at higher risk than  men.  Having obstructive sleep apnea.  Stress. What are the signs or symptoms? Extremely high blood pressure (hypertensive crisis) may cause:  Headache.  Anxiety.  Shortness of breath.  Nosebleed.  Nausea and vomiting.  Severe chest pain.  Jerky movements you cannot control (seizures). How is this diagnosed? This condition is diagnosed by measuring your blood pressure while you are seated, with your arm resting on a surface. The cuff of the blood pressure monitor will be placed directly against the skin of your upper arm at the level of your heart. It should be measured at least twice using the same arm. Certain conditions can cause a difference in blood pressure between your right and left arms. Certain factors can cause blood pressure readings to be lower or higher than normal (elevated) for a short period of time:  When your blood pressure is higher when you are in a health care provider's office than when you are at home, this is called white coat hypertension. Most people with this condition do not need medicines.  When your blood pressure is higher at home than when you are in a health care provider's office, this is called masked hypertension. Most people with this condition may need medicines to control blood pressure. If you have a high blood pressure reading during one visit or you have normal blood pressure with other risk factors:  You may be asked to return on a different day to have your blood pressure checked again.  You may be asked to monitor your blood  pressure at home for 1 week or longer. If you are diagnosed with hypertension, you may have other blood or imaging tests to help your health care provider understand your overall risk for other conditions. How is this treated? This condition is treated by making healthy lifestyle changes, such as eating healthy foods, exercising more, and reducing your alcohol intake. Your health care provider may prescribe medicine  if lifestyle changes are not enough to get your blood pressure under control, and if:  Your systolic blood pressure is above 130.  Your diastolic blood pressure is above 80. Your personal target blood pressure may vary depending on your medical conditions, your age, and other factors. Follow these instructions at home: Eating and drinking   Eat a diet that is high in fiber and potassium, and low in sodium, added sugar, and fat. An example eating plan is called the DASH (Dietary Approaches to Stop Hypertension) diet. To eat this way: ? Eat plenty of fresh fruits and vegetables. Try to fill half of your plate at each meal with fruits and vegetables. ? Eat whole grains, such as whole wheat pasta, brown rice, or whole grain bread. Fill about one quarter of your plate with whole grains. ? Eat or drink low-fat dairy products, such as skim milk or low-fat yogurt. ? Avoid fatty cuts of meat, processed or cured meats, and poultry with skin. Fill about one quarter of your plate with lean proteins, such as fish, chicken without skin, beans, eggs, and tofu. ? Avoid premade and processed foods. These tend to be higher in sodium, added sugar, and fat.  Reduce your daily sodium intake. Most people with hypertension should eat less than 1,500 mg of sodium a day.  Limit alcohol intake to no more than 1 drink a day for nonpregnant women and 2 drinks a day for men. One drink equals 12 oz of beer, 5 oz of wine, or 1 oz of hard liquor. Lifestyle   Work with your health care provider to maintain a healthy body weight or to lose weight. Ask what an ideal weight is for you.  Get at least 30 minutes of exercise that causes your heart to beat faster (aerobic exercise) most days of the week. Activities may include walking, swimming, or biking.  Include exercise to strengthen your muscles (resistance exercise), such as pilates or lifting weights, as part of your weekly exercise routine. Try to do these types of  exercises for 30 minutes at least 3 days a week.  Do not use any products that contain nicotine or tobacco, such as cigarettes and e-cigarettes. If you need help quitting, ask your health care provider.  Monitor your blood pressure at home as told by your health care provider.  Keep all follow-up visits as told by your health care provider. This is important. Medicines  Take over-the-counter and prescription medicines only as told by your health care provider. Follow directions carefully. Blood pressure medicines must be taken as prescribed.  Do not skip doses of blood pressure medicine. Doing this puts you at risk for problems and can make the medicine less effective.  Ask your health care provider about side effects or reactions to medicines that you should watch for. Contact a health care provider if:  You think you are having a reaction to a medicine you are taking.  You have headaches that keep coming back (recurring).  You feel dizzy.  You have swelling in your ankles.  You have trouble with your vision. Get help  right away if:  You develop a severe headache or confusion.  You have unusual weakness or numbness.  You feel faint.  You have severe pain in your chest or abdomen.  You vomit repeatedly.  You have trouble breathing. Summary  Hypertension is when the force of blood pumping through your arteries is too strong. If this condition is not controlled, it may put you at risk for serious complications.  Your personal target blood pressure may vary depending on your medical conditions, your age, and other factors. For most people, a normal blood pressure is less than 120/80.  Hypertension is treated with lifestyle changes, medicines, or a combination of both. Lifestyle changes include weight loss, eating a healthy, low-sodium diet, exercising more, and limiting alcohol. This information is not intended to replace advice given to you by your health care provider.  Make sure you discuss any questions you have with your health care provider. Document Released: 01/05/2005 Document Revised: 12/04/2015 Document Reviewed: 12/04/2015 Elsevier Interactive Patient Education  2019 [employer].

## 2018-06-14 NOTE — Progress Notes (Signed)
Virtual Visit via Video Note  I connected with Jamie Garza on 06/14/18 at 10:30 AM EDT by a video enabled telemedicine application and verified that I am speaking with the correct person using two identifiers.  Location: Patient: work  Provider: office   I discussed the limitations of evaluation and management by telemedicine and the availability of in person appointments. The patient expressed understanding and agreed to proceed.  History of Present Illness: Pt presents with BP concerns  bp has been trending up  When she went to donate blood 156/80 -unusual for her  She has a cuff and at home 150s/90s 148/85  High at neurologist also  Is nervous there   She has a lot of stress  (esp tax season)   Never smoked Continues to exercise  She walks every day  Also some weights   No headaches except when she walks in the pollen -related to allergies  More hot flashes lately - she does not flush  No cp or sob  No swollen ankles   Ate worse during tax season due to stress She is back to eating well now    Wt Readings from Last 3 Encounters:  09/03/17 136 lb 12.8 oz (62.1 kg)  01/14/17 132 lb 8 oz (60.1 kg)  09/02/16 149 lb 12 oz (67.9 kg)  recent wt 138 lb   Iron was also low last time she donated  Donates every 2 months   BP Readings from Last 3 Encounters:  09/03/17 132/84  01/14/17 102/70  09/02/16 120/78   Mother has HTN -takes medicine   Labs last aug Lab Results  Component Value Date   CREATININE 0.73 08/27/2017   BUN 15 08/27/2017   NA 139 08/27/2017   K 3.5 08/27/2017   CL 104 08/27/2017   CO2 27 08/27/2017   Lab Results  Component Value Date   CHOL 183 08/27/2017   HDL 60.70 08/27/2017   LDLCALC 106 (H) 08/27/2017   LDLDIRECT 137.8 12/03/2009   TRIG 80.0 08/27/2017   CHOLHDL 3 08/27/2017     Review of Systems  Constitutional: Negative for chills, fever, malaise/fatigue and weight loss.  HENT: Negative for hearing loss.   Eyes: Negative  for blurred vision.  Respiratory: Negative for cough, shortness of breath and wheezing.   Cardiovascular: Negative for chest pain, palpitations, claudication and leg swelling.  Gastrointestinal: Negative for abdominal pain.  Musculoskeletal: Negative for joint pain and myalgias.  Skin: Negative for rash.  Neurological: Negative for dizziness, tingling, focal weakness and headaches.  Psychiatric/Behavioral: Negative for depression. The patient is not nervous/anxious.        Stressors- work      Patient Active Problem List   Diagnosis Date Noted  . Essential hypertension 06/14/2018  . Spinal stenosis 09/02/2016  . Hyperlipidemia 09/02/2016  . Depression with anxiety 10/30/2011  . History of radius fracture 05/23/2011  . Encounter for routine gynecological examination 12/19/2010  . Other screening mammogram 12/19/2010  . Routine general medical examination at a health care facility 12/15/2010  . Insomnia 11/20/2008  . SLEEP APNEA 01/04/2007   Past Medical History:  Diagnosis Date  . Distal radius fracture, left 05/23/2011  . Insomnia   . Sleep apnea   . Spinal stenosis, lumbar    L4 and L5 - severe; L3 and L4 - moderate   Past Surgical History:  Procedure Laterality Date  . WRIST FRACTURE SURGERY     Social History   Tobacco Use  . Smoking status: Never  Smoker  . Smokeless tobacco: Never Used  Substance Use Topics  . Alcohol use: Yes    Alcohol/week: 0.0 standard drinks    Comment: occasional  . Drug use: No   Family History  Problem Relation Age of Onset  . Diabetes Mother        borderline DM  . Diabetes Maternal Aunt   . Diabetes Maternal Grandfather   . Diabetes Paternal Grandfather   . Breast cancer Neg Hx    No Known Allergies Current Outpatient Medications on File Prior to Visit  Medication Sig Dispense Refill  . LYSINE HCL PO Take 1 tablet by mouth daily.    . naproxen sodium (ALEVE) 220 MG tablet Take 440 mg by mouth at bedtime as needed.    .  Probiotic Product (PROBIOTIC DAILY PO) Take 1 capsule by mouth 2 (two) times a week.     . traZODone (DESYREL) 50 MG tablet Take 1 tablet (50 mg total) by mouth at bedtime. 90 tablet 3   No current facility-administered medications on file prior to visit.       Observations/Objective: Patient appears well, in no distress Weight is baseline  No facial swelling or asymmetry Normal voice-not hoarse and no slurred speech No obvious tremor or mobility impairment Moving neck and UEs normally Able to hear the call well  No cough or shortness of breath during interview  Talkative and mentally sharp with no cognitive changes No skin changes on face or neck , no rash or pallor Affect is normal -she does not seem anxious at all    Assessment and Plan: Problem List Items Addressed This Visit      Cardiovascular and Mediastinum   Essential hypertension    More than 3 elevated bp readings in a row at home/ other offices Good habits fam hx -mother   Disc DASH eating  Start lisinopril 10 mg daily (disc poss side eff like hypotension or cough)  Continue water/exercise F/u 4-5 weeks  She will check bp at home when relaxed with wrist cuff  Handout given in mychart -HTN  Will plan labs once stable on medication /titrated        Relevant Medications   lisinopril (ZESTRIL) 10 MG tablet       Follow Up Instructions: Start lisinopril 10 mg once daily in the am  Continue good exercise and diet habits Also keep drinking water  Avoid processed foods with a lot of sodium   Your elevated blood pressure no doubt is genetic   If any problems or issues please call  Our office will contact you to schedule a visit in 4-5 weeks    I discussed the assessment and treatment plan with the patient. The patient was provided an opportunity to ask questions and all were answered. The patient agreed with the plan and demonstrated an understanding of the instructions.   The patient was advised to call  back or seek an in-person evaluation if the symptoms worsen or if the condition fails to improve as anticipated.  Roxy MannsMarne Harveen Flesch, MD

## 2018-07-04 ENCOUNTER — Ambulatory Visit: Payer: 59 | Admitting: Family Medicine

## 2018-07-04 ENCOUNTER — Telehealth: Payer: Self-pay | Admitting: Family Medicine

## 2018-07-04 NOTE — Telephone Encounter (Signed)
Yes-please re schedule 2 weeks out  (thanks for letting us know)  If any problem-let me know

## 2018-07-04 NOTE — Telephone Encounter (Signed)
Patient has a 3:15 appt this afternoon.  She called in an stated that she is suppose to come in for a follow up with a BP check today., She was in [place] this weekend and was in a large crowd with no social distancing or mask.   She would like to know if she needs to reschedule this appointment or make it virtual?   BEST PHONE NUMBER- [phone]

## 2018-07-18 ENCOUNTER — Ambulatory Visit: Payer: 59 | Admitting: Family Medicine

## 2018-07-18 ENCOUNTER — Encounter: Payer: Self-pay | Admitting: Family Medicine

## 2018-07-18 ENCOUNTER — Other Ambulatory Visit: Payer: Self-pay

## 2018-07-18 VITALS — BP 136/70 | HR 55 | Temp 97.7°F | Ht 61.5 in | Wt 140.4 lb

## 2018-07-18 DIAGNOSIS — F430 Acute stress reaction: Secondary | ICD-10-CM

## 2018-07-18 DIAGNOSIS — I10 Essential (primary) hypertension: Secondary | ICD-10-CM | POA: Diagnosis not present

## 2018-07-18 HISTORY — DX: Acute stress reaction: F43.0

## 2018-07-18 MED ORDER — LOSARTAN POTASSIUM 50 MG PO TABS: 50.0000 mg | ORAL_TABLET | Freq: Every day | ORAL | 3 refills | Status: DC

## 2018-07-18 NOTE — Assessment & Plan Note (Signed)
Mostly work related She is weathering it well  Exercise is a good coping skill and has support  Enc her to alert [place] if she wants to see a Social worker

## 2018-07-18 NOTE — Assessment & Plan Note (Signed)
bp in fair control at this time  BP Readings from Last 1 Encounters:  07/18/18 136/70   In light of mild cough-will switch from lisinopril to losartan 50 mg daily  inst to update if problems or side effects F/u in august planned  Most recent labs reviewed  Disc lifstyle change with low sodium diet and exercise

## 2018-07-18 NOTE — Progress Notes (Signed)
Subjective:    Patient ID: Jamie Garza, female    DOB: 11-Mar-1955, 63 y.o.   MRN: [id number]  HPI Here for HTN follow up   Is very very stressed at work  Still dealing with tax season since it is extended to July Also understaffed    Wt Readings from Last 3 Encounters:  07/18/18 140 lb 6 oz (63.7 kg)  06/14/18 138 lb (62.6 kg)  09/03/17 136 lb 12.8 oz (62.1 kg)   26.09 kg/m   bp remains high  No cp or palpitations or headaches or edema  No side effects to medicines  BP Readings from Last 3 Encounters:  07/18/18 (!) 158/82  06/14/18 (!) 148/85  09/03/17 132/84     Taking lisinopril 10 mg daily (she does notice a little dry cough)  At home some readings 135/76 139/79 132/69 135/76 140/75 132/74  Usually checks it first thing when she gets up in the am before she goes out to walk   Mother has bad HTN Brother with aortic stenosis   She tries to watch sodium in her diet     Lab Results  Component Value Date   CREATININE 0.73 08/27/2017   BUN 15 08/27/2017   NA 139 08/27/2017   K 3.5 08/27/2017   CL 104 08/27/2017   CO2 27 08/27/2017   Lab Results  Component Value Date   WBC 5.0 08/27/2017   HGB 12.1 08/27/2017   HCT 36.7 08/27/2017   MCV 88.6 08/27/2017   PLT 209.0 08/27/2017   Lab Results  Component Value Date   TSH 1.70 08/27/2017    Lab Results  Component Value Date   CHOL 183 08/27/2017   HDL 60.70 08/27/2017   LDLCALC 106 (H) 08/27/2017   LDLDIRECT 137.8 12/03/2009   TRIG 80.0 08/27/2017   CHOLHDL 3 08/27/2017    Patient Active Problem List   Diagnosis Date Noted  . Essential hypertension 06/14/2018  . Spinal stenosis 09/02/2016  . Hyperlipidemia 09/02/2016  . Depression with anxiety 10/30/2011  . History of radius fracture 05/23/2011  . Encounter for routine gynecological examination 12/19/2010  . Other screening mammogram 12/19/2010  . Routine general medical examination at a health care facility 12/15/2010  . Insomnia  11/20/2008  . SLEEP APNEA 01/04/2007   Past Medical History:  Diagnosis Date  . Distal radius fracture, left 05/23/2011  . Insomnia   . Sleep apnea   . Spinal stenosis, lumbar    L4 and L5 - severe; L3 and L4 - moderate   Past Surgical History:  Procedure Laterality Date  . WRIST FRACTURE SURGERY     Social History   Tobacco Use  . Smoking status: Never Smoker  . Smokeless tobacco: Never Used  Substance Use Topics  . Alcohol use: Yes    Alcohol/week: 0.0 standard drinks    Comment: occasional  . Drug use: No   Family History  Problem Relation Age of Onset  . Diabetes Mother        borderline DM  . Diabetes Maternal Aunt   . Diabetes Maternal Grandfather   . Diabetes Paternal Grandfather   . Breast cancer Neg Hx    No Known Allergies Current Outpatient Medications on File Prior to Visit  Medication Sig Dispense Refill  . LYSINE HCL PO Take 1 tablet by mouth daily.    . naproxen sodium (ALEVE) 220 MG tablet Take 440 mg by mouth at bedtime as needed.    . Probiotic Product (PROBIOTIC DAILY PO)  Take 1 capsule by mouth 2 (two) times a week.     . traZODone (DESYREL) 50 MG tablet Take 1 tablet (50 mg total) by mouth at bedtime. 90 tablet 3   No current facility-administered medications on file prior to visit.     Review of Systems  Constitutional: Negative for activity change, appetite change, fatigue, fever and unexpected weight change.  HENT: Negative for congestion, ear pain, rhinorrhea, sinus pressure and sore throat.   Eyes: Negative for pain, redness and visual disturbance.  Respiratory: Negative for cough, shortness of breath and wheezing.   Cardiovascular: Negative for chest pain and palpitations.  Gastrointestinal: Negative for abdominal pain, blood in stool, constipation and diarrhea.  Endocrine: Negative for polydipsia and polyuria.  Genitourinary: Negative for dysuria, frequency and urgency.  Musculoskeletal: Negative for arthralgias, back pain and myalgias.   Skin: Negative for pallor and rash.  Allergic/Immunologic: Negative for environmental allergies.  Neurological: Negative for dizziness, syncope and headaches.  Hematological: Negative for adenopathy. Does not bruise/bleed easily.  Psychiatric/Behavioral: Negative for decreased concentration and dysphoric mood. The patient is nervous/anxious.        Stressors with some anxiety        Objective:   Physical Exam Constitutional:      General: She is not in acute distress.    Appearance: She is well-developed.  HENT:     Head: Normocephalic and atraumatic.  Eyes:     Conjunctiva/sclera: Conjunctivae normal.     Pupils: Pupils are equal, round, and reactive to light.  Neck:     Musculoskeletal: Normal range of motion and neck supple.     Thyroid: No thyromegaly.     Vascular: No carotid bruit or JVD.  Cardiovascular:     Rate and Rhythm: Normal rate and regular rhythm.     Heart sounds: Normal heart sounds. No gallop.   Pulmonary:     Effort: Pulmonary effort is normal. No respiratory distress.     Breath sounds: Normal breath sounds. No wheezing or rales.  Abdominal:     General: Bowel sounds are normal. There is no distension or abdominal bruit.     Palpations: Abdomen is soft. There is no mass.     Tenderness: There is no abdominal tenderness.  Lymphadenopathy:     Cervical: No cervical adenopathy.  Skin:    General: Skin is warm and dry.     Findings: No rash.  Neurological:     Mental Status: She is alert. Mental status is at baseline.     Coordination: Coordination normal.     Gait: Gait normal.     Deep Tendon Reflexes: Reflexes are normal and symmetric. Reflexes normal.  Psychiatric:        Mood and Affect: Mood normal.     Comments: Voices stressors but mood is good           Assessment & Plan:   Problem List Items Addressed This Visit      Cardiovascular and Mediastinum   Essential hypertension - Primary    bp in fair control at this time  BP Readings  from Last 1 Encounters:  07/18/18 136/70   In light of mild cough-will switch from lisinopril to losartan 50 mg daily  inst to update if problems or side effects F/u in august planned  Most recent labs reviewed  Disc lifstyle change with low sodium diet and exercise        Relevant Medications   losartan (COZAAR) 50 MG tablet  Other   Stress reaction    Mostly work related She is weathering it well  Exercise is a good coping skill and has support  Enc her to alert us if she wants to see a counselor

## 2018-07-18 NOTE — Patient Instructions (Signed)
Continue to watch sodium in your diet and drink water  Also exercise / walk and stay active (this is good for stress and blood pressure)  BP: 136/70 -better today  When you finish to lisinopril start the losartan (this should not cause a cough)  If any problems let us know  If you want to see a counselor for stress in the future also let me knwo

## 2018-07-29 ENCOUNTER — Telehealth: Payer: Self-pay | Admitting: Family Medicine

## 2018-07-29 DIAGNOSIS — I10 Essential (primary) hypertension: Secondary | ICD-10-CM

## 2018-07-29 DIAGNOSIS — Z8249 Family history of ischemic heart disease and other diseases of the circulatory system: Secondary | ICD-10-CM

## 2018-07-29 NOTE — Telephone Encounter (Signed)
Go up on losartan to 100 mg daily over the counter and see how she does over the weekend Let us know on Monday please

## 2018-07-29 NOTE — Telephone Encounter (Signed)
BP meds switched last OV from Lisinopril to Losartan - Pt c/o elevated BP since med change. Ranges 158/80s, 162/80s, 150/80s x 1 day, patient checked BP because she was not feeling well.   Not feeling well - little Headache, lightheaded and dizzy, "hot flash", no fever.   This morning BP was 148/81 - dull headache today, not terrible.   Pt is concerned bc her BP seems to be going up rather than coming down to a controlled range.

## 2018-07-29 NOTE — Telephone Encounter (Signed)
Pt notified of Dr. Marliss Coots comments, pt will take 2 tabs qd and update [place] on Monday

## 2018-08-01 NOTE — Telephone Encounter (Signed)
Pt did increase losartan to 100 mg daily on 07/30/18.  And pt said on 07/10/20BP was 147/81 P 58 On 07/30/18 BP was 147/83 P 65 On 07/31/18 BP was  149/77 P 50 On 08/01/18 BP was  132/71 P 60  Pt said she feels better today; pt has not had anymore lightheadedness; pt is still feeling stressed out and anxious about BP going up. Pt said last night felt like having hot flashes. Pt is also under stress at work. Pt said she had tightness in chest 2 wks ago. When pt was in [place] she felt a lot better and wonders if that tightness could be stress related. Pt said she has had discussed this with Dr Glori Bickers before but wonders if should have testing of her heart. [facility] on [address].

## 2018-08-01 NOTE — Telephone Encounter (Signed)
LM on cell (okay per DPR) for patient to call back so we can address her concerns regarding her blood pressure.

## 2018-08-01 NOTE — Telephone Encounter (Signed)
Blood pressures look to be improving- please give it another week and get [place] some more readings (thanks)  If she needs px sent in the the higher dose losartan please do so Stress can certainly contribute (and if she wants to see a counselor at any point please let me know)  It takes years of elevated BP to cause vascular/heart problems -so that is re assuring If she ever wants to see a cardiologist to check in however that is fine - so let me know if she would like to do that.  They would talk to her and see if stress testing /or other diagnostic work would be warranted

## 2018-08-05 DIAGNOSIS — Z8249 Family history of ischemic heart disease and other diseases of the circulatory system: Secondary | ICD-10-CM | POA: Insufficient documentation

## 2018-08-05 NOTE — Telephone Encounter (Signed)
Ref done Will send to [facility] 

## 2018-08-05 NOTE — Addendum Note (Signed)
Addended by: Loura Pardon A on: 08/05/2018 04:58 PM   Modules accepted: Orders

## 2018-08-05 NOTE — Telephone Encounter (Addendum)
Pt notified of Dr. Marliss Coots comments pt said last 2 BP readings were 115/70 pulse 55 and 120/75 pulse 60, pt will get a few more and asked me to f/u with her on Monday to let us know what they are.   Pt wanted me to ask Dr. Glori Bickers if it is a good idea for her to see a cardiologist given everything going on and also her mother was recently diagnosed with aortic stenosis and, also her twin brother who also had to have a valve replaced recently too. Pt said given the family history and her BP issues does Dr. Glori Bickers think she should see a cardiologist to get a "baseline" of her heart, pt said if so she would like to see someone in [place]

## 2018-08-08 NOTE — Telephone Encounter (Signed)
Spoke to patient and her BP readings are as follows: 7/14-115/70 HR-56, 7/15-122/75 HR-63, 7/16-121/76 HR60, 7/18-124/73 HR64, 7/19-129/78 HR71, 7/20 134/75 HR 64 °

## 2018-08-08 NOTE — Telephone Encounter (Signed)
Good BP control  Stay on losartan 100 mg daily  Please send in refills for year 100 mg 1 po qd (she is currently doubling up on her 79s) Thanks

## 2018-08-09 MED ORDER — LOSARTAN POTASSIUM 100 MG PO TABS: 100.0000 mg | ORAL_TABLET | Freq: Every day | ORAL | 3 refills | Status: DC

## 2018-08-09 NOTE — Telephone Encounter (Signed)
Pt notified of Dr. Marliss Coots comments and verbalized understanding. Pt did want me to let Dr. Glori Bickers know that she donated blood yesterday and before she took her BP med her BP was 107/70 (left arm), and 111/17 (right arm), pt didn't want to take a full 100mg  dose since BP was low so she took 1 50mg  tablet. I told Dr. Glori Bickers and she said that was okay and that donating blood can sometimes drop BP a little low and to advised pt to restart BP med tomorrow. Pt verbalized understanding

## 2018-08-09 NOTE — Addendum Note (Signed)
Addended by: Tammi Sou on: 08/09/2018 09:54 AM   Modules accepted: Orders

## 2018-08-29 ENCOUNTER — Other Ambulatory Visit: Payer: Self-pay | Admitting: Family Medicine

## 2018-08-29 DIAGNOSIS — Z1231 Encounter for screening mammogram for malignant neoplasm of breast: Secondary | ICD-10-CM

## 2018-09-05 ENCOUNTER — Telehealth: Payer: Self-pay | Admitting: Family Medicine

## 2018-09-05 DIAGNOSIS — I10 Essential (primary) hypertension: Secondary | ICD-10-CM

## 2018-09-05 DIAGNOSIS — E7800 Pure hypercholesterolemia, unspecified: Secondary | ICD-10-CM

## 2018-09-05 DIAGNOSIS — Z0000 Encounter for general adult medical examination without abnormal findings: Secondary | ICD-10-CM

## 2018-09-05 NOTE — Telephone Encounter (Signed)
-----   Message from Ellamae Sia sent at 08/30/2018 11:30 AM EDT ----- Regarding: Lab orders for Tuesday, 8.18.20 Patient is scheduled for CPX labs, please order future labs, Thanks , Karna Christmas

## 2018-09-06 ENCOUNTER — Other Ambulatory Visit (INDEPENDENT_AMBULATORY_CARE_PROVIDER_SITE_OTHER): Payer: 59

## 2018-09-06 ENCOUNTER — Other Ambulatory Visit: Payer: Self-pay

## 2018-09-06 DIAGNOSIS — I10 Essential (primary) hypertension: Secondary | ICD-10-CM

## 2018-09-06 DIAGNOSIS — E7800 Pure hypercholesterolemia, unspecified: Secondary | ICD-10-CM

## 2018-09-06 LAB — CBC WITH DIFFERENTIAL/PLATELET
Basophils Absolute: 0 10*3/uL (ref 0.0–0.1)
Basophils Relative: 0.5 % (ref 0.0–3.0)
Eosinophils Absolute: 0.1 10*3/uL (ref 0.0–0.7)
Eosinophils Relative: 2.5 % (ref 0.0–5.0)
HCT: 35.9 % — ABNORMAL LOW (ref 36.0–46.0)
Hemoglobin: 11.8 g/dL — ABNORMAL LOW (ref 12.0–15.0)
Lymphocytes Relative: 53 % — ABNORMAL HIGH (ref 12.0–46.0)
Lymphs Abs: 2.6 10*3/uL (ref 0.7–4.0)
MCHC: 32.8 g/dL (ref 30.0–36.0)
MCV: 92.7 fl (ref 78.0–100.0)
Monocytes Absolute: 0.6 10*3/uL (ref 0.1–1.0)
Monocytes Relative: 12 % (ref 3.0–12.0)
Neutro Abs: 1.6 10*3/uL (ref 1.4–7.7)
Neutrophils Relative %: 32 % — ABNORMAL LOW (ref 43.0–77.0)
Platelets: 231 10*3/uL (ref 150.0–400.0)
RBC: 3.87 Mil/uL (ref 3.87–5.11)
RDW: 13.3 % (ref 11.5–15.5)
WBC: 5 10*3/uL (ref 4.0–10.5)

## 2018-09-06 LAB — LIPID PANEL
Cholesterol: 179 mg/dL (ref 0–200)
HDL: 46.6 mg/dL (ref >39.00)
LDL Cholesterol: 106 mg/dL — ABNORMAL HIGH (ref 0–99)
NonHDL: 131.95
Total CHOL/HDL Ratio: 4
Triglycerides: 130 mg/dL (ref 0.0–149.0)
VLDL: 26 mg/dL (ref 0.0–40.0)

## 2018-09-06 LAB — COMPREHENSIVE METABOLIC PANEL
ALT: 17 U/L (ref 0–35)
AST: 20 U/L (ref 0–37)
Albumin: 4 g/dL (ref 3.5–5.2)
Alkaline Phosphatase: 59 U/L (ref 39–117)
BUN: 15 mg/dL (ref 6–23)
CO2: 28 mEq/L (ref 19–32)
Calcium: 9.3 mg/dL (ref 8.4–10.5)
Chloride: 103 mEq/L (ref 96–112)
Creatinine, Ser: 0.63 mg/dL (ref 0.40–1.20)
GFR: 95.17 mL/min (ref >60.00)
Glucose, Bld: 91 mg/dL (ref 70–99)
Potassium: 3.8 mEq/L (ref 3.5–5.1)
Sodium: 138 mEq/L (ref 135–145)
Total Bilirubin: 0.4 mg/dL (ref 0.2–1.2)
Total Protein: 7.2 g/dL (ref 6.0–8.3)

## 2018-09-06 LAB — TSH: TSH: 2.39 u[IU]/mL (ref 0.35–4.50)

## 2018-09-09 ENCOUNTER — Ambulatory Visit (INDEPENDENT_AMBULATORY_CARE_PROVIDER_SITE_OTHER): Payer: 59 | Admitting: Family Medicine

## 2018-09-09 ENCOUNTER — Encounter: Payer: Self-pay | Admitting: Family Medicine

## 2018-09-09 ENCOUNTER — Other Ambulatory Visit: Payer: Self-pay

## 2018-09-09 VITALS — BP 126/80 | HR 60 | Temp 99.6°F | Ht 61.5 in | Wt 140.0 lb

## 2018-09-09 DIAGNOSIS — E7800 Pure hypercholesterolemia, unspecified: Secondary | ICD-10-CM | POA: Diagnosis not present

## 2018-09-09 DIAGNOSIS — Z0000 Encounter for general adult medical examination without abnormal findings: Secondary | ICD-10-CM | POA: Diagnosis not present

## 2018-09-09 DIAGNOSIS — F5104 Psychophysiologic insomnia: Secondary | ICD-10-CM

## 2018-09-09 DIAGNOSIS — I10 Essential (primary) hypertension: Secondary | ICD-10-CM | POA: Diagnosis not present

## 2018-09-09 MED ORDER — TRAZODONE HCL 50 MG PO TABS: 50.0000 mg | ORAL_TABLET | Freq: Every day | ORAL | 3 refills | Status: DC

## 2018-09-09 NOTE — Patient Instructions (Addendum)
You are due for a tetanus shot  Also flu shot in the fall  Schedule a nurse visit for those when you are ready   Keep getting back on track with exercise   For cholesterol : Avoid red meat/ fried foods/ egg yolks/ fatty breakfast meats/ butter, cheese and high fat dairy/ and shellfish    Take care of yourself

## 2018-09-09 NOTE — Progress Notes (Signed)
Subjective:    Patient ID: Jamie Garza, female    DOB: 12/22/1955, 63 y.o.   MRN: [id number]  HPI Here for health maintenance exam and to review chronic medical problems    Doing ok with pandemic  Cannot visit parents in a facility -this is stressful Being careful    Wt Readings from Last 3 Encounters:  09/09/18 140 lb (63.5 kg)  07/18/18 140 lb 6 oz (63.7 kg)  06/14/18 138 lb (62.6 kg)  stable  Exercises regularly -walks early  26.02 kg/m   [place] [facility] [place] planned oct [place]   Flu vaccine --she will wait a month   Mammogram 9/19 -already has the appt scheduled  Self breast exam -no lumps   Td 1/10   Colonoscopy 8/11 Due in a year   Pap 8/18 -neg pap with neg HPV screen  No concerns or symptoms  No need for STD screen   shingrix vaccine-just had 2 weeks ago (first one)   bp is stable today  No cp or palpitations or headaches or edema  No side effects to medicines  BP Readings from Last 3 Encounters:  09/09/18 126/80  07/18/18 136/70  06/14/18 (!) 148/85      Lab Results  Component Value Date   CREATININE 0.63 09/06/2018   BUN 15 09/06/2018   NA 138 09/06/2018   K 3.8 09/06/2018   CL 103 09/06/2018   CO2 28 09/06/2018   Lab Results  Component Value Date   ALT 17 09/06/2018   AST 20 09/06/2018   ALKPHOS 59 09/06/2018   BILITOT 0.4 09/06/2018   Lab Results  Component Value Date   WBC 5.0 09/06/2018   HGB 11.8 (L) 09/06/2018   HCT 35.9 (L) 09/06/2018   MCV 92.7 09/06/2018   PLT 231.0 09/06/2018   she gave blood this mo This is often a little low after   Hyperlipidemia Lab Results  Component Value Date   CHOL 179 09/06/2018   CHOL 183 08/27/2017   CHOL 217 (H) 08/28/2016   Lab Results  Component Value Date   HDL 46.60 09/06/2018   HDL 60.70 08/27/2017   HDL 61.80 08/28/2016   Lab Results  Component Value Date   LDLCALC 106 (H) 09/06/2018   LDLCALC 106 (H) 08/27/2017   LDLCALC 140 (H) 08/28/2016    Lab Results  Component Value Date   TRIG 130.0 09/06/2018   TRIG 80.0 08/27/2017   TRIG 73.0 08/28/2016   Lab Results  Component Value Date   CHOLHDL 4 09/06/2018   CHOLHDL 3 08/27/2017   CHOLHDL 4 08/28/2016   Lab Results  Component Value Date   LDLDIRECT 137.8 12/03/2009   HDL is down significantly  Not exercising as much since gym is closed  Also during tax season-worse eating   Patient Active Problem List   Diagnosis Date Noted  . Family history of heart disease 08/05/2018  . Stress reaction 07/18/2018  . Essential hypertension 06/14/2018  . Spinal stenosis 09/02/2016  . Hyperlipidemia 09/02/2016  . Depression with anxiety 10/30/2011  . History of radius fracture 05/23/2011  . Encounter for routine gynecological examination 12/19/2010  . Other screening mammogram 12/19/2010  . Routine general medical examination at a health care facility 12/15/2010  . Insomnia 11/20/2008  . SLEEP APNEA 01/04/2007   Past Medical History:  Diagnosis Date  . Distal radius fracture, left 05/23/2011  . Insomnia   . Sleep apnea   . Spinal stenosis,  lumbar    L4 and L5 - severe; L3 and L4 - moderate   Past Surgical History:  Procedure Laterality Date  . WRIST FRACTURE SURGERY     Social History   Tobacco Use  . Smoking status: Never Smoker  . Smokeless tobacco: Never Used  Substance Use Topics  . Alcohol use: Yes    Alcohol/week: 0.0 standard drinks    Comment: occasional  . Drug use: No   Family History  Problem Relation Age of Onset  . Diabetes Mother        borderline DM  . Diabetes Maternal Aunt   . Diabetes Maternal Grandfather   . Diabetes Paternal Grandfather   . Breast cancer Neg Hx    No Known Allergies Current Outpatient Medications on File Prior to Visit  Medication Sig Dispense Refill  . losartan (COZAAR) 100 MG tablet Take 1 tablet (100 mg total) by mouth daily. 90 tablet 3  . LYSINE HCL PO Take 1 tablet by mouth daily.    . naproxen sodium (ALEVE)  220 MG tablet Take 440 mg by mouth at bedtime as needed.    . Probiotic Product (PROBIOTIC DAILY PO) Take 1 capsule by mouth 2 (two) times a week.      No current facility-administered medications on file prior to visit.     Review of Systems  Constitutional: Negative for activity change, appetite change, fatigue, fever and unexpected weight change.  HENT: Negative for congestion, ear pain, rhinorrhea, sinus pressure and sore throat.   Eyes: Negative for pain, redness and visual disturbance.  Respiratory: Negative for cough, shortness of breath and wheezing.   Cardiovascular: Negative for chest pain and palpitations.  Gastrointestinal: Negative for abdominal pain, blood in stool, constipation and diarrhea.  Endocrine: Negative for polydipsia and polyuria.  Genitourinary: Negative for dysuria, frequency and urgency.  Musculoskeletal: Negative for arthralgias, back pain and myalgias.  Skin: Negative for pallor and rash.  Allergic/Immunologic: Negative for environmental allergies.  Neurological: Negative for dizziness, syncope and headaches.  Hematological: Negative for adenopathy. Does not bruise/bleed easily.  Psychiatric/Behavioral: Negative for decreased concentration and dysphoric mood. The patient is not nervous/anxious.        Objective:   Physical Exam Constitutional:      General: She is not in acute distress.    Appearance: Normal appearance. She is well-developed and normal weight. She is not ill-appearing.  HENT:     Head: Normocephalic and atraumatic.     Right Ear: Tympanic membrane, ear canal and external ear normal.     Left Ear: Tympanic membrane, ear canal and external ear normal.     Mouth/Throat:     Mouth: Mucous membranes are moist.     Pharynx: Oropharynx is clear. No posterior oropharyngeal erythema.  Eyes:     General: No scleral icterus.    Conjunctiva/sclera: Conjunctivae normal.     Pupils: Pupils are equal, round, and reactive to light.  Neck:      Musculoskeletal: Normal range of motion and neck supple. No neck rigidity or muscular tenderness.     Thyroid: No thyromegaly.     Vascular: No carotid bruit or JVD.  Cardiovascular:     Rate and Rhythm: Normal rate and regular rhythm.     Heart sounds: Normal heart sounds. No gallop.   Pulmonary:     Effort: Pulmonary effort is normal. No respiratory distress.     Breath sounds: Normal breath sounds. No wheezing or rales.     Comments: Good air  exch Chest:     Chest wall: No tenderness.  Abdominal:     General: Bowel sounds are normal. There is no distension or abdominal bruit.     Palpations: Abdomen is soft. There is no mass.     Tenderness: There is no abdominal tenderness.     Hernia: No hernia is present.  Genitourinary:    Comments: Breast exam: No mass, nodules, thickening, tenderness, bulging, retraction, inflamation, nipple discharge or skin changes noted.  No axillary or clavicular LA.     Musculoskeletal: Normal range of motion.        General: No tenderness.  Lymphadenopathy:     Cervical: No cervical adenopathy.  Skin:    General: Skin is warm and dry.     Coloration: Skin is not pale.     Findings: No erythema or rash.  Neurological:     Mental Status: She is alert.     Cranial Nerves: No cranial nerve deficit.     Motor: No abnormal muscle tone.     Coordination: Coordination normal.     Gait: Gait normal.     Deep Tendon Reflexes: Reflexes are normal and symmetric. Reflexes normal.  Psychiatric:        Mood and Affect: Mood normal.           Assessment & Plan:   Problem List Items Addressed This Visit      Cardiovascular and Mediastinum   Essential hypertension    bp in fair control at this time  BP Readings from Last 1 Encounters:  09/09/18 126/80   No changes needed Most recent labs reviewed  Disc lifstyle change with low sodium diet and exercise          Other   Insomnia    Refill trazodone 50 mg  Disc sleep hygiene       Routine  general medical examination at a health care facility - Primary    Reviewed health habits including diet and exercise and skin cancer prevention Reviewed appropriate screening tests for age  Also reviewed health mt list, fam hx and immunization status , as well as social and family history   See HPI Labs reviewed Disc ways to get cholesterol down Plans to inc exercise  Due for tetanus shot-wants to return later for this and flu shot (will call for nurse visit)        Hyperlipidemia    Disc goals for lipids and reasons to control them Rev last labs with pt Rev low sat fat diet in detail LDL of 106 Would like it under 100  Also HDL down-will work on more exercise

## 2018-09-09 NOTE — Assessment & Plan Note (Signed)
bp in fair control at this time  BP Readings from Last 1 Encounters:  09/09/18 126/80   No changes needed Most recent labs reviewed  Disc lifstyle change with low sodium diet and exercise

## 2018-09-09 NOTE — Assessment & Plan Note (Signed)
Disc goals for lipids and reasons to control them Rev last labs with pt Rev low sat fat diet in detail LDL of 106 Would like it under 100  Also HDL down-will work on more exercise

## 2018-09-09 NOTE — Assessment & Plan Note (Signed)
Refill trazodone 50 mg  Disc sleep hygiene

## 2018-09-11 NOTE — Assessment & Plan Note (Signed)
Reviewed health habits including diet and exercise and skin cancer prevention Reviewed appropriate screening tests for age  Also reviewed health mt list, fam hx and immunization status , as well as social and family history   See HPI Labs reviewed Disc ways to get cholesterol down Plans to inc exercise  Due for tetanus shot-wants to return later for this and flu shot (will call for nurse visit)

## 2018-09-20 ENCOUNTER — Telehealth: Payer: Self-pay

## 2018-09-20 NOTE — Telephone Encounter (Signed)

## 2018-09-21 NOTE — Progress Notes (Signed)
Virtual Visit via Video Note   This visit type was conducted due to national recommendations for restrictions regarding the COVID-19 Pandemic (e.g. social distancing) in an effort to limit this patient's exposure and mitigate transmission in our community.  Due to her co-morbid illnesses, this patient is at least at moderate risk for complications without adequate follow up.  This format is felt to be most appropriate for this patient at this time.  All issues noted in this document were discussed and addressed.  A limited physical exam was performed with this format.  Please refer to the patient's chart for her consent to telehealth for [facility].   Evaluation Performed:  Cardiology Consult  This visit type was conducted due to national recommendations for restrictions regarding the COVID-19 Pandemic (e.g. social distancing).  This format is felt to be most appropriate for this patient at this time.  All issues noted in this document were discussed and addressed.  No physical exam was performed (except for noted visual exam findings with Video Visits).  Please refer to the patient's chart (MyChart message for video visits and phone note for telephone visits) for the patient's consent to telehealth for [facility].  Date:  09/22/2018   ID:  Jamie Garza, DOB [birth year], MRN [id number]  Patient Location:  Home  Provider location:   [place]  PCP:  Abner Greenspan, MD  Cardiology:  NEW Electrophysiologist:  None   Chief Complaint:  HTN and fm hx of CAD  History of Present Illness:    Jamie Garza is a 63 y.o. female who presents via audio/video conferencing for a telehealth visit today in referral by Loura Pardon, MD for evaluation of HTN and fm hx of CAD.   This is a very pleasant 63yo female with a hx of HTN, HLD, fm hx of CAD and hx of OSA.  Her BP has been under fair control per last OV note with her PCP at 126/63mmHg.  She has never smoked. She is very concerned since her  brother had CAD as well as AS and her mother has had aortic valve problems.  She has had some intermittent chest pressure but thinks it is related to stress.  She was working out at [employer] prior to [employer] without any problems.  She says that the discomfort is a pressure with no radiation and no associated sx of SOB, N, diaphoresis. She denies any SOB, DOE, PND, orthopnea, LE edema, dizziness, palpitations or syncope. She is compliant with her meds and is tolerating meds with no SE.    The patient does not have symptoms concerning for COVID-19 infection (fever, chills, cough, or new shortness of breath).    Prior CV studies:   The following studies were reviewed today:  none  Past Medical History:  Diagnosis Date  . Depression with anxiety 10/30/2011  . Distal radius fracture, left 05/23/2011  . Essential hypertension 06/14/2018  . Family history of heart disease 08/05/2018  . History of radius fracture 05/23/2011   Of note- nl dexa 2013-not a fragility fracture   . Hyperlipidemia 09/02/2016  . Insomnia   . Sleep apnea   . SLEEP APNEA 01/04/2007   Qualifier: Diagnosis of  By: Maxie Better FNP, Rosalita Levan   . Spinal stenosis 09/02/2016   lumbar  . Spinal stenosis, lumbar    L4 and L5 - severe; L3 and L4 - moderate  . Stress reaction 07/18/2018   Past Surgical History:  Procedure Laterality Date  . WRIST  FRACTURE SURGERY       No outpatient medications have been marked as taking for the 09/22/18 encounter (Telemedicine) with Quintella Reichert, MD.     Allergies:   Patient has no known allergies.   Social History   Tobacco Use  . Smoking status: Never Smoker  . Smokeless tobacco: Never Used  Substance Use Topics  . Alcohol use: Yes    Alcohol/week: 0.0 standard drinks    Comment: occasional  . Drug use: No     Family Hx: The patient's family history includes Diabetes in her maternal aunt, maternal grandfather, mother, and paternal grandfather. There is no history of Breast cancer.   ROS:   Please see the history of present illness.     All other systems reviewed and are negative.   Labs/Other Tests and Data Reviewed:    Recent Labs: 09/06/2018: ALT 17; BUN 15; Creatinine, Ser 0.63; Hemoglobin 11.8; Platelets 231.0; Potassium 3.8; Sodium 138; TSH 2.39   Recent Lipid Panel Lab Results  Component Value Date/Time   CHOL 179 09/06/2018 07:32 AM   TRIG 130.0 09/06/2018 07:32 AM   HDL 46.60 09/06/2018 07:32 AM   CHOLHDL 4 09/06/2018 07:32 AM   LDLCALC 106 (H) 09/06/2018 07:32 AM   LDLDIRECT 137.8 12/03/2009 08:54 AM    Wt Readings from Last 3 Encounters:  09/22/18 140 lb (63.5 kg)  09/09/18 140 lb (63.5 kg)  07/18/18 140 lb 6 oz (63.7 kg)     Objective:    Vital Signs:  BP 132/78 (BP Location: Right Arm, Patient Position: Sitting, Cuff Size: Normal)   Pulse 67   Wt 140 lb (63.5 kg)   BMI 26.02 kg/m    CONSTITUTIONAL:  Well nourished, well developed female in no acute distress.  EYES: anicteric MOUTH: oral mucosa is pink RESPIRATORY: Normal respiratory effort, symmetric expansion CARDIOVASCULAR: No peripheral edema SKIN: No rash, lesions or ulcers MUSCULOSKELETAL: no digital cyanosis NEURO: Cranial Nerves II-XII grossly intact, moves all extremities PSYCH: Intact judgement and insight.  A&O x 3, Mood/affect appropriate   ASSESSMENT & PLAN:    1.  Family hx of CAD-  She is asymptomatic but has a Framingham 10 yr risk score of 8% which is low.  Since she has had some atypical CP I feel we should proceed with coronary CTA with FFR for further assessment.  If significant CAD or coronary Ca is present then would recommend statin therapy. I will also get a 2D echo to assess for AV disease given that her mom and brother have AS.  2.  Hypertension - her BP is under fairly good control.  She will continue on Losartan 100mg  daily.  Her last creatinine was 0.63 last month.   3.  Hyperlipidemia - LDL was 106 so close to goal of < 100.  She is not on lipid  lowering drug.    COVID-19 Education: The signs and symptoms of COVID-19 were discussed with the patient and how to seek care for testing (follow up with PCP or arrange E-visit).  The importance of social distancing was discussed today.  Patient Risk:   After full review of this patient's clinical status, I feel that they are at least moderate risk at this time.  Time:   Today, I have spent 20 minutes directly with the patient on telemedicine discussing medical problems including fm hx of CAD.  We also reviewed the symptoms of COVID 19 and the ways to protect against contracting the virus with telehealth technology.  I  spent an additional 5 minutes reviewing patient's chart including labs.  Medication Adjustments/Labs and Tests Ordered: Current medicines are reviewed at length with the patient today.  Concerns regarding medicines are outlined above.  Tests Ordered: No orders of the defined types were placed in this encounter.  Medication Changes: No orders of the defined types were placed in this encounter.   Disposition:  Follow up prn  Signed, Armanda Magicraci Ashkan Chamberland, MD  09/22/2018 9:40 AM    [facility]

## 2018-09-22 ENCOUNTER — Encounter: Payer: Self-pay | Admitting: Cardiology

## 2018-09-22 ENCOUNTER — Other Ambulatory Visit: Payer: Self-pay

## 2018-09-22 ENCOUNTER — Telehealth (INDEPENDENT_AMBULATORY_CARE_PROVIDER_SITE_OTHER): Payer: 59 | Admitting: Cardiology

## 2018-09-22 VITALS — BP 132/78 | HR 67 | Wt 140.0 lb

## 2018-09-22 DIAGNOSIS — I10 Essential (primary) hypertension: Secondary | ICD-10-CM | POA: Diagnosis not present

## 2018-09-22 DIAGNOSIS — E7800 Pure hypercholesterolemia, unspecified: Secondary | ICD-10-CM | POA: Diagnosis not present

## 2018-09-22 DIAGNOSIS — Z8249 Family history of ischemic heart disease and other diseases of the circulatory system: Secondary | ICD-10-CM

## 2018-09-22 DIAGNOSIS — R079 Chest pain, unspecified: Secondary | ICD-10-CM

## 2018-09-22 MED ORDER — METOPROLOL TARTRATE 100 MG PO TABS: 100.0000 mg | ORAL_TABLET | Freq: Once | ORAL | 0 refills | Status: DC

## 2018-09-22 NOTE — Patient Instructions (Signed)
Medication Instructions:  Your physician recommends that you continue on your current medications as directed. Please refer to the Current Medication list given to you today.  If you need a refill on your cardiac medications before your next appointment, please call your pharmacy.   Testing/Procedures: Your cardiac CT will be scheduled at one of the below locations:   [facility] [address] [place], [place] [address]  Please arrive at the [facility] main entrance of [facility] 30-45 minutes prior to test start time. Proceed to the [facility] Radiology Department (first floor) to check-in and test prep.  On the Night Before the Test: . Be sure to Drink plenty of water. . Do not consume any caffeinated/decaffeinated beverages or chocolate 12 hours prior to your test. . Do not take any antihistamines 12 hours prior to your test.  On the Day of the Test: . Drink plenty of water. Do not drink any water within one hour of the test. . Do not eat any food 4 hours prior to the test. . You may take your regular medications prior to the test.  . Take metoprolol (Lopressor) two hours prior to test.... sent to your pharmacy/ only take if you heart rate is greater than 55 otherwise just bring to your appointment.  Marland Kitchen FEMALES- please wear underwire-free bra if available       After the Test: . Drink plenty of water. . After receiving IV contrast, you may experience a mild flushed feeling. This is normal. . On occasion, you may experience a mild rash up to 24 hours after the test. This is not dangerous. If this occurs, you can take Benadryl 25 mg and increase your fluid intake. . If you experience trouble breathing, this can be serious. If it is severe call 911 IMMEDIATELY. If it is mild, please call our office.     Please contact the cardiac imaging nurse navigator should you have any questions/concerns Marchia Bond, RN Navigator Cardiac Imaging [facility] [phone] Office  [phone] Cell    Your physician has requested that you have an echocardiogram. Echocardiography is a painless test that uses sound waves to create images of your heart. It provides your doctor with information about the size and shape of your heart and how well your heart's chambers and valves are working. This procedure takes approximately one hour. There are no restrictions for this procedure.    Follow-Up: At [facility], you and your health needs are our priority.  As part of our continuing mission to provide you with exceptional heart care, we have created designated Provider Care Teams.  These Care Teams include your primary Cardiologist (physician) and Advanced Practice Providers (APPs -  Physician Assistants and Nurse Practitioners) who all work together to provide you with the care you need, when you need it.

## 2018-09-22 NOTE — Addendum Note (Signed)
Addended by: Stephani Police on: 09/22/2018 12:25 PM   Modules accepted: Orders

## 2018-09-27 ENCOUNTER — Encounter (HOSPITAL_COMMUNITY): Payer: Self-pay | Admitting: Cardiology

## 2018-09-27 ENCOUNTER — Telehealth (HOSPITAL_COMMUNITY): Payer: Self-pay | Admitting: Emergency Medicine

## 2018-09-27 NOTE — Telephone Encounter (Signed)
Reaching out to patient to offer assistance regarding upcoming cardiac imaging study; pt verbalizes understanding of appt date/time, parking situation and where to check in, pre-test NPO status and medications ordered, and verified current allergies; name and call back number provided for further questions should they arise Breonia Kirstein RN Navigator Cardiac Imaging [facility] [phone] office [phone] cell 

## 2018-09-28 ENCOUNTER — Telehealth: Payer: Self-pay | Admitting: Cardiology

## 2018-09-28 ENCOUNTER — Telehealth: Payer: Self-pay

## 2018-09-28 ENCOUNTER — Ambulatory Visit
Admission: RE | Admit: 2018-09-28 | Discharge: 2018-09-28 | Disposition: A | Discharge status: Home / Self Care | Payer: 59 | Source: Ambulatory Visit | Attending: Cardiology | Admitting: Cardiology

## 2018-09-28 ENCOUNTER — Other Ambulatory Visit: Payer: Self-pay

## 2018-09-28 DIAGNOSIS — I700 Atherosclerosis of aorta: Secondary | ICD-10-CM

## 2018-09-28 DIAGNOSIS — Z79899 Other long term (current) drug therapy: Secondary | ICD-10-CM

## 2018-09-28 DIAGNOSIS — R079 Chest pain, unspecified: Secondary | ICD-10-CM | POA: Diagnosis not present

## 2018-09-28 DIAGNOSIS — E785 Hyperlipidemia, unspecified: Secondary | ICD-10-CM

## 2018-09-28 MED ORDER — NITROGLYCERIN 0.4 MG SL SUBL
0.8000 mg | SUBLINGUAL_TABLET | Freq: Once | SUBLINGUAL | Status: AC
  Administered 2018-09-28: 0.8 mg via SUBLINGUAL

## 2018-09-28 MED ORDER — IOHEXOL 350 MG/ML SOLN
75.0000 mL | Freq: Once | INTRAVENOUS | Status: AC | PRN
  Administered 2018-09-28: 75 mL via INTRAVENOUS

## 2018-09-28 MED ORDER — ATORVASTATIN CALCIUM 20 MG PO TABS: 20.0000 mg | ORAL_TABLET | Freq: Every day | ORAL | 3 refills | Status: DC

## 2018-09-28 MED ORDER — ASPIRIN EC 81 MG PO TBEC: 81.0000 mg | DELAYED_RELEASE_TABLET | Freq: Every day | ORAL | 3 refills | Status: DC

## 2018-09-28 NOTE — Telephone Encounter (Signed)
  Calling with CT Chest results

## 2018-09-28 NOTE — Progress Notes (Signed)
Patient tolerated CT without incident. Drank a coke and ambulated to exit steady gait.  

## 2018-09-28 NOTE — Telephone Encounter (Signed)
Call received from [facility] called to report patient with Calcium score of 242 which places her in the 92nd percentile for age and sex.   It looks like Dr. Radford Pax has already reviewed this result and the patient has been made aware by other RN. FFR results pending.

## 2018-09-28 NOTE — Telephone Encounter (Signed)
-----   Message from Sueanne Margarita, MD sent at 09/28/2018 12:15 PM EDT ----- Coronary CTA showed increased calcium in coronary arteries with mild non obstructive plaque.  FFR has been sent and pending.  LDL goal < 70 and not at goal.  Start Lipitor 20mg  daily and repeat FLP and ALT in 6 weeks.  Start ASA 81mg  daily. Followup with me in 1 year

## 2018-09-28 NOTE — Telephone Encounter (Signed)
Notes recorded by Frederik Schmidt, RN on 09/28/2018 at 12:28 PM EDT  The patient has been notified of the result and verbalized understanding. All questions (if any) were answered.  Frederik Schmidt, RN 09/28/2018 12:28 PM

## 2018-09-30 ENCOUNTER — Telehealth: Payer: Self-pay

## 2018-09-30 ENCOUNTER — Ambulatory Visit (HOSPITAL_COMMUNITY): Payer: 59

## 2018-09-30 NOTE — Telephone Encounter (Signed)
I spoke to the patient with Dr Theodosia Blender recommendation.  The patient verbalized understanding.

## 2018-09-30 NOTE — Telephone Encounter (Signed)
lpmtcb 9/11 

## 2018-10-04 ENCOUNTER — Inpatient Hospital Stay: Admission: RE | Admit: 2018-10-04 | Payer: 59 | Source: Ambulatory Visit

## 2018-10-05 ENCOUNTER — Ambulatory Visit
Admission: RE | Admit: 2018-10-05 | Discharge: 2018-10-05 | Disposition: A | Discharge status: Home / Self Care | Payer: 59 | Source: Ambulatory Visit | Attending: Family Medicine | Admitting: Family Medicine

## 2018-10-05 DIAGNOSIS — Z1231 Encounter for screening mammogram for malignant neoplasm of breast: Secondary | ICD-10-CM | POA: Diagnosis not present

## 2018-10-11 ENCOUNTER — Other Ambulatory Visit: Payer: Self-pay | Admitting: Cardiology

## 2018-10-11 DIAGNOSIS — R079 Chest pain, unspecified: Secondary | ICD-10-CM

## 2018-10-17 ENCOUNTER — Other Ambulatory Visit: Payer: Self-pay

## 2018-10-17 DIAGNOSIS — Z20822 Contact with and (suspected) exposure to covid-19: Secondary | ICD-10-CM

## 2018-10-18 LAB — NOVEL CORONAVIRUS, NAA: SARS-CoV-2, NAA: NOT DETECTED

## 2018-10-20 ENCOUNTER — Other Ambulatory Visit: Payer: 59

## 2018-10-25 ENCOUNTER — Ambulatory Visit (HOSPITAL_COMMUNITY): Payer: 59 | Attending: Cardiology

## 2018-10-25 ENCOUNTER — Other Ambulatory Visit: Payer: Self-pay

## 2018-10-25 DIAGNOSIS — R079 Chest pain, unspecified: Secondary | ICD-10-CM | POA: Insufficient documentation

## 2018-10-26 ENCOUNTER — Telehealth: Payer: Self-pay | Admitting: Internal Medicine

## 2018-10-26 ENCOUNTER — Telehealth: Payer: Self-pay | Admitting: *Deleted

## 2018-10-26 ENCOUNTER — Other Ambulatory Visit: Payer: Self-pay

## 2018-10-26 ENCOUNTER — Encounter: Payer: Self-pay | Admitting: Cardiology

## 2018-10-26 ENCOUNTER — Telehealth: Payer: Self-pay

## 2018-10-26 DIAGNOSIS — G4733 Obstructive sleep apnea (adult) (pediatric): Secondary | ICD-10-CM

## 2018-10-26 DIAGNOSIS — I272 Pulmonary hypertension, unspecified: Secondary | ICD-10-CM

## 2018-10-26 DIAGNOSIS — Z01812 Encounter for preprocedural laboratory examination: Secondary | ICD-10-CM

## 2018-10-26 NOTE — Telephone Encounter (Signed)
-----   Message from Sueanne Margarita, MD sent at 10/26/2018 12:28 AM EDT ----- Echo showed normal LVF with mild to moderate TR and moderate pulmonary HTN.  Pulmonary HTN likely related to OSA.  Please find out if she is still using CPAP.  Please get a Chest CT to rule out PE and PFTs with DLCO for pulmonary HTN.  Repeat echo in 1 year for followup of pulmonary HTN

## 2018-10-26 NOTE — Telephone Encounter (Signed)
Notes recorded by Frederik Schmidt, RN on 10/26/2018 at 8:56 AM EDT  The patient has been notified of the Echo result and verbalized understanding. All questions (if any) were answered.  Frederik Schmidt, RN 10/26/2018 8:56 AM   The patient is getting married and would like to have these tests done in early November, if possible.  She just has too much going on right now for planning the wedding.  The patient said that she never used a CPAP or was told she had OSA.

## 2018-10-26 NOTE — Telephone Encounter (Signed)
She has a dx of OSA by her PCP.  I do want her to have a home sleep study to rule out OSA

## 2018-10-26 NOTE — Telephone Encounter (Signed)
I spoke with the patient and informed her that Dr Radford Pax said that November is ok for these tests.  She verbalized understanding.

## 2018-10-26 NOTE — Telephone Encounter (Signed)
Staff message sent to Jamie Garza ok to schedule HST. Patient has [employer]. No PA is required. 

## 2018-10-26 NOTE — Addendum Note (Signed)
Addended by: Frederik Schmidt on: 10/26/2018 10:44 AM   Modules accepted: Orders

## 2018-10-26 NOTE — Telephone Encounter (Signed)
lpmtcb 10/7 

## 2018-10-26 NOTE — Telephone Encounter (Signed)
-----   Message from Frederik Schmidt, RN sent at 10/26/2018 10:34 AM EDT ----- Regarding: Home Sleep Study Please schedule Home Sleep Study per Dr Radford Pax to rule out OSA  Patient getting married, please schedule in early November per Dr Radford Pax  Thank you

## 2018-10-27 NOTE — Telephone Encounter (Signed)
COVID Test sched for 11/17 @ 3:15 ordered by Dr. Fransico Him.  Pt aware.  Nothing further needed at this time.

## 2018-11-01 ENCOUNTER — Ambulatory Visit (INDEPENDENT_AMBULATORY_CARE_PROVIDER_SITE_OTHER): Payer: 59 | Admitting: *Deleted

## 2018-11-01 DIAGNOSIS — Z23 Encounter for immunization: Secondary | ICD-10-CM | POA: Diagnosis not present

## 2018-11-04 ENCOUNTER — Other Ambulatory Visit: Payer: 59 | Admitting: *Deleted

## 2018-11-04 ENCOUNTER — Other Ambulatory Visit: Payer: Self-pay

## 2018-11-04 DIAGNOSIS — E785 Hyperlipidemia, unspecified: Secondary | ICD-10-CM

## 2018-11-04 DIAGNOSIS — Z79899 Other long term (current) drug therapy: Secondary | ICD-10-CM

## 2018-11-04 LAB — LIPID PANEL
Chol/HDL Ratio: 2.2 ratio (ref 0.0–4.4)
Cholesterol, Total: 138 mg/dL (ref 100–199)
HDL: 63 mg/dL (ref >39)
LDL Chol Calc (NIH): 62 mg/dL (ref 0–99)
Triglycerides: 61 mg/dL (ref 0–149)
VLDL Cholesterol Cal: 13 mg/dL (ref 5–40)

## 2018-11-04 LAB — ALT: ALT: 21 IU/L (ref 0–32)

## 2018-11-07 ENCOUNTER — Telehealth: Payer: Self-pay | Admitting: *Deleted

## 2018-11-07 NOTE — Telephone Encounter (Signed)
Patient is aware and agreeable to Home Sleep Study through [facility]. Patient is scheduled for 12/23/18 at 12 p to pick up home sleep kit and meet with Respiratory therapist at [facility]. Patient is aware that if this appointment date and time does not work for them they should contact [facility] directly at [phone]. Patient is aware that a sleep packet will be sent from [facility] in week. Patient is agreeable to treatment and thankful for call.  

## 2018-11-07 NOTE — Telephone Encounter (Signed)
-----   Message from Lauralee Evener, [place] sent at 10/26/2018 12:12 PM EDT ----- Regarding: RE: Home Sleep Study Patient has [employer]. No PA is required. Ok to schedule. ----- Message ----- From: Frederik Schmidt, RN Sent: 10/26/2018  10:34 AM EDT To: Freada Bergeron, CMA, [facility] Sleep Studies Subject: Home Sleep Study                               Please schedule Home Sleep Study per Dr Radford Pax to rule out OSA  Patient getting married, please schedule in early November per Dr Radford Pax  Thank you

## 2018-11-22 ENCOUNTER — Other Ambulatory Visit: Payer: 59

## 2018-11-22 ENCOUNTER — Other Ambulatory Visit: Payer: Self-pay

## 2018-11-22 DIAGNOSIS — Z01812 Encounter for preprocedural laboratory examination: Secondary | ICD-10-CM

## 2018-11-22 DIAGNOSIS — I272 Pulmonary hypertension, unspecified: Secondary | ICD-10-CM

## 2018-11-22 DIAGNOSIS — R0602 Shortness of breath: Secondary | ICD-10-CM

## 2018-11-22 LAB — BASIC METABOLIC PANEL
BUN/Creatinine Ratio: 21 (ref 12–28)
BUN: 14 mg/dL (ref 8–27)
CO2: 29 mmol/L (ref 20–29)
Calcium: 10.3 mg/dL (ref 8.7–10.3)
Chloride: 105 mmol/L (ref 96–106)
Creatinine, Ser: 0.67 mg/dL (ref 0.57–1.00)
GFR calc Af Amer: 107 mL/min/{1.73_m2} (ref >59)
GFR calc non Af Amer: 93 mL/min/{1.73_m2} (ref >59)
Glucose: 95 mg/dL (ref 65–99)
Potassium: 4 mmol/L (ref 3.5–5.2)
Sodium: 141 mmol/L (ref 134–144)

## 2018-11-22 NOTE — Progress Notes (Signed)
Patient needs BMET stat due to CT scheduled today to rule out PE.

## 2018-11-23 ENCOUNTER — Ambulatory Visit (INDEPENDENT_AMBULATORY_CARE_PROVIDER_SITE_OTHER)
Admission: RE | Admit: 2018-11-23 | Discharge: 2018-11-23 | Disposition: A | Discharge status: Home / Self Care | Payer: 59 | Source: Ambulatory Visit | Attending: Cardiology | Admitting: Cardiology

## 2018-11-23 DIAGNOSIS — I272 Pulmonary hypertension, unspecified: Secondary | ICD-10-CM

## 2018-11-23 DIAGNOSIS — G4733 Obstructive sleep apnea (adult) (pediatric): Secondary | ICD-10-CM

## 2018-11-23 MED ORDER — IOHEXOL 350 MG/ML SOLN
80.0000 mL | Freq: Once | INTRAVENOUS | Status: AC | PRN
  Administered 2018-11-23: 80 mL via INTRAVENOUS

## 2018-12-02 ENCOUNTER — Telehealth: Payer: Self-pay | Admitting: *Deleted

## 2018-12-02 NOTE — Telephone Encounter (Signed)
   [facility] Pre-operative Risk Assessment    Request for surgical clearance:  1. What type of surgery is being performed? RIGHT CARPAL TUNNEL RELEASE   2. When is this surgery scheduled? TBD   3. What type of clearance is required (medical clearance vs. Pharmacy clearance to hold med vs. Both)? MEDICAL  4. Are there any medications that need to be held prior to surgery and how long? ASA   5. Practice name and name of physician performing surgery? [facility]; DR. Christia Reading [facility]   6. What is your office phone number [phone] EXT [phone]    7.   What is your office fax number [phone]  8.   Anesthesia type (None, local, MAC, general) ? CHOICE   Julaine Hua 12/02/2018, 1:41 PM  _________________________________________________________________   (provider comments below)

## 2018-12-02 NOTE — Telephone Encounter (Signed)
   Primary Cardiologist: Fransico Him, MD  Chart reviewed as part of pre-operative protocol coverage. Patient was contacted 12/02/2018 in reference to pre-operative risk assessment for pending surgery as outlined below.  Jamie Garza was last seen on 09/2018 via telemedicine by Dr. Radford Pax for family history of CAD as well as OSA, HTN, HLD, pulmonary nodule. 2D echo 10/25/18 showed normal EF, normal RV function, mild-moderate TR, moderate pulmonary HTN (felt likely due to OSA). 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 Revised cardiac risk index is 0.4% indicating very low risk of CV complications. I spoke with the patient who affirms no new concerning cardiac symptoms. Therefore, based on ACC/AHA guidelines, the patient would be at acceptable risk for the planned procedure without further cardiovascular testing. She can hold aspirin as requested for the procedure at discretion of surgeon given no history of MI, PCI or stroke.  I will route this recommendation to the requesting party via Epic fax function and remove from pre-op pool.  Please call with questions.  Charlie Pitter, PA-C 12/02/2018, 2:53 PM

## 2018-12-06 ENCOUNTER — Other Ambulatory Visit (HOSPITAL_COMMUNITY)
Admission: RE | Admit: 2018-12-06 | Discharge: 2018-12-06 | Disposition: A | Discharge status: Home / Self Care | Payer: 59 | Source: Ambulatory Visit | Attending: Cardiology | Admitting: Cardiology

## 2018-12-06 DIAGNOSIS — Z20828 Contact with and (suspected) exposure to other viral communicable diseases: Secondary | ICD-10-CM | POA: Insufficient documentation

## 2018-12-06 DIAGNOSIS — Z01812 Encounter for preprocedural laboratory examination: Secondary | ICD-10-CM | POA: Diagnosis present

## 2018-12-07 LAB — NOVEL CORONAVIRUS, NAA (HOSP ORDER, SEND-OUT TO REF LAB; TAT 18-24 HRS): SARS-CoV-2, NAA: NOT DETECTED

## 2018-12-09 ENCOUNTER — Ambulatory Visit (INDEPENDENT_AMBULATORY_CARE_PROVIDER_SITE_OTHER): Payer: 59 | Admitting: Internal Medicine

## 2018-12-09 ENCOUNTER — Other Ambulatory Visit: Payer: Self-pay

## 2018-12-09 DIAGNOSIS — I272 Pulmonary hypertension, unspecified: Secondary | ICD-10-CM | POA: Diagnosis not present

## 2018-12-09 DIAGNOSIS — G4733 Obstructive sleep apnea (adult) (pediatric): Secondary | ICD-10-CM

## 2018-12-09 LAB — PULMONARY FUNCTION TEST
DL/VA % pred: 98 %
DL/VA: 4.2 ml/min/mmHg/L
DLCO unc % pred: 95 %
DLCO unc: 17.51 ml/min/mmHg
FEF 25-75 Post: 2.87 L/sec
FEF 25-75 Pre: 1.6 L/sec
FEF2575-%Change-Post: 79 %
FEF2575-%Pred-Post: 141 %
FEF2575-%Pred-Pre: 78 %
FEV1-%Change-Post: 9 %
FEV1-%Pred-Post: 98 %
FEV1-%Pred-Pre: 89 %
FEV1-Post: 2.17 L
FEV1-Pre: 1.98 L
FEV1FVC-%Change-Post: 8 %
FEV1FVC-%Pred-Pre: 102 %
FEV6-%Change-Post: 1 %
FEV6-%Pred-Post: 90 %
FEV6-%Pred-Pre: 89 %
FEV6-Post: 2.52 L
FEV6-Pre: 2.49 L
FEV6FVC-%Pred-Post: 104 %
FEV6FVC-%Pred-Pre: 104 %
FVC-%Change-Post: 1 %
FVC-%Pred-Post: 87 %
FVC-%Pred-Pre: 86 %
FVC-Post: 2.52 L
FVC-Pre: 2.49 L
Post FEV1/FVC ratio: 86 %
Post FEV6/FVC ratio: 100 %
Pre FEV1/FVC ratio: 79 %
Pre FEV6/FVC Ratio: 100 %
RV % pred: 117 %
RV: 2.28 L
TLC % pred: 101 %
TLC: 4.76 L

## 2018-12-09 NOTE — Progress Notes (Signed)
Full PFT performed today. °

## 2018-12-12 ENCOUNTER — Telehealth: Payer: Self-pay

## 2018-12-12 NOTE — Telephone Encounter (Signed)
Notes recorded by Frederik Schmidt, RN on 12/12/2018 at 9:46 AM EST  The patient has been notified of the result and verbalized understanding. All questions (if any) were answered.  Frederik Schmidt, RN 12/12/2018 9:46 AM

## 2018-12-12 NOTE — Telephone Encounter (Signed)
-----   Message from Sueanne Margarita, MD sent at 12/10/2018  8:18 PM EST ----- Please let patient know that PFTs showed minimal COPD but otherwise normal.  Pulmonary HTN likely related to OSA

## 2018-12-20 ENCOUNTER — Telehealth: Payer: Self-pay | Admitting: Internal Medicine

## 2018-12-20 HISTORY — PX: CARPAL TUNNEL RELEASE: SHX101

## 2018-12-20 NOTE — Telephone Encounter (Signed)
Dr. Annamaria Boots. I have let Horris Latino know to keep a look out for this if it hasnt already arrived. I am sending this to you as an FYI since she doesn't have access to getting messages yet.

## 2018-12-20 NOTE — Telephone Encounter (Signed)
I have signed this and have it at [facility], ready to fax

## 2018-12-20 NOTE — Telephone Encounter (Signed)
Form was faxed and placed in scan folder  I called and spoke with the pt and notified that this was done Nothing further needed

## 2018-12-23 ENCOUNTER — Ambulatory Visit (HOSPITAL_BASED_OUTPATIENT_CLINIC_OR_DEPARTMENT_OTHER): Payer: 59

## 2018-12-23 ENCOUNTER — Other Ambulatory Visit: Payer: Self-pay

## 2019-01-04 ENCOUNTER — Telehealth: Payer: Self-pay

## 2019-01-04 DIAGNOSIS — Z8619 Personal history of other infectious and parasitic diseases: Secondary | ICD-10-CM | POA: Insufficient documentation

## 2019-01-04 MED ORDER — VALACYCLOVIR HCL 1 G PO TABS: ORAL_TABLET | ORAL | 3 refills | Status: DC

## 2019-01-04 NOTE — Telephone Encounter (Signed)
Appointment 12/17   [address] [place] [phone]

## 2019-01-04 NOTE — Telephone Encounter (Signed)
Pt left v/m that the last 2 months have been extremely stressful and now has fever blisters. Pt request med for fever blisters to new pharmacy (no name). I left v/m for pt to cb to schedule virtual appt with Dr Glori Bickers and to leave info for new pharmacy.

## 2019-01-04 NOTE — Telephone Encounter (Signed)
Pt notified Rx sent and advised of Dr. Marliss Coots comments. Pt cancelled appt for tomorrow since PCP sent meds. She will f/u if it doesn't help

## 2019-01-04 NOTE — Telephone Encounter (Signed)
I sent in valtrex to take 2 pills twice daily for one day for cold sores with some refills  Let me know if this does not help It works better if taken early in the course of a cold sore

## 2019-01-05 ENCOUNTER — Ambulatory Visit: Payer: 59 | Admitting: Family Medicine

## 2019-01-10 NOTE — Progress Notes (Unsigned)
This encounter was created in error - please disregard.

## 2019-01-11 ENCOUNTER — Ambulatory Visit: Payer: 59 | Attending: Internal Medicine

## 2019-01-11 ENCOUNTER — Other Ambulatory Visit: Payer: Self-pay

## 2019-01-11 DIAGNOSIS — Z20822 Contact with and (suspected) exposure to covid-19: Secondary | ICD-10-CM

## 2019-01-12 LAB — NOVEL CORONAVIRUS, NAA: SARS-CoV-2, NAA: NOT DETECTED

## 2019-06-01 ENCOUNTER — Telehealth: Payer: Self-pay | Admitting: *Deleted

## 2019-06-01 DIAGNOSIS — R911 Solitary pulmonary nodule: Secondary | ICD-10-CM | POA: Insufficient documentation

## 2019-06-01 NOTE — Telephone Encounter (Signed)
-----   Message from Judy Pimple, MD sent at 05/31/2019  8:48 PM EDT ----- She is due for her CT of the chest (6-12 mo re check) to follow pulmonary nodule  Let me know if she would like me to go ahead and refer for that  ----- Message ----- From: Judy Pimple, MD Sent: 05/31/2019 To: Judy Pimple, MD  Needs re check CT 6-12 mo

## 2019-06-01 NOTE — Telephone Encounter (Signed)
Pt agrees with CT, pt did move so she needs CT scan done in [place] (not ), please put order in and I advised pt our [facility] will call to schedule appt

## 2019-06-02 NOTE — Telephone Encounter (Signed)
Ct scheduled and patient aware. °

## 2019-06-13 ENCOUNTER — Ambulatory Visit (INDEPENDENT_AMBULATORY_CARE_PROVIDER_SITE_OTHER)
Admission: RE | Admit: 2019-06-13 | Discharge: 2019-06-13 | Disposition: A | Discharge status: Home / Self Care | Payer: 59 | Source: Ambulatory Visit | Attending: Family Medicine | Admitting: Family Medicine

## 2019-06-13 ENCOUNTER — Other Ambulatory Visit: Payer: Self-pay

## 2019-06-13 DIAGNOSIS — R911 Solitary pulmonary nodule: Secondary | ICD-10-CM

## 2019-08-02 ENCOUNTER — Ambulatory Visit: Payer: 59 | Admitting: Family Medicine

## 2019-08-02 ENCOUNTER — Other Ambulatory Visit: Payer: Self-pay

## 2019-08-02 ENCOUNTER — Encounter: Payer: Self-pay | Admitting: Family Medicine

## 2019-08-02 VITALS — BP 124/68 | HR 58 | Temp 97.6°F | Ht 61.5 in | Wt 146.2 lb

## 2019-08-02 DIAGNOSIS — H9201 Otalgia, right ear: Secondary | ICD-10-CM | POA: Diagnosis not present

## 2019-08-02 DIAGNOSIS — H6120 Impacted cerumen, unspecified ear: Secondary | ICD-10-CM | POA: Diagnosis not present

## 2019-08-02 MED ORDER — FLUTICASONE PROPIONATE 50 MCG/ACT NA SUSP: 2.0000 | Freq: Every day | NASAL | 6 refills | Status: DC

## 2019-08-02 NOTE — Assessment & Plan Note (Signed)
Mild-bilateral Doubt causing pain  Adv to use debrox or similar product otc to help loosen and encourage to come out Can also consider irrigation in future if needed

## 2019-08-02 NOTE — Progress Notes (Signed)
Subjective:    Patient ID: Jamie Garza, female    DOB: Dec 05, 1955, 64 y.o.   MRN: [id number]  This visit occurred during the SARS-CoV-2 public health emergency.  Safety protocols were in place, including screening questions prior to the visit, additional usage of staff PPE, and extensive cleaning of exam room while observing appropriate contact time as indicated for disinfecting solutions.    HPI Pt presents with R ear pain   Started hurting 2 wk ago  Then became on/off [place] in ear as well- would stop when she swallowed  Then hurt under ear- ache  Aleve helps  occ feels stopped up   Has some baseline hearing loss in both ears  Not a lot of allergy/sinus congestion  occ sneezing   Patient Active Problem List   Diagnosis Date Noted  . Right ear pain 08/02/2019  . Cerumen in auditory canal on examination 08/02/2019  . Pulmonary nodule 06/01/2019  . 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 Family history of heart disease 08/05/2018  . Stress reaction 07/18/2018  . Essential hypertension 06/14/2018  . Spinal stenosis 09/02/2016  . Hyperlipidemia 09/02/2016  . History of radius fracture 05/23/2011  . Encounter for routine gynecological examination 12/19/2010  . Other screening mammogram 12/19/2010  . Routine general medical examination at a health care facility 12/15/2010  . Insomnia 11/20/2008  . SLEEP APNEA 01/04/2007   Past Medical History:  Diagnosis Date  . Depression with anxiety 10/30/2011  . Distal radius fracture, left 05/23/2011  . Essential hypertension 06/14/2018  . Family history of heart disease 08/05/2018  . History of radius fracture 05/23/2011   Of note- nl dexa 2013-not a fragility fracture   . Hyperlipidemia 09/02/2016  . Insomnia   . Pulmonary hypertension (HCC)    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 10/2018  . Sleep apnea   . SLEEP APNEA 01/04/2007   Qualifier: Diagnosis of  By: Jillyn Hidden FNP, Mcarthur Rossetti   . Spinal stenosis 09/02/2016   lumbar    . Spinal stenosis, lumbar    L4 and L5 - severe; L3 and L4 - moderate  . Stress reaction 07/18/2018   Past Surgical History:  Procedure Laterality Date  . WRIST FRACTURE SURGERY     Social History   Tobacco Use  . Smoking status: Never Smoker  . Smokeless tobacco: Never Used  Substance Use Topics  . Alcohol use: Yes    Alcohol/week: 0.0 standard drinks    Comment: occasional  . Drug use: No   Family History  Problem Relation Age of Onset  . Diabetes Mother        borderline DM  . Aortic stenosis Mother   . Diabetes Maternal Aunt   . Diabetes Maternal Grandfather   . Diabetes Paternal Grandfather   . CAD Brother   . Aortic stenosis Brother   . Breast cancer Neg Hx    No Known Allergies Current Outpatient Medications on File Prior to Visit  Medication Sig Dispense Refill  . aspirin EC 81 MG tablet Take 1 tablet (81 mg total) by mouth daily. 90 tablet 3  . atorvastatin (LIPITOR) 20 MG tablet Take 1 tablet (20 mg total) by mouth daily. 90 tablet 3  . losartan (COZAAR) 100 MG tablet Take 1 tablet (100 mg total) by mouth daily. 90 tablet 3  . LYSINE HCL PO Take 1 tablet by mouth daily.    . naproxen sodium (ALEVE) 220 MG tablet Take 220 mg by mouth at bedtime  as needed.     . Probiotic Product (PROBIOTIC DAILY PO) Take 1 capsule by mouth 2 (two) times a week.     . traZODone (DESYREL) 50 MG tablet Take 1 tablet (50 mg total) by mouth at bedtime. 90 tablet 3  . valACYclovir (VALTREX) 1000 MG tablet Take 2 pills by mouth every 12 hours for one day for fever blister/cold sore 4 tablet 3   No current facility-administered medications on file prior to visit.    Review of Systems  Constitutional: Negative for activity change, appetite change, fatigue, fever and unexpected weight change.  HENT: Positive for ear pain, postnasal drip and rhinorrhea. Negative for congestion, dental problem, ear discharge, facial swelling, hearing loss, sinus pressure, sinus pain, sore throat and voice  change.   Eyes: Negative for pain, redness and visual disturbance.  Respiratory: Negative for cough, shortness of breath and wheezing.   Cardiovascular: Negative for chest pain and palpitations.  Gastrointestinal: Negative for abdominal pain, blood in stool, constipation and diarrhea.  Endocrine: Negative for polydipsia and polyuria.  Genitourinary: Negative for dysuria, frequency and urgency.  Musculoskeletal: Negative for arthralgias, back pain and myalgias.  Skin: Negative for pallor and rash.  Allergic/Immunologic: Negative for environmental allergies.  Neurological: Negative for dizziness, syncope and headaches.  Hematological: Negative for adenopathy. Does not bruise/bleed easily.  Psychiatric/Behavioral: Negative for decreased concentration and dysphoric mood. The patient is not nervous/anxious.        Job is very stressful Also lost both parents this year       Objective:   Physical Exam Constitutional:      General: She is not in acute distress.    Appearance: Normal appearance. She is normal weight. She is not ill-appearing or diaphoretic.  HENT:     Head: Normocephalic and atraumatic.     Comments: No sinus tenderness or facial swelling    Right Ear: External ear normal.     Left Ear: External ear normal.     Ears:     Comments: Small amt of cerumen in canal bilat TMs appear clear but slt dull No bulging  No visible effusion and no erythema  Hearing grossly symmetric to soft sound    Nose:     Comments: Mild congestion     Mouth/Throat:     Mouth: Mucous membranes are moist.     Pharynx: Oropharynx is clear. No posterior oropharyngeal erythema.  Eyes:     General:        Right eye: No discharge.        Left eye: No discharge.     Extraocular Movements: Extraocular movements intact.     Conjunctiva/sclera: Conjunctivae normal.     Pupils: Pupils are equal, round, and reactive to light.  Cardiovascular:     Rate and Rhythm: Normal rate and regular rhythm.      Heart sounds: Normal heart sounds.  Pulmonary:     Effort: Pulmonary effort is normal. No respiratory distress.     Breath sounds: Normal breath sounds. No wheezing.  Musculoskeletal:     Cervical back: Normal range of motion. No tenderness.  Lymphadenopathy:     Cervical: Cervical adenopathy present.  Skin:    Coloration: Skin is not pale.     Findings: No erythema or rash.  Neurological:     Mental Status: She is alert.     Cranial Nerves: No cranial nerve deficit.     Coordination: Coordination normal.     Deep Tendon Reflexes: Reflexes normal.  Psychiatric:        Mood and Affect: Mood normal.           Assessment & Plan:   Problem List Items Addressed This Visit      Other   Right ear pain - Primary    Most likely from ETD (some ear wax notes as well) Px fluticasone ns to use daily for at least 2 wk Update if worse or no imp  Update if change in hearing   She can use debrox for ear wax to keep it from increasing  Hearing is symmetric today      Cerumen in auditory canal on examination    Mild-bilateral Doubt causing pain  Adv to use debrox or similar product otc to help loosen and encourage to come out Can also consider irrigation in future if needed

## 2019-08-02 NOTE — Patient Instructions (Addendum)
I think you have Eustachian tube dysfunction in the R ear causing your symptoms Also a little wax (not a lot)   Use flonase nasal spray as directed daily for at least 2-3 weeks -then as needed   Altitude change may make this worse - always be prepared   For wax-get debrox otic solution (or store brand) and use every other day both ears until you get some wax out   If pain worsens- let us know /you could get an infection

## 2019-08-02 NOTE — Assessment & Plan Note (Signed)
Most likely from ETD (some ear wax notes as well) Px fluticasone ns to use daily for at least 2 wk Update if worse or no imp  Update if change in hearing   She can use debrox for ear wax to keep it from increasing  Hearing is symmetric today

## 2019-08-04 ENCOUNTER — Other Ambulatory Visit: Payer: Self-pay | Admitting: Family Medicine

## 2019-09-11 ENCOUNTER — Ambulatory Visit (INDEPENDENT_AMBULATORY_CARE_PROVIDER_SITE_OTHER): Payer: 59 | Admitting: Family Medicine

## 2019-09-11 ENCOUNTER — Encounter: Payer: 59 | Admitting: Family Medicine

## 2019-09-11 ENCOUNTER — Encounter: Payer: Self-pay | Admitting: Family Medicine

## 2019-09-11 ENCOUNTER — Other Ambulatory Visit (HOSPITAL_COMMUNITY)
Admission: RE | Admit: 2019-09-11 | Discharge: 2019-09-11 | Disposition: A | Discharge status: Home / Self Care | Payer: 59 | Source: Ambulatory Visit | Attending: Family Medicine | Admitting: Family Medicine

## 2019-09-11 ENCOUNTER — Other Ambulatory Visit: Payer: Self-pay

## 2019-09-11 VITALS — BP 116/78 | HR 56 | Temp 97.4°F | Ht 61.5 in | Wt 146.5 lb

## 2019-09-11 DIAGNOSIS — I10 Essential (primary) hypertension: Secondary | ICD-10-CM

## 2019-09-11 DIAGNOSIS — Z01419 Encounter for gynecological examination (general) (routine) without abnormal findings: Secondary | ICD-10-CM

## 2019-09-11 DIAGNOSIS — E7800 Pure hypercholesterolemia, unspecified: Secondary | ICD-10-CM | POA: Diagnosis not present

## 2019-09-11 DIAGNOSIS — Z1231 Encounter for screening mammogram for malignant neoplasm of breast: Secondary | ICD-10-CM

## 2019-09-11 DIAGNOSIS — Z0000 Encounter for general adult medical examination without abnormal findings: Secondary | ICD-10-CM

## 2019-09-11 DIAGNOSIS — F5104 Psychophysiologic insomnia: Secondary | ICD-10-CM

## 2019-09-11 LAB — COMPREHENSIVE METABOLIC PANEL
ALT: 20 U/L (ref 0–35)
AST: 22 U/L (ref 0–37)
Albumin: 4.2 g/dL (ref 3.5–5.2)
Alkaline Phosphatase: 65 U/L (ref 39–117)
BUN: 14 mg/dL (ref 6–23)
CO2: 28 mEq/L (ref 19–32)
Calcium: 9.4 mg/dL (ref 8.4–10.5)
Chloride: 105 mEq/L (ref 96–112)
Creatinine, Ser: 0.65 mg/dL (ref 0.40–1.20)
GFR: 91.5 mL/min (ref >60.00)
Glucose, Bld: 87 mg/dL (ref 70–99)
Potassium: 4.1 mEq/L (ref 3.5–5.1)
Sodium: 140 mEq/L (ref 135–145)
Total Bilirubin: 0.7 mg/dL (ref 0.2–1.2)
Total Protein: 7.2 g/dL (ref 6.0–8.3)

## 2019-09-11 LAB — CBC WITH DIFFERENTIAL/PLATELET
Basophils Absolute: 0 10*3/uL (ref 0.0–0.1)
Basophils Relative: 0.5 % (ref 0.0–3.0)
Eosinophils Absolute: 0.1 10*3/uL (ref 0.0–0.7)
Eosinophils Relative: 1.7 % (ref 0.0–5.0)
HCT: 35.7 % — ABNORMAL LOW (ref 36.0–46.0)
Hemoglobin: 11.8 g/dL — ABNORMAL LOW (ref 12.0–15.0)
Lymphocytes Relative: 50.9 % — ABNORMAL HIGH (ref 12.0–46.0)
Lymphs Abs: 3 10*3/uL (ref 0.7–4.0)
MCHC: 32.9 g/dL (ref 30.0–36.0)
MCV: 90.3 fl (ref 78.0–100.0)
Monocytes Absolute: 0.6 10*3/uL (ref 0.1–1.0)
Monocytes Relative: 10.3 % (ref 3.0–12.0)
Neutro Abs: 2.2 10*3/uL (ref 1.4–7.7)
Neutrophils Relative %: 36.6 % — ABNORMAL LOW (ref 43.0–77.0)
Platelets: 250 10*3/uL (ref 150.0–400.0)
RBC: 3.96 Mil/uL (ref 3.87–5.11)
RDW: 13.7 % (ref 11.5–15.5)
WBC: 6 10*3/uL (ref 4.0–10.5)

## 2019-09-11 LAB — LIPID PANEL
Cholesterol: 148 mg/dL (ref 0–200)
HDL: 54.4 mg/dL (ref >39.00)
LDL Cholesterol: 67 mg/dL (ref 0–99)
NonHDL: 93.79
Total CHOL/HDL Ratio: 3
Triglycerides: 132 mg/dL (ref 0.0–149.0)
VLDL: 26.4 mg/dL (ref 0.0–40.0)

## 2019-09-11 LAB — TSH: TSH: 1.64 u[IU]/mL (ref 0.35–4.50)

## 2019-09-11 MED ORDER — LOSARTAN POTASSIUM 100 MG PO TABS: 100.0000 mg | ORAL_TABLET | Freq: Every day | ORAL | 3 refills | Status: DC

## 2019-09-11 MED ORDER — TRAZODONE HCL 50 MG PO TABS: 50.0000 mg | ORAL_TABLET | Freq: Every day | ORAL | 3 refills | Status: DC

## 2019-09-11 NOTE — Assessment & Plan Note (Signed)
Pt continues trazodone which helps

## 2019-09-11 NOTE — Assessment & Plan Note (Signed)
Reviewed health habits including diet and exercise and skin cancer prevention Reviewed appropriate screening tests for age  Also reviewed health mt list, fam hx and immunization status , as well as social and family history   See HPI Labs ordered  covid immunized  Planning flu shot in fhe fall  She will call us when ready to schedule a colonoscopy- likely fall or winter Gyn exam and pap today Pt will call to schedule her mammogram - given contact info  No falls or fractures-disc imp of ca and D for bone health (will consider dexa when on medicare)

## 2019-09-11 NOTE — Assessment & Plan Note (Signed)
bp in fair control at this time  BP Readings from Last 1 Encounters:  09/11/19 116/78   No changes needed Most recent labs reviewed  Disc lifstyle change with low sodium diet and exercise

## 2019-09-11 NOTE — Patient Instructions (Addendum)
Let us know when you are ready to schedule your colonoscopy   Mammogram will due next month- don't forget to schedule it   Try to get 1200-1500 mg of calcium per day with at least 1000 iu of vitamin D - for bone health  If you don't tolerate calcium just take the vit D   Pap and gyn exam done today   Keep taking good care of yourself   Labs today

## 2019-09-11 NOTE — Assessment & Plan Note (Signed)
Pt wants to change to the breast center for routine screening  Order done  Given number to call and schedule appt herself

## 2019-09-11 NOTE — Progress Notes (Signed)
Subjective:    Patient ID: Jamie Garza, female    DOB: [birth year], 64 y.o.   MRN: [id number]  This visit occurred during the SARS-CoV-2 public health emergency.  Safety protocols were in place, including screening questions prior to the visit, additional usage of staff PPE, and extensive cleaning of exam room while observing appropriate contact time as indicated for disinfecting solutions.    HPI Here for health maintenance exam and to review chronic medical problems    Wt Readings from Last 3 Encounters:  09/11/19 146 lb 8 oz (66.5 kg)  08/02/19 146 lb 3 oz (66.3 kg)  09/22/18 140 lb (63.5 kg)   27.23 kg/m  Doing ok overall  Gained some weight  A lot of stress at work  She may consider semi retirement in the future   Dealing with shoulder problems and knee problems Has MRI planned for shoulder  Has a bad family history of arthritis    covid immunized Flu shot -gets in the fall -plans on it  Tdap 10/20 shingrix- completed  Colonoscopy 8/11 Wants to put this off for the next few months    Pap 8/18 -neg with neg HPV Will do today  No new sexual partners   Mammogram 9/20  Self breast exam-no lumps   Falls -none Fractures-none  Needs ca and D  HTN bp is stable today  No cp or palpitations or headaches or edema  No side effects to medicines  BP Readings from Last 3 Encounters:  09/11/19 116/78  08/02/19 124/68  09/28/18 111/60      Hyperlipidemia Lab Results  Component Value Date   CHOL 138 11/04/2018   CHOL 179 09/06/2018   CHOL 183 08/27/2017   Lab Results  Component Value Date   HDL 63 11/04/2018   HDL 46.60 09/06/2018   HDL 60.70 08/27/2017   Lab Results  Component Value Date   LDLCALC 62 11/04/2018   LDLCALC 106 (H) 09/06/2018   LDLCALC 106 (H) 08/27/2017   Lab Results  Component Value Date   TRIG 61 11/04/2018   TRIG 130.0 09/06/2018   TRIG 80.0 08/27/2017   Lab Results  Component Value Date   CHOLHDL 2.2 11/04/2018   CHOLHDL  4 09/06/2018   CHOLHDL 3 08/27/2017   Lab Results  Component Value Date   LDLDIRECT 137.8 12/03/2009  taking 20 mg of atorvastatin from cardiology    Labs-due for today  Patient Active Problem List   Diagnosis Date Noted   Pulmonary nodule 06/01/2019   H/O cold sores 01/04/2019   Family history of heart disease 08/05/2018   Stress reaction 07/18/2018   Essential hypertension 06/14/2018   Spinal stenosis 09/02/2016   Hyperlipidemia 09/02/2016   History of radius fracture 05/23/2011   Encounter for routine gynecological examination 12/19/2010   Screening mammogram, encounter for 12/19/2010   Routine general medical examination at a health care facility 12/15/2010   Insomnia 11/20/2008   SLEEP APNEA 01/04/2007   Past Medical History:  Diagnosis Date   Depression with anxiety 10/30/2011   Distal radius fracture, left 05/23/2011   Essential hypertension 06/14/2018   Family history of heart disease 08/05/2018   History of radius fracture 05/23/2011   Of note- nl dexa 2013-not a fragility fracture    Hyperlipidemia 09/02/2016   Insomnia    Pulmonary hypertension (HCC)    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 10/2018   Sleep apnea    SLEEP APNEA 01/04/2007   Qualifier: Diagnosis of  By: Jillyn Hidden FNP, Mcarthur Rossetti    Spinal stenosis 09/02/2016   lumbar   Spinal stenosis, lumbar    L4 and L5 - severe; L3 and L4 - moderate   Stress reaction 07/18/2018   Past Surgical History:  Procedure Laterality Date   WRIST FRACTURE SURGERY     Social History   Tobacco Use   Smoking status: Never Smoker   Smokeless tobacco: Never Used  Substance Use Topics   Alcohol use: Yes    Alcohol/week: 0.0 standard drinks    Comment: occasional   Drug use: No   Family History  Problem Relation Age of Onset   Diabetes Mother        borderline DM   Aortic stenosis Mother    Diabetes Maternal Aunt    Diabetes Maternal Grandfather    Diabetes  Paternal Grandfather    CAD Brother    Aortic stenosis Brother    Breast cancer Neg Hx    No Known Allergies Current Outpatient Medications on File Prior to Visit  Medication Sig Dispense Refill   aspirin EC 81 MG tablet Take 1 tablet (81 mg total) by mouth daily. 90 tablet 3   atorvastatin (LIPITOR) 20 MG tablet Take 1 tablet (20 mg total) by mouth daily. 90 tablet 3   fluticasone (FLONASE) 50 MCG/ACT nasal spray Place 2 sprays into both nostrils daily. 16 g 6   LYSINE HCL PO Take 1 tablet by mouth daily.     naproxen sodium (ALEVE) 220 MG tablet Take 220 mg by mouth at bedtime as needed.      Probiotic Product (PROBIOTIC DAILY PO) Take 1 capsule by mouth 2 (two) times a week.      valACYclovir (VALTREX) 1000 MG tablet Take 2 pills by mouth every 12 hours for one day for fever blister/cold sore 4 tablet 3   No current facility-administered medications on file prior to visit.    Review of Systems  Constitutional: Positive for fatigue. Negative for activity change, appetite change, fever and unexpected weight change.  HENT: Negative for congestion, ear pain, rhinorrhea, sinus pressure and sore throat.   Eyes: Negative for pain, redness and visual disturbance.  Respiratory: Negative for cough, shortness of breath and wheezing.   Cardiovascular: Negative for chest pain and palpitations.  Gastrointestinal: Negative for abdominal pain, blood in stool, constipation and diarrhea.  Endocrine: Negative for polydipsia and polyuria.  Genitourinary: Negative for dysuria, frequency and urgency.  Musculoskeletal: Negative for arthralgias, back pain and myalgias.  Skin: Negative for pallor and rash.  Allergic/Immunologic: Negative for environmental allergies.  Neurological: Negative for dizziness, syncope and headaches.  Hematological: Negative for adenopathy. Does not bruise/bleed easily.  Psychiatric/Behavioral: Negative for decreased concentration and dysphoric mood. The patient is not  nervous/anxious.        Stressed due to high pressure job  Thinking about semi retirement/ cutting hours       Objective:   Physical Exam Constitutional:      General: She is not in acute distress.    Appearance: Normal appearance. She is well-developed and normal weight. She is not ill-appearing or diaphoretic.  HENT:     Head: Normocephalic and atraumatic.     Right Ear: Tympanic membrane, ear canal and external ear normal.     Left Ear: Tympanic membrane, ear canal and external ear normal.     Nose: Nose normal. No congestion.     Mouth/Throat:     Mouth: Mucous membranes are moist.  Pharynx: Oropharynx is clear. No posterior oropharyngeal erythema.  Eyes:     General: No scleral icterus.    Extraocular Movements: Extraocular movements intact.     Conjunctiva/sclera: Conjunctivae normal.     Pupils: Pupils are equal, round, and reactive to light.  Neck:     Thyroid: No thyromegaly.     Vascular: No carotid bruit or JVD.  Cardiovascular:     Rate and Rhythm: Normal rate and regular rhythm.     Pulses: Normal pulses.     Heart sounds: Normal heart sounds. No gallop.   Pulmonary:     Effort: Pulmonary effort is normal. No respiratory distress.     Breath sounds: Normal breath sounds. No wheezing.     Comments: Good air exch Chest:     Chest wall: No tenderness.  Abdominal:     General: Bowel sounds are normal. There is no distension or abdominal bruit.     Palpations: Abdomen is soft. There is no mass.     Tenderness: There is no abdominal tenderness.     Hernia: No hernia is present.  Genitourinary:    Comments: Breast exam: No mass, nodules, thickening, tenderness, bulging, retraction, inflamation, nipple discharge or skin changes noted.  No axillary or clavicular LA.              Anus appears normal w/o hemorrhoids or masses     External genitalia : nl appearance and hair distribution/no lesions     Urethral meatus : nl size, no lesions or prolapse      Urethra: no masses, tenderness or scarring    Bladder : no masses or tenderness     Vagina: nl general appearance, no discharge or  Lesions, no significant cystocele  or rectocele     Cervix: no lesions/ discharge or friability    Uterus: nl size, contour, position, and mobility (not fixed) , non tender    Adnexa : no masses, tenderness, enlargement or nodularity       Musculoskeletal:        General: No tenderness. Normal range of motion.     Cervical back: Normal range of motion and neck supple. No rigidity. No muscular tenderness.     Right lower leg: No edema.     Left lower leg: No edema.  Lymphadenopathy:     Cervical: No cervical adenopathy.  Skin:    General: Skin is warm and dry.     Coloration: Skin is not pale.     Findings: No erythema or rash.     Comments: Solar lentigines diffusely   Neurological:     Mental Status: She is alert. Mental status is at baseline.     Cranial Nerves: No cranial nerve deficit.     Motor: No abnormal muscle tone.     Coordination: Coordination normal.     Gait: Gait normal.     Deep Tendon Reflexes: Reflexes are normal and symmetric. Reflexes normal.  Psychiatric:        Mood and Affect: Mood normal.        Cognition and Memory: Cognition and memory normal.     Comments: Pleasant            Assessment & Plan:   Problem List Items Addressed This Visit      Cardiovascular and Mediastinum   Essential hypertension    bp in fair control at this time  BP Readings from Last 1 Encounters:  09/11/19 116/78   No changes needed Most  recent labs reviewed  Disc lifstyle change with low sodium diet and exercise        Relevant Medications   losartan (COZAAR) 100 MG tablet   Other Relevant Orders   CBC with Differential/Platelet (Completed)   Comprehensive metabolic panel (Completed)   Lipid panel (Completed)   TSH (Completed)     Other   Insomnia    Pt continues trazodone which helps      Routine  general medical examination at a health care facility - Primary    Reviewed health habits including diet and exercise and skin cancer prevention Reviewed appropriate screening tests for age  Also reviewed health mt list, fam hx and immunization status , as well as social and family history   See HPI Labs ordered  covid immunized  Planning flu shot in fhe fall  She will call us when ready to schedule a colonoscopy- likely fall or winter Gyn exam and pap today Pt will call to schedule her mammogram - given contact info  No falls or fractures-disc imp of ca and D for bone health (will consider dexa when on medicare)        Encounter for routine gynecological examination    Routine exam w/o abn  Pap done No c/o      Relevant Orders   Cytology - PAP([place])   Screening mammogram, encounter for    Pt wants to change to the breast center for routine screening  Order done  Given number to call and schedule appt herself       Relevant Orders   MM 3D SCREEN BREAST BILATERAL   Hyperlipidemia    Disc goals for lipids and reasons to control them Rev last labs with pt Rev low sat fat diet in detail Labs today  Last check -much improved with atorvastatin 20 mg from cardiology        Relevant Medications   losartan (COZAAR) 100 MG tablet   Other Relevant Orders   Lipid panel (Completed)

## 2019-09-11 NOTE — Assessment & Plan Note (Signed)
Routine exam w/o abn  Pap done No c/o

## 2019-09-11 NOTE — Assessment & Plan Note (Signed)
Disc goals for lipids and reasons to control them Rev last labs with pt Rev low sat fat diet in detail Labs today  Last check -much improved with atorvastatin 20 mg from cardiology

## 2019-09-12 ENCOUNTER — Encounter: Payer: Self-pay | Admitting: *Deleted

## 2019-09-12 LAB — CYTOLOGY - PAP: Diagnosis: NEGATIVE

## 2019-09-13 ENCOUNTER — Encounter: Payer: Self-pay | Admitting: Family Medicine

## 2019-09-15 ENCOUNTER — Encounter: Payer: 59 | Admitting: Family Medicine

## 2019-09-20 DIAGNOSIS — M25511 Pain in right shoulder: Secondary | ICD-10-CM | POA: Diagnosis not present

## 2019-09-28 ENCOUNTER — Encounter: Payer: Self-pay | Admitting: Cardiology

## 2019-09-28 DIAGNOSIS — I2510 Atherosclerotic heart disease of native coronary artery without angina pectoris: Secondary | ICD-10-CM | POA: Insufficient documentation

## 2019-09-28 NOTE — Progress Notes (Signed)
Date:  09/29/2019   ID:  MRYTLE BENTO, DOB [birth year], MRN [id number]   PCP:  Judy Pimple, MD  Cardiology:  Armanda Magic, MD Electrophysiologist:  None   Chief Complaint:  HTN and fm hx of CAD  History of Present Illness:    Jamie Garza is a 64 y.o. female with a hx of HTN, HLD, fm hx of CAD and hx of OSA.  She has never smoked. When I last saw her she had initially presented for evaluation due to her brother having CAD as well as AS and her mother had aortic valve problems.  She had complained of intermittent chest pressure gut thought it was related to stress.  She underwent coronary CTA a year ago and showed a coronary Ca score of 242 and mild non-obstructive CAD with 25-49% prox LAD.  Coronary FFR was normal.  She was started on statin and ASA.    She is here today for followup and is doing well.  She denies any chest pain or pressure, SOB, DOE, PND, orthopnea, LE edema, dizziness, palpitations or syncope. She is compliant with her meds and is tolerating meds with no SE.    Prior CV studies:   The following studies were reviewed today:  EKG  Past Medical History:  Diagnosis Date  . CAD (coronary artery disease), native coronary artery    coronary CTA with 25-49% prox LAD and coronary Ca score of 242 in 09/2018  . Depression with anxiety 10/30/2011  . Distal radius fracture, left 05/23/2011  . Essential hypertension 06/14/2018  . History of radius fracture 05/23/2011   Of note- nl dexa 2013-not a fragility fracture   . Hyperlipidemia 09/02/2016  . Insomnia   . Pulmonary hypertension (HCC)    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 10/2018  . SLEEP APNEA 01/04/2007   Qualifier: Diagnosis of  By: Jillyn Hidden FNP, Mcarthur Rossetti   . Spinal stenosis, lumbar    L4 and L5 - severe; L3 and L4 - moderate  . Stress reaction 07/18/2018   Past Surgical History:  Procedure Laterality Date  . WRIST FRACTURE SURGERY       Current Meds  Medication Sig  . aspirin EC 81 MG tablet  Take 1 tablet (81 mg total) by mouth daily.  Marland Kitchen atorvastatin (LIPITOR) 20 MG tablet Take 1 tablet (20 mg total) by mouth daily.  . fluticasone (FLONASE) 50 MCG/ACT nasal spray Place 2 sprays into both nostrils daily.  Marland Kitchen losartan (COZAAR) 100 MG tablet Take 1 tablet (100 mg total) by mouth daily.  Marland Kitchen LYSINE HCL PO Take 1 tablet by mouth daily.  . meloxicam (MOBIC) 7.5 MG tablet Take 7.5 mg by mouth daily.  . naproxen sodium (ALEVE) 220 MG tablet Take 220 mg by mouth at bedtime as needed.   . Probiotic Product (PROBIOTIC DAILY PO) Take 1 capsule by mouth 2 (two) times a week.   . traZODone (DESYREL) 50 MG tablet Take 1 tablet (50 mg total) by mouth at bedtime.  . valACYclovir (VALTREX) 1000 MG tablet Take 2 pills by mouth every 12 hours for one day for fever blister/cold sore     Allergies:   Patient has no known allergies.   Social History   Tobacco Use  . Smoking status: Never Smoker  . Smokeless tobacco: Never Used  Substance Use Topics  . Alcohol use: Yes    Alcohol/week: 0.0 standard drinks    Comment: occasional  . Drug use: No  Family Hx: The patient's family history includes Aortic stenosis in her brother and mother; CAD in her brother; Diabetes in her maternal aunt, maternal grandfather, mother, and paternal grandfather. There is no history of Breast cancer.  ROS:   Please see the history of present illness.     All other systems reviewed and are negative.   Labs/Other Tests and Data Reviewed:    Recent Labs: 09/11/2019: ALT 20; BUN 14; Creatinine, Ser 0.65; Hemoglobin 11.8; Platelets 250.0; Potassium 4.1; Sodium 140; TSH 1.64   Recent Lipid Panel Lab Results  Component Value Date/Time   CHOL 148 09/11/2019 10:03 AM   CHOL 138 11/04/2018 07:35 AM   TRIG 132.0 09/11/2019 10:03 AM   HDL 54.40 09/11/2019 10:03 AM   HDL 63 11/04/2018 07:35 AM   CHOLHDL 3 09/11/2019 10:03 AM   LDLCALC 67 09/11/2019 10:03 AM   LDLCALC 62 11/04/2018 07:35 AM   LDLDIRECT 137.8  12/03/2009 08:54 AM    Wt Readings from Last 3 Encounters:  09/29/19 146 lb 3.2 oz (66.3 kg)  09/11/19 146 lb 8 oz (66.5 kg)  08/02/19 146 lb 3 oz (66.3 kg)     Objective:    Vital Signs:  BP 126/78   Pulse (!) 51   Ht 5' 1.5" (1.562 m)   Wt 146 lb 3.2 oz (66.3 kg)   SpO2 97%   BMI 27.18 kg/m    GEN: Well nourished, well developed in no acute distress HEENT: Normal NECK: No JVD; No carotid bruits LYMPHATICS: No lymphadenopathy CARDIAC:RRR, no murmurs, rubs, gallops RESPIRATORY:  Clear to auscultation without rales, wheezing or rhonchi  ABDOMEN: Soft, non-tender, non-distended MUSCULOSKELETAL:  No edema; No deformity  SKIN: Warm and dry NEUROLOGIC:  Alert and oriented x 3 PSYCHIATRIC:  Normal affect    ASSESSMENT & PLAN:    1.  ASCAD -nonobstructive with 25-49% prox LAD on coronary CTA 09/2018 -she has not had any chest pain -continue ASA and statin    2.  Hypertension  -BP controlled -continue Losartan 100mg  daily -SCr was 0.65 in Aug 2021  3.  Hyperlipidemia  -LDL goal < 70 -LDL was 67 last month  -continue atorvastatin 20mg  daily   Medication Adjustments/Labs and Tests Ordered: Current medicines are reviewed at length with the patient today.  Concerns regarding medicines are outlined above.  Tests Ordered: Orders Placed This Encounter  Procedures  . EKG 12-Lead   Medication Changes: No orders of the defined types were placed in this encounter.   Disposition:  Follow up 1 year  Signed, Sep 2021, MD  09/29/2019 3:37 PM    [facility]

## 2019-09-29 ENCOUNTER — Other Ambulatory Visit: Payer: Self-pay

## 2019-09-29 ENCOUNTER — Encounter: Payer: Self-pay | Admitting: Cardiology

## 2019-09-29 ENCOUNTER — Ambulatory Visit: Payer: Medicare Other | Admitting: Cardiology

## 2019-09-29 VITALS — BP 126/78 | HR 51 | Ht 61.5 in | Wt 146.2 lb

## 2019-09-29 DIAGNOSIS — I10 Essential (primary) hypertension: Secondary | ICD-10-CM

## 2019-09-29 DIAGNOSIS — I2510 Atherosclerotic heart disease of native coronary artery without angina pectoris: Secondary | ICD-10-CM | POA: Diagnosis not present

## 2019-09-29 DIAGNOSIS — E7800 Pure hypercholesterolemia, unspecified: Secondary | ICD-10-CM | POA: Diagnosis not present

## 2019-09-29 DIAGNOSIS — I2583 Coronary atherosclerosis due to lipid rich plaque: Secondary | ICD-10-CM

## 2019-09-29 NOTE — Patient Instructions (Signed)

## 2019-10-17 ENCOUNTER — Encounter: Payer: Self-pay | Admitting: Family Medicine

## 2019-10-18 DIAGNOSIS — M25511 Pain in right shoulder: Secondary | ICD-10-CM | POA: Diagnosis not present

## 2019-10-19 ENCOUNTER — Other Ambulatory Visit: Payer: Self-pay | Admitting: Family Medicine

## 2019-10-19 ENCOUNTER — Encounter: Payer: Self-pay | Admitting: Gastroenterology

## 2019-10-19 DIAGNOSIS — Z1231 Encounter for screening mammogram for malignant neoplasm of breast: Secondary | ICD-10-CM

## 2019-10-24 ENCOUNTER — Other Ambulatory Visit: Payer: Self-pay | Admitting: Diagnostic Radiology

## 2019-10-24 ENCOUNTER — Ambulatory Visit
Admission: RE | Admit: 2019-10-24 | Discharge: 2019-10-24 | Disposition: A | Discharge status: Home / Self Care | Payer: Medicare Other | Source: Ambulatory Visit

## 2019-10-24 ENCOUNTER — Other Ambulatory Visit: Payer: Self-pay

## 2019-10-24 DIAGNOSIS — Z1231 Encounter for screening mammogram for malignant neoplasm of breast: Secondary | ICD-10-CM

## 2019-10-24 NOTE — Telephone Encounter (Signed)
Called the patient in regards to her MyChart message. She states that she was just concerned about her heart rate because she has never gotten alerts of low HR like that before. She states that today she has been feeling better. She was going to look at her phone tonight and see what her HR has been running throughout the day today and will continue to monitor tonight. She does report some chest tightness that she contributes to stress. She denies any dizziness, SOB, nausea/vomitting, diaphoresis. She states that she otherwise feels fine. She will update me tomorrow on how she feels along with heart rates.

## 2019-11-02 ENCOUNTER — Encounter: Payer: Self-pay | Admitting: Family Medicine

## 2019-11-02 DIAGNOSIS — L723 Sebaceous cyst: Secondary | ICD-10-CM | POA: Diagnosis not present

## 2019-11-02 DIAGNOSIS — L821 Other seborrheic keratosis: Secondary | ICD-10-CM | POA: Diagnosis not present

## 2019-11-02 DIAGNOSIS — L570 Actinic keratosis: Secondary | ICD-10-CM | POA: Diagnosis not present

## 2019-11-02 DIAGNOSIS — L814 Other melanin hyperpigmentation: Secondary | ICD-10-CM | POA: Diagnosis not present

## 2019-11-02 DIAGNOSIS — D485 Neoplasm of uncertain behavior of skin: Secondary | ICD-10-CM | POA: Diagnosis not present

## 2019-11-16 ENCOUNTER — Other Ambulatory Visit: Payer: Self-pay

## 2019-11-16 ENCOUNTER — Encounter: Payer: Self-pay | Admitting: Family Medicine

## 2019-11-16 DIAGNOSIS — Z1211 Encounter for screening for malignant neoplasm of colon: Secondary | ICD-10-CM | POA: Insufficient documentation

## 2019-11-16 MED ORDER — ATORVASTATIN CALCIUM 20 MG PO TABS
20.0000 mg | ORAL_TABLET | Freq: Every day | ORAL | 3 refills | Status: DC
Start: 2019-11-16 — End: 2020-08-02

## 2019-11-20 DIAGNOSIS — S62109A Fracture of unspecified carpal bone, unspecified wrist, initial encounter for closed fracture: Secondary | ICD-10-CM

## 2019-11-20 HISTORY — DX: Fracture of unspecified carpal bone, unspecified wrist, initial encounter for closed fracture: S62.109A

## 2019-12-08 ENCOUNTER — Other Ambulatory Visit: Payer: Self-pay

## 2019-12-08 ENCOUNTER — Encounter: Payer: Self-pay | Admitting: Gastroenterology

## 2019-12-08 ENCOUNTER — Ambulatory Visit (AMBULATORY_SURGERY_CENTER): Payer: Self-pay | Admitting: *Deleted

## 2019-12-08 VITALS — Ht 61.5 in | Wt 143.0 lb

## 2019-12-08 DIAGNOSIS — Z1211 Encounter for screening for malignant neoplasm of colon: Secondary | ICD-10-CM

## 2019-12-08 MED ORDER — SUTAB 1479-225-188 MG PO TABS: 1.0000 | ORAL_TABLET | ORAL | 0 refills | Status: DC

## 2019-12-08 NOTE — Progress Notes (Signed)
Patient is here in-person for PV. Patient denies any allergies to eggs or soy. Patient denies any problems with anesthesia/sedation. Patient denies any oxygen use at home. Patient denies taking any diet/weight loss medications or blood thinners. Patient is not being treated for MRSA or C-diff. Patient is aware of our care-partner policy and Covid-19 safety protocol. EMMI education assigned to the patient for the procedure, sent to MyChart.   COVID-19 vaccines completed on 11/05/2019 booster, per patient.   Prep Prescription coupon given to the patient.

## 2019-12-13 DIAGNOSIS — S52501A Unspecified fracture of the lower end of right radius, initial encounter for closed fracture: Secondary | ICD-10-CM | POA: Diagnosis not present

## 2019-12-18 DIAGNOSIS — H25813 Combined forms of age-related cataract, bilateral: Secondary | ICD-10-CM | POA: Diagnosis not present

## 2019-12-18 DIAGNOSIS — H524 Presbyopia: Secondary | ICD-10-CM | POA: Diagnosis not present

## 2019-12-20 DIAGNOSIS — S52501D Unspecified fracture of the lower end of right radius, subsequent encounter for closed fracture with routine healing: Secondary | ICD-10-CM | POA: Diagnosis not present

## 2019-12-22 ENCOUNTER — Encounter: Payer: Medicare Other | Admitting: Gastroenterology

## 2019-12-28 DIAGNOSIS — Z961 Presence of intraocular lens: Secondary | ICD-10-CM | POA: Diagnosis not present

## 2019-12-28 DIAGNOSIS — H2512 Age-related nuclear cataract, left eye: Secondary | ICD-10-CM | POA: Diagnosis not present

## 2019-12-28 DIAGNOSIS — H25042 Posterior subcapsular polar age-related cataract, left eye: Secondary | ICD-10-CM | POA: Diagnosis not present

## 2019-12-28 DIAGNOSIS — H26491 Other secondary cataract, right eye: Secondary | ICD-10-CM | POA: Diagnosis not present

## 2019-12-29 ENCOUNTER — Other Ambulatory Visit: Payer: Self-pay | Admitting: Family Medicine

## 2019-12-29 NOTE — Telephone Encounter (Signed)
Last office visit 09/11/2019 for CPE.  Last refilled 01/04/2019 for #4 with 3 refills.  No future appointments.  Ok to refill?

## 2020-01-09 DIAGNOSIS — L814 Other melanin hyperpigmentation: Secondary | ICD-10-CM | POA: Diagnosis not present

## 2020-01-09 DIAGNOSIS — L570 Actinic keratosis: Secondary | ICD-10-CM | POA: Diagnosis not present

## 2020-01-22 DIAGNOSIS — S52501D Unspecified fracture of the lower end of right radius, subsequent encounter for closed fracture with routine healing: Secondary | ICD-10-CM | POA: Diagnosis not present

## 2020-02-21 DIAGNOSIS — S52501D Unspecified fracture of the lower end of right radius, subsequent encounter for closed fracture with routine healing: Secondary | ICD-10-CM | POA: Diagnosis not present

## 2020-04-11 ENCOUNTER — Encounter: Payer: Self-pay | Admitting: Family Medicine

## 2020-04-11 ENCOUNTER — Encounter: Payer: Self-pay | Admitting: Gastroenterology

## 2020-04-12 ENCOUNTER — Telehealth: Payer: Self-pay | Admitting: *Deleted

## 2020-04-12 DIAGNOSIS — H9319 Tinnitus, unspecified ear: Secondary | ICD-10-CM

## 2020-04-12 NOTE — Telephone Encounter (Signed)
Pt had been communicating with PCP regarding ENT docs. Pt just responded to PCP's message to her and said:  I checked my insurance this morning and it looks like Suzanna Obey is in network and it said Eileen Stanford (says allergy and immunology so I don't know if this would be for my ear issue).  I'm not sure if there are others in [place]; if you can send me a few names, I'll call and double check.  Also, there are a few in [place] that is convenient if I am home and not at work Ronalee Belts and Elane Fritz.  I would like a referral please, especially if you know someone really good.

## 2020-04-13 DIAGNOSIS — H9319 Tinnitus, unspecified ear: Secondary | ICD-10-CM | POA: Insufficient documentation

## 2020-04-13 NOTE — Telephone Encounter (Signed)
I did a referral and specified Dr Jaquelyn Bitter let her know  That would be my choice

## 2020-04-15 NOTE — Telephone Encounter (Signed)
Sent mychart letting pt know  ?

## 2020-05-09 DIAGNOSIS — H61303 Acquired stenosis of external ear canal, unspecified, bilateral: Secondary | ICD-10-CM | POA: Diagnosis not present

## 2020-05-09 DIAGNOSIS — H6123 Impacted cerumen, bilateral: Secondary | ICD-10-CM | POA: Diagnosis not present

## 2020-05-09 DIAGNOSIS — H9311 Tinnitus, right ear: Secondary | ICD-10-CM | POA: Diagnosis not present

## 2020-06-12 ENCOUNTER — Other Ambulatory Visit: Payer: Self-pay | Admitting: Family Medicine

## 2020-06-12 ENCOUNTER — Ambulatory Visit (AMBULATORY_SURGERY_CENTER): Payer: Self-pay | Admitting: *Deleted

## 2020-06-12 ENCOUNTER — Other Ambulatory Visit: Payer: Self-pay

## 2020-06-12 VITALS — Ht 61.5 in | Wt 145.0 lb

## 2020-06-12 DIAGNOSIS — Z1211 Encounter for screening for malignant neoplasm of colon: Secondary | ICD-10-CM

## 2020-06-12 NOTE — Progress Notes (Signed)
No egg or soy allergy known to patient  No issues with past sedation with any surgeries or procedures Patient denies ever being told they had issues or difficulty with intubation  No FH of Malignant Hyperthermia No diet pills per patient No home 02 use per patient  No blood thinners per patient  Pt denies issues with constipation  No A fib or A flutter  EMMI video to pt or via [place] 19 guidelines implemented in PV today with Pt and RN  Pt is fully vaccinated  for Covid  Pt has a Sutab Kit at home already   Due to the COVID-19 pandemic we are asking patients to follow certain guidelines.  Pt aware of COVID protocols and [facility] guidelines

## 2020-06-25 ENCOUNTER — Encounter: Payer: Self-pay | Admitting: Certified Registered Nurse Anesthetist

## 2020-06-26 ENCOUNTER — Other Ambulatory Visit: Payer: Self-pay

## 2020-06-26 ENCOUNTER — Ambulatory Visit (AMBULATORY_SURGERY_CENTER): Payer: Medicare Other | Admitting: Gastroenterology

## 2020-06-26 ENCOUNTER — Encounter: Payer: Self-pay | Admitting: Gastroenterology

## 2020-06-26 VITALS — BP 107/62 | HR 53 | Temp 96.2°F | Resp 18 | Ht 61.5 in | Wt 145.0 lb

## 2020-06-26 DIAGNOSIS — Z1211 Encounter for screening for malignant neoplasm of colon: Secondary | ICD-10-CM | POA: Diagnosis not present

## 2020-06-26 DIAGNOSIS — G4733 Obstructive sleep apnea (adult) (pediatric): Secondary | ICD-10-CM | POA: Diagnosis not present

## 2020-06-26 DIAGNOSIS — Z86010 Personal history of colonic polyps: Secondary | ICD-10-CM | POA: Diagnosis not present

## 2020-06-26 DIAGNOSIS — I2510 Atherosclerotic heart disease of native coronary artery without angina pectoris: Secondary | ICD-10-CM | POA: Diagnosis not present

## 2020-06-26 MED ORDER — SODIUM CHLORIDE 0.9 % IV SOLN: 500.0000 mL | Freq: Once | INTRAVENOUS | Status: DC

## 2020-06-26 NOTE — Progress Notes (Signed)
Report given to PACU, vss 

## 2020-06-26 NOTE — Patient Instructions (Signed)
Resume previous diet Continue previous medications Repeat colonoscopy in 10 years YOU HAD AN ENDOSCOPIC PROCEDURE TODAY AT THE [facility]:   Refer to the procedure report that was given to you for any specific questions about what was found during the examination.  If the procedure report does not answer your questions, please call your gastroenterologist to clarify.  If you requested that your care partner not be given the details of your procedure findings, then the procedure report has been included in a sealed envelope for you to review at your convenience later.  YOU SHOULD EXPECT: Some feelings of bloating in the abdomen. Passage of more gas than usual.  Walking can help get rid of the air that was put into your GI tract during the procedure and reduce the bloating. If you had a lower endoscopy (such as a colonoscopy or flexible sigmoidoscopy) you may notice spotting of blood in your stool or on the toilet paper. If you underwent a bowel prep for your procedure, you may not have a normal bowel movement for a few days.  Please Note:  You might notice some irritation and congestion in your nose or some drainage.  This is from the oxygen used during your procedure.  There is no need for concern and it should clear up in a day or so.  SYMPTOMS TO REPORT IMMEDIATELY:   Following lower endoscopy (colonoscopy or flexible sigmoidoscopy):  Excessive amounts of blood in the stool  Significant tenderness or worsening of abdominal pains  Swelling of the abdomen that is new, acute  Fever of 100F or higher   Following upper endoscopy (EGD)  Vomiting of blood or coffee ground material  New chest pain or pain under the shoulder blades  Painful or persistently difficult swallowing  New shortness of breath  Fever of 100F or higher  Black, tarry-looking stools  For urgent or emergent issues, a gastroenterologist can be reached at any hour by calling [phone]. Do not use MyChart  messaging for urgent concerns.    DIET:  We do recommend a small meal at first, but then you may proceed to your regular diet.  Drink plenty of fluids but you should avoid alcoholic beverages for 24 hours.  ACTIVITY:  You should plan to take it easy for the rest of today and you should NOT DRIVE or use heavy machinery until tomorrow (because of the sedation medicines used during the test).    FOLLOW UP: Our staff will call the number listed on your records 48-72 hours following your procedure to check on you and address any questions or concerns that you may have regarding the information given to you following your procedure. If we do not reach you, we will leave a message.  We will attempt to reach you two times.  During this call, we will ask if you have developed any symptoms of COVID 19. If you develop any symptoms (ie: fever, flu-like symptoms, shortness of breath, cough etc.) before then, please call [phone].  If you test positive for Covid 19 in the 2 weeks post procedure, please call and report this information to [place].    If any biopsies were taken you will be contacted by phone or by letter within the next 1-3 weeks.  Please call us at [phone] if you have not heard about the biopsies in 3 weeks.    SIGNATURES/CONFIDENTIALITY: You and/or your care partner have signed paperwork which will be entered into your electronic medical record.  These  signatures attest to the fact that that the information above on your After Visit Summary has been reviewed and is understood.  Full responsibility of the confidentiality of this discharge information lies with you and/or your care-partner.YOU HAD AN ENDOSCOPIC PROCEDURE TODAY AT THE [facility]:   Refer to the procedure report that was given to you for any specific questions about what was found during the examination.  If the procedure report does not answer your questions, please call your gastroenterologist to clarify.  If you  requested that your care partner not be given the details of your procedure findings, then the procedure report has been included in a sealed envelope for you to review at your convenience later.  YOU SHOULD EXPECT: Some feelings of bloating in the abdomen. Passage of more gas than usual.  Walking can help get rid of the air that was put into your GI tract during the procedure and reduce the bloating. If you had a lower endoscopy (such as a colonoscopy or flexible sigmoidoscopy) you may notice spotting of blood in your stool or on the toilet paper. If you underwent a bowel prep for your procedure, you may not have a normal bowel movement for a few days.  Please Note:  You might notice some irritation and congestion in your nose or some drainage.  This is from the oxygen used during your procedure.  There is no need for concern and it should clear up in a day or so.  SYMPTOMS TO REPORT IMMEDIATELY:   Following lower endoscopy (colonoscopy or flexible sigmoidoscopy):  Excessive amounts of blood in the stool  Significant tenderness or worsening of abdominal pains  Swelling of the abdomen that is new, acute  Fever of 100F or higher  For urgent or emergent issues, a gastroenterologist can be reached at any hour by calling [phone]. Do not use MyChart messaging for urgent concerns.    DIET:  We do recommend a small meal at first, but then you may proceed to your regular diet.  Drink plenty of fluids but you should avoid alcoholic beverages for 24 hours.  ACTIVITY:  You should plan to take it easy for the rest of today and you should NOT DRIVE or use heavy machinery until tomorrow (because of the sedation medicines used during the test).    FOLLOW UP: Our staff will call the number listed on your records 48-72 hours following your procedure to check on you and address any questions or concerns that you may have regarding the information given to you following your procedure. If we do not  reach you, we will leave a message.  We will attempt to reach you two times.  During this call, we will ask if you have developed any symptoms of COVID 19. If you develop any symptoms (ie: fever, flu-like symptoms, shortness of breath, cough etc.) before then, please call [phone].  If you test positive for Covid 19 in the 2 weeks post procedure, please call and report this information to [place].    If any biopsies were taken you will be contacted by phone or by letter within the next 1-3 weeks.  Please call us at [phone] if you have not heard about the biopsies in 3 weeks.    SIGNATURES/CONFIDENTIALITY: You and/or your care partner have signed paperwork which will be entered into your electronic medical record.  These signatures attest to the fact that that the information above on your After Visit Summary has been reviewed and  is understood.  Full responsibility of the confidentiality of this discharge information lies with you and/or your care-partner.

## 2020-06-26 NOTE — Progress Notes (Signed)
VS by CW  Pt's states no medical or surgical changes since previsit or office visit.  

## 2020-06-26 NOTE — Op Note (Signed)
[facility] Patient Name: Jamie Garza Procedure Date: 06/26/2020 8:44 AM MRN: [id number] Endoscopist: Napoleon Form , MD Age: 65 Referring MD:  Date of Birth: 1955/05/17 Gender: Female Account #: [id number] Procedure:                Colonoscopy Indications:              Screening for colorectal malignant neoplasm Medicines:                Monitored Anesthesia Care Procedure:                Pre-Anesthesia Assessment:                           - Prior to the procedure, a History and Physical                            was performed, and patient medications and                            allergies were reviewed. The patient's tolerance of                            previous anesthesia was also reviewed. The risks                            and benefits of the procedure and the sedation                            options and risks were discussed with the patient.                            All questions were answered, and informed consent                            was obtained. Prior Anticoagulants: The patient has                            taken no previous anticoagulant or antiplatelet                            agents. ASA Grade Assessment: II - A patient with                            mild systemic disease. After reviewing the risks                            and benefits, the patient was deemed in                            satisfactory condition to undergo the procedure.                           After obtaining informed consent, the colonoscope  was passed under direct vision. Throughout the                            procedure, the patient's blood pressure, pulse, and                            oxygen saturations were monitored continuously. The                            Olympus [id number]-[id number] (#[id number]) Colonoscope was                            introduced through the anus and advanced to the the                            cecum,  identified by appendiceal orifice and                            ileocecal valve. The colonoscopy was performed                            without difficulty. The patient tolerated the                            procedure well. The quality of the bowel                            preparation was excellent. The ileocecal valve,                            appendiceal orifice, and rectum were photographed. Scope In: 9:07:03 AM Scope Out: 9:19:17 AM Scope Withdrawal Time: 0 hours 7 minutes 6 seconds  Total Procedure Duration: 0 hours 12 minutes 14 seconds  Findings:                 The perianal and digital rectal examinations were                            normal.                           A few small and large-mouthed diverticula were                            found in the sigmoid colon, descending colon,                            transverse colon and ascending colon.                           Non-bleeding external and internal hemorrhoids were                            found during retroflexion. The hemorrhoids were  medium-sized. Complications:            No immediate complications. Estimated Blood Loss:     Estimated blood loss was minimal. Impression:               - Diverticulosis in the sigmoid colon, in the                            descending colon, in the transverse colon and in                            the ascending colon.                           - Non-bleeding external and internal hemorrhoids.                           - No specimens collected. Recommendation:           - Patient has a contact number available for                            emergencies. The signs and symptoms of potential                            delayed complications were discussed with the                            patient. Return to normal activities tomorrow.                            Written discharge instructions were provided to the                             patient.                           - Resume previous diet.                           - Continue present medications.                           - Repeat colonoscopy in 10 years for screening                            purposes. Napoleon Form, MD 06/26/2020 9:23:48 AM This report has been signed electronically.

## 2020-06-28 ENCOUNTER — Telehealth: Payer: Self-pay | Admitting: *Deleted

## 2020-06-28 NOTE — Telephone Encounter (Signed)
  Follow up Call-  Call back number 06/26/2020  Post procedure Call Back phone  # [phone]  Permission to leave phone message Yes  Some recent data might be hidden     Patient questions:  Do you have a fever, pain , or abdominal swelling? No. Pain Score  0 *  Have you tolerated food without any problems? Yes.    Have you been able to return to your normal activities? Yes.    Do you have any questions about your discharge instructions: Diet   No. Medications  No. Follow up visit  No.  Do you have questions or concerns about your Care? No.  Actions: * If pain score is 4 or above: No action needed, pain <4.  Have you developed a fever since your procedure? no  2.   Have you had an respiratory symptoms (SOB or cough) since your procedure? no  3.   Have you tested positive for COVID 19 since your procedure no  4.   Have you had any family members/close contacts diagnosed with the COVID 19 since your procedure?  no   If yes to any of these questions please route to Laverna Peace, RN and Karlton Lemon, RN

## 2020-07-09 DIAGNOSIS — H25813 Combined forms of age-related cataract, bilateral: Secondary | ICD-10-CM | POA: Diagnosis not present

## 2020-07-12 ENCOUNTER — Other Ambulatory Visit: Payer: Self-pay | Admitting: Family Medicine

## 2020-08-02 ENCOUNTER — Other Ambulatory Visit: Payer: Self-pay | Admitting: Cardiology

## 2020-08-15 DIAGNOSIS — I10 Essential (primary) hypertension: Secondary | ICD-10-CM | POA: Diagnosis not present

## 2020-08-15 DIAGNOSIS — Z6826 Body mass index (BMI) 26.0-26.9, adult: Secondary | ICD-10-CM | POA: Diagnosis not present

## 2020-08-15 DIAGNOSIS — M4310 Spondylolisthesis, site unspecified: Secondary | ICD-10-CM | POA: Diagnosis not present

## 2020-09-13 ENCOUNTER — Encounter: Payer: Medicare Other | Admitting: Family Medicine

## 2020-10-01 ENCOUNTER — Other Ambulatory Visit: Payer: Self-pay | Admitting: Family Medicine

## 2020-10-01 DIAGNOSIS — Z1231 Encounter for screening mammogram for malignant neoplasm of breast: Secondary | ICD-10-CM

## 2020-10-02 ENCOUNTER — Encounter: Payer: Medicare Other | Admitting: Family Medicine

## 2020-10-21 ENCOUNTER — Encounter: Payer: Self-pay | Admitting: Family Medicine

## 2020-10-21 ENCOUNTER — Ambulatory Visit (INDEPENDENT_AMBULATORY_CARE_PROVIDER_SITE_OTHER): Payer: Medicare Other | Admitting: Family Medicine

## 2020-10-21 ENCOUNTER — Other Ambulatory Visit: Payer: Self-pay

## 2020-10-21 VITALS — BP 128/70 | HR 79 | Temp 97.9°F | Ht 61.75 in | Wt 141.0 lb

## 2020-10-21 DIAGNOSIS — I10 Essential (primary) hypertension: Secondary | ICD-10-CM

## 2020-10-21 DIAGNOSIS — F5104 Psychophysiologic insomnia: Secondary | ICD-10-CM

## 2020-10-21 DIAGNOSIS — G4730 Sleep apnea, unspecified: Secondary | ICD-10-CM

## 2020-10-21 DIAGNOSIS — Z0000 Encounter for general adult medical examination without abnormal findings: Secondary | ICD-10-CM

## 2020-10-21 DIAGNOSIS — E7800 Pure hypercholesterolemia, unspecified: Secondary | ICD-10-CM | POA: Diagnosis not present

## 2020-10-21 DIAGNOSIS — E2839 Other primary ovarian failure: Secondary | ICD-10-CM | POA: Insufficient documentation

## 2020-10-21 DIAGNOSIS — Z23 Encounter for immunization: Secondary | ICD-10-CM | POA: Diagnosis not present

## 2020-10-21 DIAGNOSIS — R911 Solitary pulmonary nodule: Secondary | ICD-10-CM

## 2020-10-21 LAB — COMPREHENSIVE METABOLIC PANEL
ALT: 15 U/L (ref 0–35)
AST: 22 U/L (ref 0–37)
Albumin: 4.2 g/dL (ref 3.5–5.2)
Alkaline Phosphatase: 65 U/L (ref 39–117)
BUN: 14 mg/dL (ref 6–23)
CO2: 28 mEq/L (ref 19–32)
Calcium: 9.6 mg/dL (ref 8.4–10.5)
Chloride: 106 mEq/L (ref 96–112)
Creatinine, Ser: 0.64 mg/dL (ref 0.40–1.20)
GFR: 92.35 mL/min (ref >60.00)
Glucose, Bld: 90 mg/dL (ref 70–99)
Potassium: 4.3 mEq/L (ref 3.5–5.1)
Sodium: 141 mEq/L (ref 135–145)
Total Bilirubin: 0.5 mg/dL (ref 0.2–1.2)
Total Protein: 7.1 g/dL (ref 6.0–8.3)

## 2020-10-21 LAB — CBC WITH DIFFERENTIAL/PLATELET
Basophils Absolute: 0 10*3/uL (ref 0.0–0.1)
Basophils Relative: 0.5 % (ref 0.0–3.0)
Eosinophils Absolute: 0.1 10*3/uL (ref 0.0–0.7)
Eosinophils Relative: 1.7 % (ref 0.0–5.0)
HCT: 39.4 % (ref 36.0–46.0)
Hemoglobin: 12.8 g/dL (ref 12.0–15.0)
Lymphocytes Relative: 44.2 % (ref 12.0–46.0)
Lymphs Abs: 2.2 10*3/uL (ref 0.7–4.0)
MCHC: 32.4 g/dL (ref 30.0–36.0)
MCV: 91.7 fl (ref 78.0–100.0)
Monocytes Absolute: 0.5 10*3/uL (ref 0.1–1.0)
Monocytes Relative: 10.9 % (ref 3.0–12.0)
Neutro Abs: 2.2 10*3/uL (ref 1.4–7.7)
Neutrophils Relative %: 42.7 % — ABNORMAL LOW (ref 43.0–77.0)
Platelets: 205 10*3/uL (ref 150.0–400.0)
RBC: 4.3 Mil/uL (ref 3.87–5.11)
RDW: 15.2 % (ref 11.5–15.5)
WBC: 5 10*3/uL (ref 4.0–10.5)

## 2020-10-21 LAB — LIPID PANEL
Cholesterol: 141 mg/dL (ref 0–200)
HDL: 56 mg/dL (ref >39.00)
LDL Cholesterol: 64 mg/dL (ref 0–99)
NonHDL: 84.63
Total CHOL/HDL Ratio: 3
Triglycerides: 101 mg/dL (ref 0.0–149.0)
VLDL: 20.2 mg/dL (ref 0.0–40.0)

## 2020-10-21 LAB — TSH: TSH: 1.92 u[IU]/mL (ref 0.35–5.50)

## 2020-10-21 MED ORDER — TRAZODONE HCL 50 MG PO TABS: 50.0000 mg | ORAL_TABLET | Freq: Every day | ORAL | 3 refills | Status: DC

## 2020-10-21 MED ORDER — LOSARTAN POTASSIUM 100 MG PO TABS: 100.0000 mg | ORAL_TABLET | Freq: Every day | ORAL | 3 refills | Status: DC

## 2020-10-21 NOTE — Progress Notes (Signed)
Subjective:    Patient ID: Jamie Garza, female    DOB: 07-23-1955, 65 y.o.   MRN: [id number]  This visit occurred during the SARS-CoV-2 public health emergency.  Safety protocols were in place, including screening questions prior to the visit, additional usage of staff PPE, and extensive cleaning of exam room while observing appropriate contact time as indicated for disinfecting solutions.   HPI Here for health maintenance exam and to review chronic medical problems     Wt Readings from Last 3 Encounters:  10/21/20 141 lb (64 kg)  06/26/20 145 lb (65.8 kg)  06/12/20 145 lb (65.8 kg)   26.00 kg/m  Doing well overall  Taking care of herself  Working 5 d per week Working full time, plans to go part time in the future   Covid immunized- Flu shot-planning today Tdap 10/20 Zoster status -immunized  Pna vaccine -today   Mammogram 10/21, has it scheduled for oct 17th Self breast exam-no lumps   Pap nl 8/21  No gyn problems   Colonoscopy 6/22   Dexa 6/13  Falls -one last fall Fractures-past radius fracture times 2  Supplements -needs to be more compliant with ca and D Exercise - not as hard as she used to due to back and knee    HTN bp is stable today  No cp or palpitations or headaches or edema  No side effects to medicines  BP Readings from Last 3 Encounters:  10/21/20 128/70  06/26/20 107/62  09/29/19 126/78     Pulse Readings from Last 3 Encounters:  10/21/20 79  06/26/20 (!) 53  09/29/19 (!) 51   Losartan 100 mg daily   Has OSA with 2ndary pulm HTN  H/o pulmonary nodules in RML -stable 5/21, recommened future CT 18-24 mo  Hyperlipidemia Lab Results  Component Value Date   CHOL 148 09/11/2019   HDL 54.40 09/11/2019   LDLCALC 67 09/11/2019   LDLDIRECT 137.8 12/03/2009   TRIG 132.0 09/11/2019   CHOLHDL 3 09/11/2019   Due for labs  Taking atorvastatin 40 mg daily  Patient Active Problem List   Diagnosis Date Noted   Estrogen deficiency  10/21/2020   Tinnitus 04/13/2020   Colon cancer screening 11/16/2019   CAD (coronary artery disease), native coronary artery    Pulmonary nodule 06/01/2019   H/O cold sores 01/04/2019   Family history of heart disease 08/05/2018   Stress reaction 07/18/2018   Essential hypertension 06/14/2018   Spinal stenosis 09/02/2016   Hyperlipidemia 09/02/2016   History of radius fracture 05/23/2011   Encounter for routine gynecological examination 12/19/2010   Screening mammogram, encounter for 12/19/2010   Routine general medical examination at a health care facility 12/15/2010   Insomnia 11/20/2008   Sleep apnea 01/04/2007   Past Medical History:  Diagnosis Date   CAD (coronary artery disease), native coronary artery    coronary CTA with 25-49% prox LAD and coronary Ca score of 242 in 09/2018   Depression with anxiety 10/30/2011   Distal radius fracture, left 05/23/2011   Essential hypertension 06/14/2018   History of radius fracture 05/23/2011   Of note- nl dexa 2013-not a fragility fracture    Hyperlipidemia 09/02/2016   Insomnia    Pulmonary hypertension (HCC)    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 10/2018   SLEEP APNEA 01/04/2007   Qualifier: Diagnosis of  By: Jillyn Hidden FNP, Mcarthur Rossetti    Spinal stenosis, lumbar    L4 and L5 - severe; L3  and L4 - moderate   Stress reaction 07/18/2018   Wrist fracture 11/2019   right    Past Surgical History:  Procedure Laterality Date   CARPAL TUNNEL RELEASE Right 12/2018   COLONOSCOPY  08/2009   Jarold Motto  tic's   WRIST FRACTURE SURGERY  2013   Social History   Tobacco Use   Smoking status: Never   Smokeless tobacco: Never  Vaping Use   Vaping Use: Never used  Substance Use Topics   Alcohol use: Yes    Comment: occasional   Drug use: No   Family History  Problem Relation Age of Onset   Diabetes Mother        borderline DM   Aortic stenosis Mother    Diabetes Maternal Aunt    Diabetes Maternal Grandfather    Diabetes  Paternal Grandfather    CAD Brother    Aortic stenosis Brother    Breast cancer Neg Hx    Colon cancer Neg Hx    Colon polyps Neg Hx    Esophageal cancer Neg Hx    Rectal cancer Neg Hx    Stomach cancer Neg Hx    No Known Allergies Current Outpatient Medications on File Prior to Visit  Medication Sig Dispense Refill   aspirin EC 81 MG tablet Take 1 tablet (81 mg total) by mouth daily. 90 tablet 3   atorvastatin (LIPITOR) 20 MG tablet Take 1 tablet (20 mg total) by mouth daily. Please make yearly appt with Dr. Mayford Knife for September 2022 for future refills. Thank you 1st attempt 90 tablet 0   Ca Carbonate-Mag Hydroxide (ROLAIDS EXTRA STRENGTH PO) Take by mouth. Takes for Calcium supplement     fluticasone (FLONASE) 50 MCG/ACT nasal spray Place 2 sprays into both nostrils daily. 16 g 6   LYSINE HCL PO Take 1 tablet by mouth daily.     naproxen sodium (ALEVE) 220 MG tablet Take 220 mg by mouth at bedtime as needed.      Probiotic Product (PROBIOTIC DAILY PO) Take 1 capsule by mouth 2 (two) times a week.      TURMERIC PO Take 2,000 mg by mouth daily.     valACYclovir (VALTREX) 1000 MG tablet TAKE TWO TABLETS BY MOUTH EVERY TWELVE HOURS FOR ONE DAY FOR FEVER BLISTER/COLD SORE 4 tablet 11   No current facility-administered medications on file prior to visit.     Review of Systems  Constitutional:  Negative for activity change, appetite change, fatigue, fever and unexpected weight change.  HENT:  Negative for congestion, ear pain, rhinorrhea, sinus pressure and sore throat.   Eyes:  Negative for pain, redness and visual disturbance.  Respiratory:  Negative for cough, shortness of breath and wheezing.   Cardiovascular:  Negative for chest pain and palpitations.  Gastrointestinal:  Negative for abdominal pain, blood in stool, constipation and diarrhea.  Endocrine: Negative for polydipsia and polyuria.  Genitourinary:  Negative for dysuria, frequency and urgency.  Musculoskeletal:  Positive  for back pain. Negative for arthralgias and myalgias.  Skin:  Negative for pallor and rash.  Allergic/Immunologic: Negative for environmental allergies.  Neurological:  Negative for dizziness, syncope and headaches.  Hematological:  Negative for adenopathy. Does not bruise/bleed easily.  Psychiatric/Behavioral:  Negative for decreased concentration and dysphoric mood. The patient is not nervous/anxious.       Objective:   Physical Exam Constitutional:      General: She is not in acute distress.    Appearance: Normal appearance. She is well-developed and  normal weight. She is not ill-appearing or diaphoretic.  HENT:     Head: Normocephalic and atraumatic.     Right Ear: Tympanic membrane, ear canal and external ear normal.     Left Ear: Tympanic membrane, ear canal and external ear normal.     Nose: Nose normal. No congestion.     Mouth/Throat:     Mouth: Mucous membranes are moist.     Pharynx: Oropharynx is clear. No posterior oropharyngeal erythema.  Eyes:     General: No scleral icterus.    Extraocular Movements: Extraocular movements intact.     Conjunctiva/sclera: Conjunctivae normal.     Pupils: Pupils are equal, round, and reactive to light.  Neck:     Thyroid: No thyromegaly.     Vascular: No carotid bruit or JVD.  Cardiovascular:     Rate and Rhythm: Normal rate and regular rhythm.     Pulses: Normal pulses.     Heart sounds: Normal heart sounds.    No gallop.  Pulmonary:     Effort: Pulmonary effort is normal. No respiratory distress.     Breath sounds: Normal breath sounds. No wheezing.     Comments: Good air exch Chest:     Chest wall: No tenderness.  Abdominal:     General: Bowel sounds are normal. There is no distension or abdominal bruit.     Palpations: Abdomen is soft. There is no mass.     Tenderness: There is no abdominal tenderness.     Hernia: No hernia is present.  Genitourinary:    Comments: Breast exam: No mass, nodules, thickening, tenderness,  bulging, retraction, inflamation, nipple discharge or skin changes noted.  No axillary or clavicular LA.     Musculoskeletal:        General: No tenderness. Normal range of motion.     Cervical back: Normal range of motion and neck supple. No rigidity. No muscular tenderness.     Right lower leg: No edema.     Left lower leg: No edema.     Comments: No kyphosis  No acute joint changes   Lymphadenopathy:     Cervical: No cervical adenopathy.  Skin:    General: Skin is warm and dry.     Coloration: Skin is not pale.     Findings: No erythema or rash.  Neurological:     Mental Status: She is alert. Mental status is at baseline.     Cranial Nerves: No cranial nerve deficit.     Motor: No abnormal muscle tone.     Coordination: Coordination normal.     Gait: Gait normal.     Deep Tendon Reflexes: Reflexes are normal and symmetric. Reflexes normal.  Psychiatric:        Mood and Affect: Mood normal.        Cognition and Memory: Cognition and memory normal.          Assessment & Plan:   Problem List Items Addressed This Visit       Cardiovascular and Mediastinum   Essential hypertension    bp in fair control at this time  BP Readings from Last 1 Encounters:  10/21/20 128/70  No changes needed Most recent labs reviewed  Disc lifstyle change with low sodium diet and exercise  Plan to continue losartan 100 mg daily        Relevant Medications   losartan (COZAAR) 100 MG tablet   Other Relevant Orders   CBC with Differential/Platelet   Comprehensive metabolic  panel   Lipid panel   TSH     Respiratory   Sleep apnea    Pt is unsure if she actually has this          Other   Insomnia   Routine general medical examination at a health care facility - Primary    Reviewed health habits including diet and exercise and skin cancer prevention Reviewed appropriate screening tests for age  Also reviewed health mt list, fam hx and immunization status , as well as social and  family history   See HPI Labs ordered Flu shot and pna 20 vaccine today  Mammogram scheduled for 10/17 Pap utd Colonoscopy utd dexa ordered, has had a fall and 2 fx in adulthood Encouraged regular exercise        Hyperlipidemia    Disc goals for lipids and reasons to control them Rev last labs with pt Rev low sat fat diet in detail Lab today  Taking atorvastatin 40 mg daily        Relevant Medications   losartan (COZAAR) 100 MG tablet   Other Relevant Orders   Lipid panel   Pulmonary nodule    She is due for re check of nodules in RML Per pt she does this with cardiology and plans to schedule CT      Estrogen deficiency   Relevant Orders   DG Bone Density   Other Visit Diagnoses     Need for pneumococcal vaccination       Relevant Orders   Pneumococcal conjugate vaccine 20-valent (Prevnar 20) (Completed)   Need for influenza vaccination       Relevant Orders   Flu Vaccine QUAD High Dose(Fluad) (Completed)

## 2020-10-21 NOTE — Assessment & Plan Note (Signed)
She is due for re check of nodules in RML Per pt she does this with cardiology and plans to schedule CT

## 2020-10-21 NOTE — Patient Instructions (Addendum)
Try to get 1200-1500 mg of calcium per day with at least 1000 iu of vitamin D - for bone health  Flu shot today  Pneumonia shot today   Call and schedule your bone density test when it is convenient   Call Dr Mayford Knife to schedule your CT scan to check pulmonary nodule

## 2020-10-21 NOTE — Assessment & Plan Note (Signed)
Disc goals for lipids and reasons to control them Rev last labs with pt Rev low sat fat diet in detail Lab today  Taking atorvastatin 40 mg daily

## 2020-10-21 NOTE — Assessment & Plan Note (Signed)
bp in fair control at this time  BP Readings from Last 1 Encounters:  10/21/20 128/70   No changes needed Most recent labs reviewed  Disc lifstyle change with low sodium diet and exercise  Plan to continue losartan 100 mg daily

## 2020-10-21 NOTE — Assessment & Plan Note (Signed)
Pt is unsure if she actually has this

## 2020-10-21 NOTE — Assessment & Plan Note (Signed)
Reviewed health habits including diet and exercise and skin cancer prevention Reviewed appropriate screening tests for age  Also reviewed health mt list, fam hx and immunization status , as well as social and family history   See HPI Labs ordered Flu shot and pna 20 vaccine today  Mammogram scheduled for 10/17 Pap utd Colonoscopy utd dexa ordered, has had a fall and 2 fx in adulthood Encouraged regular exercise

## 2020-10-23 IMAGING — MG DIGITAL SCREENING BILAT W/ TOMO W/ CAD
8 series · 9 of 24 positions shown · non-contrast
Comparison: Previous exam(s).

CLINICAL DATA: Screening.

EXAM:
DIGITAL SCREENING BILATERAL MAMMOGRAM WITH TOMO AND CAD

[L MLO synth-2D]
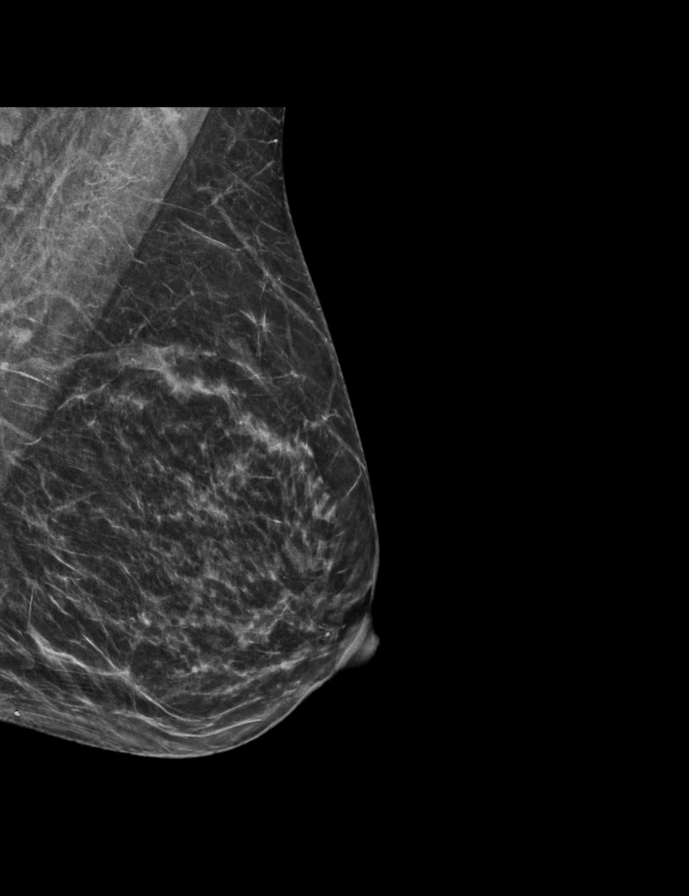

[L CC synth-2D]
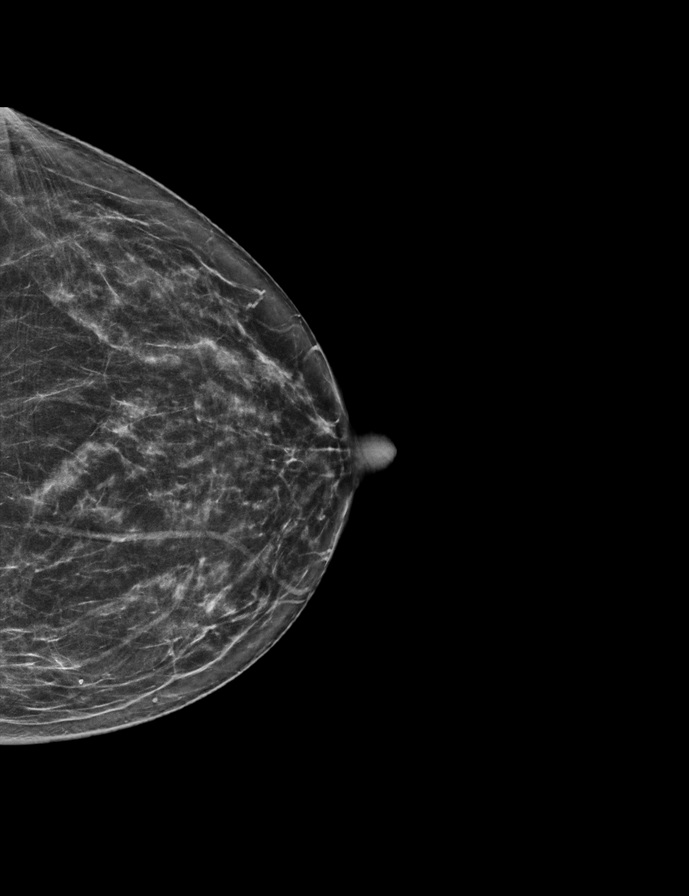

[R CC synth-2D]
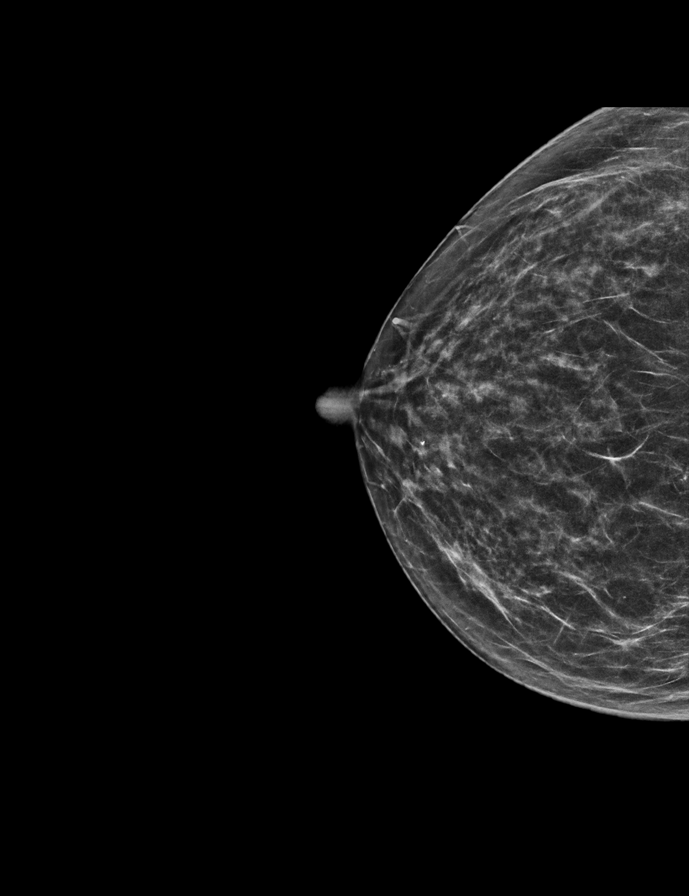

[R MLO synth-2D]
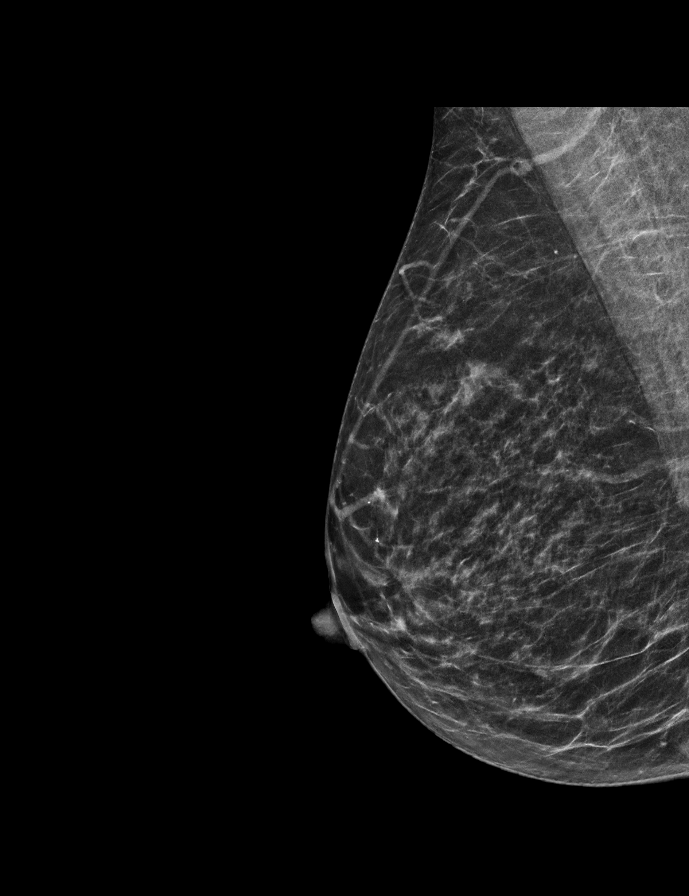

[L CC tomo · 2 of 55 frames shown]
[frame 18/55]
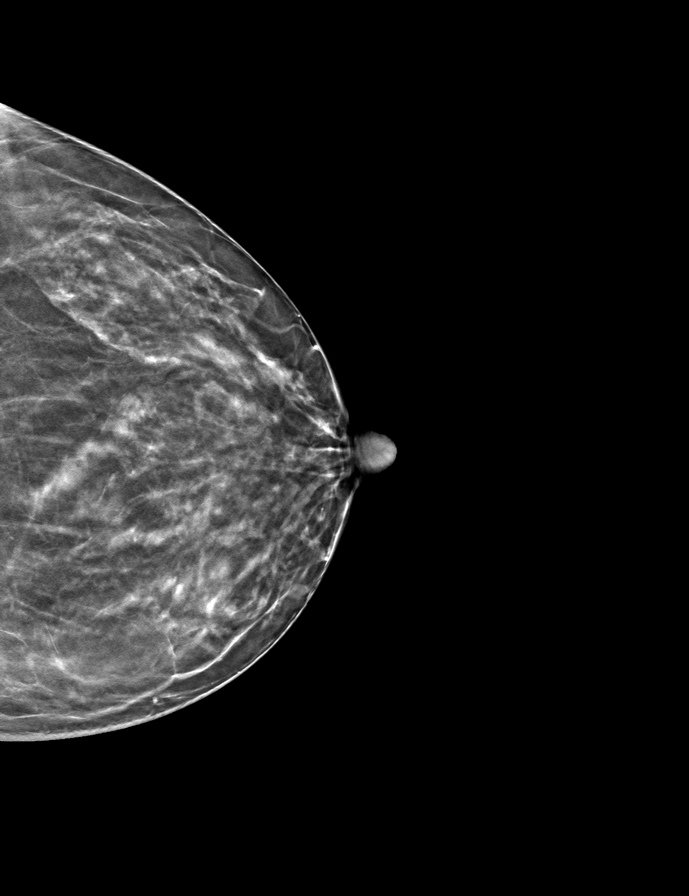
[frame 28/55]
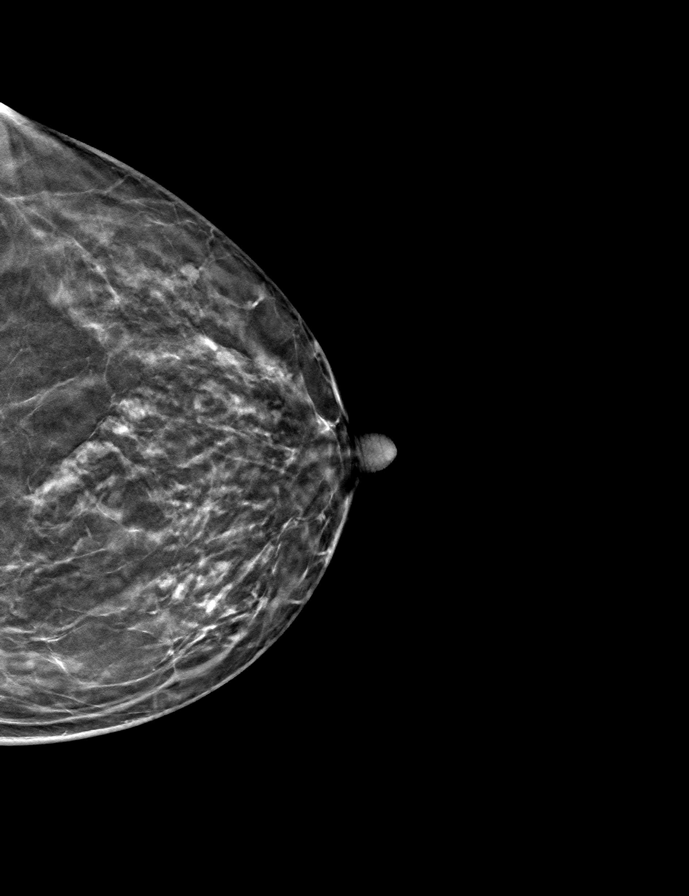

[L MLO tomo · tomo slice 27/53.0]
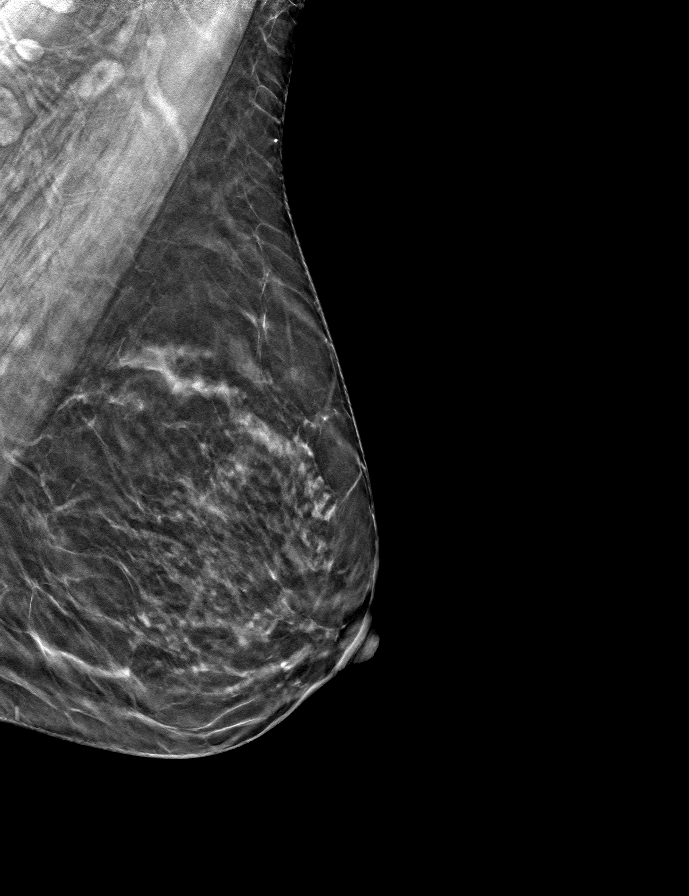

[R CC tomo · tomo slice 27/52.0]
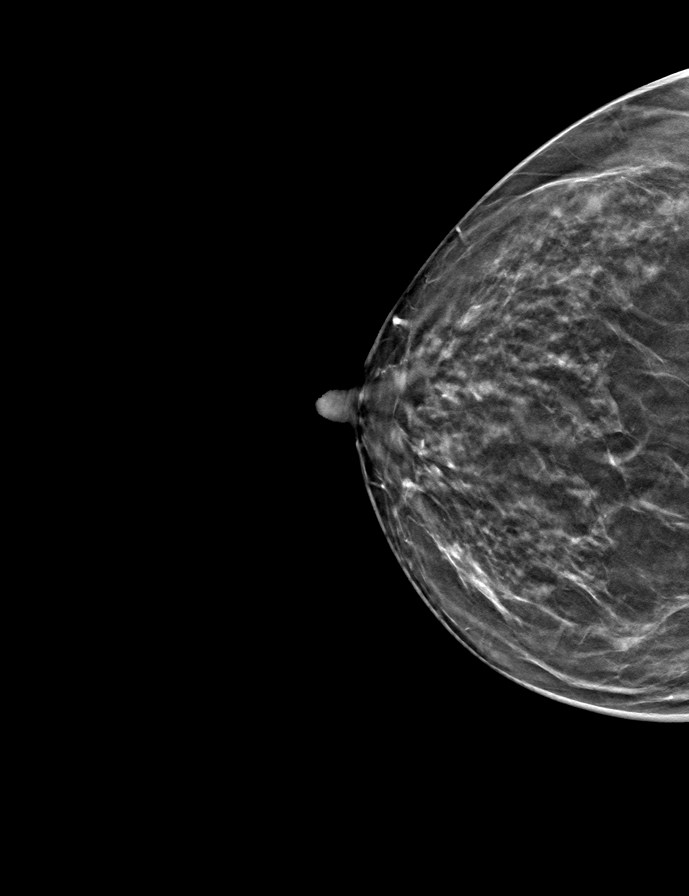

[R MLO tomo · tomo slice 25/50.0]
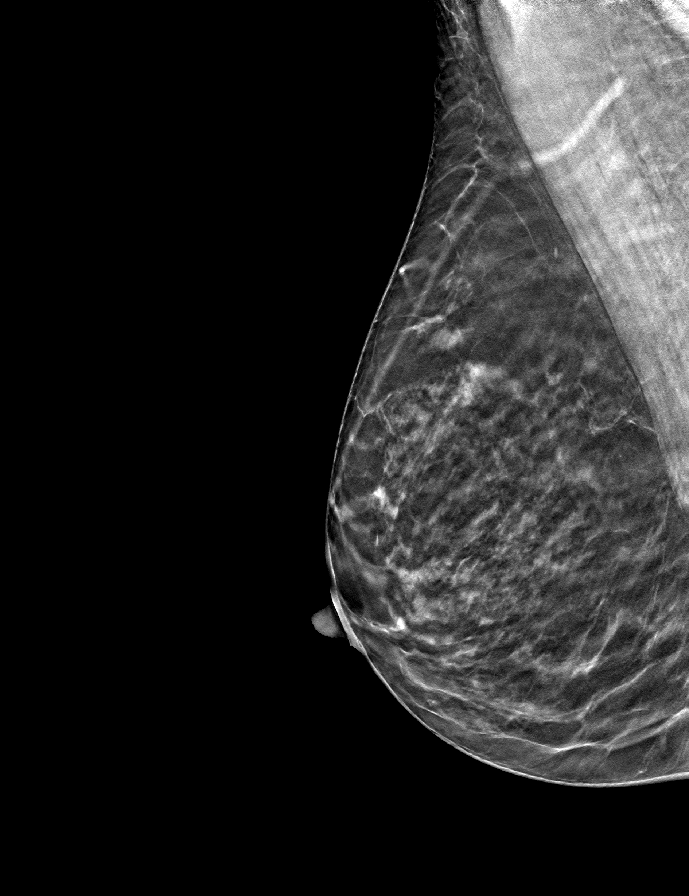

[9 of 24 positions shown; findings below may reference images not displayed]

ACR Breast Density Category c: The breast tissue is heterogeneously
dense, which may obscure small masses.
FINDINGS: There are no findings suspicious for malignancy. Images were
processed with CAD.
IMPRESSION: No mammographic evidence of malignancy. A result letter of this
screening mammogram will be mailed directly to the patient.

RECOMMENDATION:
Screening mammogram in one year. (Code:FT-[id number])

BI-RADS CATEGORY  1: Negative.

## 2020-10-24 DIAGNOSIS — M48062 Spinal stenosis, lumbar region with neurogenic claudication: Secondary | ICD-10-CM | POA: Diagnosis not present

## 2020-10-24 DIAGNOSIS — I10 Essential (primary) hypertension: Secondary | ICD-10-CM | POA: Diagnosis not present

## 2020-10-24 DIAGNOSIS — Z6826 Body mass index (BMI) 26.0-26.9, adult: Secondary | ICD-10-CM | POA: Diagnosis not present

## 2020-11-04 ENCOUNTER — Other Ambulatory Visit: Payer: Self-pay

## 2020-11-04 ENCOUNTER — Ambulatory Visit
Admission: RE | Admit: 2020-11-04 | Discharge: 2020-11-04 | Disposition: A | Discharge status: Home / Self Care | Payer: Medicare Other | Source: Ambulatory Visit | Attending: Family Medicine | Admitting: Family Medicine

## 2020-11-04 DIAGNOSIS — Z1231 Encounter for screening mammogram for malignant neoplasm of breast: Secondary | ICD-10-CM | POA: Diagnosis not present

## 2020-11-05 DIAGNOSIS — D1801 Hemangioma of skin and subcutaneous tissue: Secondary | ICD-10-CM | POA: Diagnosis not present

## 2020-11-05 DIAGNOSIS — L218 Other seborrheic dermatitis: Secondary | ICD-10-CM | POA: Diagnosis not present

## 2020-11-05 DIAGNOSIS — L814 Other melanin hyperpigmentation: Secondary | ICD-10-CM | POA: Diagnosis not present

## 2020-11-05 DIAGNOSIS — L820 Inflamed seborrheic keratosis: Secondary | ICD-10-CM | POA: Diagnosis not present

## 2020-11-06 ENCOUNTER — Ambulatory Visit (INDEPENDENT_AMBULATORY_CARE_PROVIDER_SITE_OTHER)
Admission: RE | Admit: 2020-11-06 | Discharge: 2020-11-06 | Disposition: A | Discharge status: Home / Self Care | Payer: Medicare Other | Source: Ambulatory Visit | Attending: Family Medicine | Admitting: Family Medicine

## 2020-11-06 ENCOUNTER — Other Ambulatory Visit: Payer: Self-pay

## 2020-11-06 DIAGNOSIS — E2839 Other primary ovarian failure: Secondary | ICD-10-CM

## 2020-11-08 DIAGNOSIS — E2839 Other primary ovarian failure: Secondary | ICD-10-CM | POA: Diagnosis not present

## 2020-11-12 ENCOUNTER — Encounter: Payer: Self-pay | Admitting: Family Medicine

## 2020-11-14 NOTE — Telephone Encounter (Signed)
Per Dr. Milinda Antis it's okay to schedule pt's daughter as a new pt, please call pt or daughter and schedule new pt appt whenever pt's daughter wants appt

## 2020-12-09 ENCOUNTER — Encounter: Payer: Self-pay | Admitting: Emergency Medicine

## 2020-12-09 ENCOUNTER — Ambulatory Visit
Admission: EM | Admit: 2020-12-09 | Discharge: 2020-12-09 | Disposition: A | Discharge status: Home / Self Care | Payer: Medicare Other | Attending: Family Medicine | Admitting: Family Medicine

## 2020-12-09 DIAGNOSIS — J0140 Acute pansinusitis, unspecified: Secondary | ICD-10-CM | POA: Diagnosis not present

## 2020-12-09 MED ORDER — DOXYCYCLINE HYCLATE 100 MG PO CAPS: 100.0000 mg | ORAL_CAPSULE | Freq: Two times a day (BID) | ORAL | 0 refills | Status: DC

## 2020-12-09 MED ORDER — PROMETHAZINE-DM 6.25-15 MG/5ML PO SYRP: 5.0000 mL | ORAL_SOLUTION | Freq: Three times a day (TID) | ORAL | 0 refills | Status: DC | PRN

## 2020-12-09 NOTE — ED Provider Notes (Signed)
Jamie Garza    CSN: [id number] Arrival date & time: 12/09/20  1609      History   Chief Complaint Chief Complaint  Patient presents with   Cough   Nasal Congestion   Headache    HPI Jamie Garza is a 65 y.o. female.   HPI Patient presents today for evaluation of worsening sinus congestion and facial pressure symptoms.  Patient reports over 10 days ago she developed URI in which she managed with over-the-counter medications and symptoms seem to be improving although over the last few days she has developed pressure on her face along with some intermittent headaches, nasal discharge mixed with blood, and a mild cough has returned.  She is also had postnasal drainage.  Denies any fever or difficulty breathing. Past Medical History:  Diagnosis Date   CAD (coronary artery disease), native coronary artery    coronary CTA with 25-49% prox LAD and coronary Ca score of 242 in 09/2018   Depression with anxiety 10/30/2011   Distal radius fracture, left 05/23/2011   Essential hypertension 06/14/2018   History of radius fracture 05/23/2011   Of note- nl dexa 2013-not a fragility fracture    Hyperlipidemia 09/02/2016   Insomnia    Pulmonary hypertension (HCC)    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 10/2018   SLEEP APNEA 01/04/2007   Qualifier: Diagnosis of  By: Jillyn Hidden FNP, Mcarthur Rossetti    Spinal stenosis, lumbar    L4 and L5 - severe; L3 and L4 - moderate   Stress reaction 07/18/2018   Wrist fracture 11/2019   right     Patient Active Problem List   Diagnosis Date Noted   Estrogen deficiency 10/21/2020   Tinnitus 04/13/2020   Colon cancer screening 11/16/2019   CAD (coronary artery disease), native coronary artery    Pulmonary nodule 06/01/2019   H/O cold sores 01/04/2019   Family history of heart disease 08/05/2018   Stress reaction 07/18/2018   Essential hypertension 06/14/2018   Spinal stenosis 09/02/2016   Hyperlipidemia 09/02/2016   History of radius  fracture 05/23/2011   Encounter for routine gynecological examination 12/19/2010   Screening mammogram, encounter for 12/19/2010   Routine general medical examination at a health care facility 12/15/2010   Insomnia 11/20/2008   Sleep apnea 01/04/2007    Past Surgical History:  Procedure Laterality Date   CARPAL TUNNEL RELEASE Right 12/2018   COLONOSCOPY  08/2009   Jarold Motto  tic's   WRIST FRACTURE SURGERY  2013    OB History   No obstetric history on file.      Home Medications    Prior to Admission medications   Medication Sig Start Date End Date Taking? Authorizing Provider  doxycycline (VIBRAMYCIN) 100 MG capsule Take 1 capsule (100 mg total) by mouth 2 (two) times daily. 12/09/20  Yes Bing Neighbors, FNP  promethazine-dextromethorphan (PROMETHAZINE-DM) 6.25-15 MG/5ML syrup Take 5 mLs by mouth 3 (three) times daily as needed for cough. 12/09/20  Yes Bing Neighbors, FNP  aspirin EC 81 MG tablet Take 1 tablet (81 mg total) by mouth daily. 09/28/18   Quintella Reichert, MD  atorvastatin (LIPITOR) 20 MG tablet Take 1 tablet (20 mg total) by mouth daily. Please make yearly appt with Dr. Mayford Knife for September 2022 for future refills. Thank you 1st attempt 08/02/20   Quintella Reichert, MD  Ca Carbonate-Mag Hydroxide (ROLAIDS EXTRA STRENGTH PO) Take by mouth. Takes for Calcium supplement    [provider]  fluticasone (FLONASE) 50 MCG/ACT nasal spray Place 2 sprays into both nostrils daily. 08/02/19   Tower, Audrie Gallus, MD  losartan (COZAAR) 100 MG tablet Take 1 tablet (100 mg total) by mouth daily. 10/21/20   Tower, Audrie Gallus, MD  LYSINE HCL PO Take 1 tablet by mouth daily.    [provider]  naproxen sodium (ALEVE) 220 MG tablet Take 220 mg by mouth at bedtime as needed.     [provider]  Probiotic Product (PROBIOTIC DAILY PO) Take 1 capsule by mouth 2 (two) times a week.     [provider]  traZODone (DESYREL) 50 MG tablet Take 1 tablet (50 mg  total) by mouth at bedtime. 10/21/20   Tower, Audrie Gallus, MD  TURMERIC PO Take 2,000 mg by mouth daily.    [provider]  valACYclovir (VALTREX) 1000 MG tablet TAKE TWO TABLETS BY MOUTH EVERY TWELVE HOURS FOR ONE DAY FOR FEVER BLISTER/COLD SORE 12/29/19   Tower, Audrie Gallus, MD    Family History Family History  Problem Relation Age of Onset   Diabetes Mother        borderline DM   Aortic stenosis Mother    Diabetes Maternal Aunt    Diabetes Maternal Grandfather    Diabetes Paternal Grandfather    CAD Brother    Aortic stenosis Brother    Breast cancer Neg Hx    Colon cancer Neg Hx    Colon polyps Neg Hx    Esophageal cancer Neg Hx    Rectal cancer Neg Hx    Stomach cancer Neg Hx     Social History Social History   Tobacco Use   Smoking status: Never   Smokeless tobacco: Never  Vaping Use   Vaping Use: Never used  Substance Use Topics   Alcohol use: Yes    Comment: occasional   Drug use: No     Allergies   Patient has no known allergies.   Review of Systems Review of Systems Pertinent negatives listed in HPI   Physical Exam Triage Vital Signs ED Triage Vitals  Enc Vitals Group     BP 12/09/20 1651 (!) 146/68     Pulse Rate 12/09/20 1651 66     Resp 12/09/20 1651 18     Temp 12/09/20 1651 98.3 F (36.8 C)     Temp Source 12/09/20 1651 Oral     SpO2 12/09/20 1651 98 %     Weight --      Height --      Head Circumference --      Peak Flow --      Pain Score 12/09/20 1653 5     Pain Loc --      Pain Edu? --      Excl. in GC? --    No data found.  Updated Vital Signs BP (!) 146/68   Pulse 66   Temp 98.3 F (36.8 C) (Oral)   Resp 18   SpO2 98%   Visual Acuity Right Eye Distance:   Left Eye Distance:   Bilateral Distance:    Right Eye Near:   Left Eye Near:    Bilateral Near:     Physical Exam  General Appearance:    Alert, cooperative, no distress  HENT:   Normocephalic, ears normal, nares mucosal edema with congestion, rhinorrhea,  oropharynx clear   Eyes:    PERRL, conjunctiva/corneas clear, EOM's intact       Lungs:  Clear to auscultation bilaterally, respirations unlabored  Heart:    Regular rate and rhythm  Neurologic:   Awake, alert, oriented x 3. No apparent focal neurological           defect.     UC Treatments / Results  Labs (all labs ordered are listed, but only abnormal results are displayed) Labs Reviewed - No data to display  EKG   Radiology No results found.  Procedures Procedures (including critical care time)  Medications Ordered in UC Medications - No data to display  Initial Impression / Assessment and Plan / UC Course  I have reviewed the triage vital signs and the nursing notes.  Pertinent labs & imaging results that were available during my care of the patient were reviewed by me and considered in my medical decision making (see chart for details).    Acute nonrecurrent sinusitis Treatment with doxycycline twice daily for 10 days Promethazine DM for cough and nasal congestion Return if symptoms worsen or do not improve. Final Clinical Impressions(s) / UC Diagnoses   Final diagnoses:  Acute non-recurrent pansinusitis   Discharge Instructions   None    ED Prescriptions     Medication Sig Dispense Auth. Provider   promethazine-dextromethorphan (PROMETHAZINE-DM) 6.25-15 MG/5ML syrup Take 5 mLs by mouth 3 (three) times daily as needed for cough. 140 mL Bing Neighbors, FNP   doxycycline (VIBRAMYCIN) 100 MG capsule Take 1 capsule (100 mg total) by mouth 2 (two) times daily. 20 capsule Bing Neighbors, FNP      PDMP not reviewed this encounter.   Bing Neighbors, FNP 12/09/20 [phone]

## 2020-12-09 NOTE — ED Triage Notes (Signed)
Pt here with sinus pressure and congestion for 10 days following a URI.

## 2020-12-23 DIAGNOSIS — H524 Presbyopia: Secondary | ICD-10-CM | POA: Diagnosis not present

## 2020-12-23 DIAGNOSIS — Z961 Presence of intraocular lens: Secondary | ICD-10-CM | POA: Diagnosis not present

## 2021-01-07 ENCOUNTER — Encounter: Payer: Self-pay | Admitting: Family Medicine

## 2021-01-07 MED ORDER — VALACYCLOVIR HCL 1 G PO TABS
ORAL_TABLET | ORAL | 11 refills | Status: DC
Start: 2021-01-07 — End: 2022-01-30

## 2021-01-23 ENCOUNTER — Other Ambulatory Visit: Payer: Self-pay | Admitting: Cardiology

## 2021-02-03 ENCOUNTER — Other Ambulatory Visit: Payer: Self-pay | Admitting: Cardiology

## 2021-03-24 ENCOUNTER — Encounter: Payer: Self-pay | Admitting: Family Medicine

## 2021-03-25 ENCOUNTER — Other Ambulatory Visit: Payer: Self-pay

## 2021-03-25 ENCOUNTER — Telehealth (INDEPENDENT_AMBULATORY_CARE_PROVIDER_SITE_OTHER): Payer: Medicare Other | Admitting: Family Medicine

## 2021-03-25 ENCOUNTER — Encounter: Payer: Self-pay | Admitting: Family Medicine

## 2021-03-25 DIAGNOSIS — J069 Acute upper respiratory infection, unspecified: Secondary | ICD-10-CM | POA: Insufficient documentation

## 2021-03-25 MED ORDER — BENZONATATE 200 MG PO CAPS: 200.0000 mg | ORAL_CAPSULE | Freq: Three times a day (TID) | ORAL | 1 refills | Status: DC | PRN

## 2021-03-25 NOTE — Assessment & Plan Note (Signed)
Mild, day 3-4 caught from her husband   ?Sounds viral/reassuring history  ?Will do a covid test when she gets home today  ?Discussed symptom care  ?Fluids/rest and delsym ?Sent in tessalon for cough  ?If wheezing-would consider prednisone  ?Update if not starting to improve in a week or if worsening  ?ER precautions discussed  ?

## 2021-03-25 NOTE — Patient Instructions (Signed)
Drink fluids and rest  ?Delsym is good for cough ?You can try the tessalon pearles also  ?Nasal saline for congestion as needed  ?Tylenol for fever or pain or headache  ?Please alert [place] if symptoms worsen (if severe or short of breath please go to the ER)  ? ?Update if not starting to improve in a week or if worsening   ? ?

## 2021-03-25 NOTE — Progress Notes (Signed)
Virtual Visit via Video Note ? ?I connected with Jamie Garza on 03/25/21 at  4:00 PM EST by a video enabled telemedicine application and verified that I am speaking with the correct person using two identifiers. ? ?Location: ?Patient: home ?Provider: office  ?  ?I discussed the limitations of evaluation and management by telemedicine and the availability of in person appointments. The patient expressed understanding and agreed to proceed. ? ?Parties involved in encounter ? ?Patient: Jamie Garza  ? ?Provider:  Roxy Manns MD  ? ?History of Present Illness: ?Pt presents for cough and congestion  ? ? ?Last week husband had a bad cold/pretty sick ? ?Symptoms started with cough on Friday  ?It got worse  ?Coughing up just a little bit / phlegm is light yellow  ?Throat is irritated ?Clears her throat  ?Ears hurt off and on  ?Congestion - little to no  ?Little mild headache yesterday- better now  ? ?No sinus pain or pressure  ?No fever  ? ?Has not done a covid test   ? ?No wheezing  ? ?Working  ?Tax season  ? ?Otc :  ?Took left over cough medicine from a past sinus infection - prometh dm  ? ?Aleve  ? ?Is immunized for covid  ? ?  ?Patient Active Problem List  ? Diagnosis Date Noted  ? Estrogen deficiency 10/21/2020  ? Tinnitus 04/13/2020  ? Colon cancer screening 11/16/2019  ? CAD (coronary artery disease), native coronary artery   ? Pulmonary nodule 06/01/2019  ? H/O cold sores 01/04/2019  ? Family history of heart disease 08/05/2018  ? Stress reaction 07/18/2018  ? Essential hypertension 06/14/2018  ? Spinal stenosis 09/02/2016  ? Hyperlipidemia 09/02/2016  ? History of radius fracture 05/23/2011  ? Encounter for routine gynecological examination 12/19/2010  ? Screening mammogram, encounter for 12/19/2010  ? Routine general medical examination at a health care facility 12/15/2010  ? Insomnia 11/20/2008  ? Sleep apnea 01/04/2007  ? ?Past Medical History:  ?Diagnosis Date  ? CAD (coronary artery disease), native coronary  artery   ? coronary CTA with 25-49% prox LAD and coronary Ca score of 242 in 09/2018  ? Depression with anxiety 10/30/2011  ? Distal radius fracture, left 05/23/2011  ? Essential hypertension 06/14/2018  ? History of radius fracture 05/23/2011  ? Of note- nl dexa 2013-not a fragility fracture   ? Hyperlipidemia 09/02/2016  ? Insomnia   ? Pulmonary hypertension (HCC)   ? 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 10/2018  ? SLEEP APNEA 01/04/2007  ? Qualifier: Diagnosis of  By: Jillyn Hidden FNP, Mcarthur Rossetti   ? Spinal stenosis, lumbar   ? L4 and L5 - severe; L3 and L4 - moderate  ? Stress reaction 07/18/2018  ? Wrist fracture 11/2019  ? right   ? ?Past Surgical History:  ?Procedure Laterality Date  ? CARPAL TUNNEL RELEASE Right 12/2018  ? COLONOSCOPY  08/2009  ? Jarold Motto  tic's  ? WRIST FRACTURE SURGERY  2013  ? ?Social History  ? ?Tobacco Use  ? Smoking status: Never  ? Smokeless tobacco: Never  ?Vaping Use  ? Vaping Use: Never used  ?Substance Use Topics  ? Alcohol use: Yes  ?  Comment: occasional  ? Drug use: No  ? ?Family History  ?Problem Relation Age of Onset  ? Diabetes Mother   ?     borderline DM  ? Aortic stenosis Mother   ? Diabetes Maternal Aunt   ? Diabetes Maternal Grandfather   ?  Diabetes Paternal Grandfather   ? CAD Brother   ? Aortic stenosis Brother   ? Breast cancer Neg Hx   ? Colon cancer Neg Hx   ? Colon polyps Neg Hx   ? Esophageal cancer Neg Hx   ? Rectal cancer Neg Hx   ? Stomach cancer Neg Hx   ? ?No Known Allergies ?Current Outpatient Medications on File Prior to Visit  ?Medication Sig Dispense Refill  ? aspirin EC 81 MG tablet Take 1 tablet (81 mg total) by mouth daily. 90 tablet 3  ? atorvastatin (LIPITOR) 20 MG tablet Take 1 tablet (20 mg total) by mouth daily. 90 tablet 0  ? Ca Carbonate-Mag Hydroxide (ROLAIDS EXTRA STRENGTH PO) Take by mouth. Takes for Calcium supplement    ? fluticasone (FLONASE) 50 MCG/ACT nasal spray Place 2 sprays into both nostrils daily. 16 g 6  ? losartan (COZAAR) 100  MG tablet Take 1 tablet (100 mg total) by mouth daily. 90 tablet 3  ? LYSINE HCL PO Take 1 tablet by mouth daily.    ? naproxen sodium (ALEVE) 220 MG tablet Take 220 mg by mouth at bedtime as needed.     ? Probiotic Product (PROBIOTIC DAILY PO) Take 1 capsule by mouth 2 (two) times a week.     ? traZODone (DESYREL) 50 MG tablet Take 1 tablet (50 mg total) by mouth at bedtime. 90 tablet 3  ? TURMERIC PO Take 2,000 mg by mouth daily.    ? valACYclovir (VALTREX) 1000 MG tablet TAKE TWO TABLETS BY MOUTH EVERY TWELVE HOURS FOR ONE DAY FOR FEVER BLISTER/COLD SORE 4 tablet 11  ? ?No current facility-administered medications on file prior to visit.  ? Review of Systems  ?Constitutional:  Positive for malaise/fatigue. Negative for chills and fever.  ?HENT:  Negative for congestion, ear pain, sinus pain and sore throat.   ?     Congestion is very mild   ?Eyes:  Negative for blurred vision, discharge and redness.  ?Respiratory:  Positive for cough and sputum production. Negative for shortness of breath, wheezing and stridor.   ?Cardiovascular:  Negative for chest pain, palpitations and leg swelling.  ?Gastrointestinal:  Negative for abdominal pain, diarrhea, nausea and vomiting.  ?Musculoskeletal:  Negative for myalgias.  ?Skin:  Negative for rash.  ?Neurological:  Positive for headaches. Negative for dizziness.   ?Observations/Objective: ?Patient appears well, in no distress ?Weight is baseline  ?No facial swelling or asymmetry ?Occasionally sniffling ?Normal voice-not hoarse and no slurred speech ?No obvious tremor or mobility impairment ?Moving neck and UEs normally ?Able to hear the call well  ?No wheeze or shortness of breath during interview  ?Occasional croupy sounding cough  ?Talkative and mentally sharp with no cognitive changes ?No skin changes on face or neck , no rash or pallor ?Affect is normal  ? ? ?Assessment and Plan: ?Problem List Items Addressed This Visit   ? ?  ? Respiratory  ? Viral URI with cough  ?   Mild, day 3-4 caught from her husband   ?Sounds viral/reassuring history  ?Will do a covid test when she gets home today  ?Discussed symptom care  ?Fluids/rest and delsym ?Sent in tessalon for cough  ?If wheezing-would consider prednisone  ?Update if not starting to improve in a week or if worsening  ?ER precautions discussed  ?  ?  ? ? ? ?Follow Up Instructions: ?Drink fluids and rest  ?Delsym is good for cough ?You can try the tessalon pearles also  ?  Nasal saline for congestion as needed  ?Tylenol for fever or pain or headache  ?Please alert [place] if symptoms worsen (if severe or short of breath please go to the ER)  ? ?Update if not starting to improve in a week or if worsening   ?  ?I discussed the assessment and treatment plan with the patient. The patient was provided an opportunity to ask questions and all were answered. The patient agreed with the plan and demonstrated an understanding of the instructions. ?  ?The patient was advised to call back or seek an in-person evaluation if the symptoms worsen or if the condition fails to improve as anticipated. ? ? ? ?Roxy Manns, MD ? ?

## 2021-05-01 ENCOUNTER — Other Ambulatory Visit: Payer: Self-pay | Admitting: Cardiology

## 2021-05-12 ENCOUNTER — Encounter: Payer: Self-pay | Admitting: Cardiology

## 2021-05-12 ENCOUNTER — Ambulatory Visit: Payer: Medicare Other | Admitting: Cardiology

## 2021-05-12 VITALS — BP 124/76 | HR 52 | Ht 62.0 in | Wt 139.6 lb

## 2021-05-12 DIAGNOSIS — I2510 Atherosclerotic heart disease of native coronary artery without angina pectoris: Secondary | ICD-10-CM

## 2021-05-12 DIAGNOSIS — E7800 Pure hypercholesterolemia, unspecified: Secondary | ICD-10-CM

## 2021-05-12 DIAGNOSIS — I10 Essential (primary) hypertension: Secondary | ICD-10-CM | POA: Diagnosis not present

## 2021-05-12 MED ORDER — ATORVASTATIN CALCIUM 20 MG PO TABS: 20.0000 mg | ORAL_TABLET | Freq: Every day | ORAL | 3 refills | Status: DC

## 2021-05-12 MED ORDER — LOSARTAN POTASSIUM 100 MG PO TABS: 100.0000 mg | ORAL_TABLET | Freq: Every day | ORAL | 3 refills | Status: DC

## 2021-05-12 NOTE — Patient Instructions (Signed)
Medication Instructions:  ?Your physician recommends that you continue on your current medications as directed. Please refer to the Current Medication list given to you today. ? ?*If you need a refill on your cardiac medications before your next appointment, please call your pharmacy* ? ? ?Lab Work: ?TODAY: FLP and ALT ?If you have labs (blood work) drawn today and your tests are completely normal, you will receive your results only by: ?MyChart Message (if you have MyChart) OR ?A paper copy in the mail ?If you have any lab test that is abnormal or we need to change your treatment, we will call you to review the results. ? ?Follow-Up: ?At [facility], you and your health needs are our priority.  As part of our continuing mission to provide you with exceptional heart care, we have created designated Provider Care Teams.  These Care Teams include your primary Cardiologist (physician) and Advanced Practice Providers (APPs -  Physician Assistants and Nurse Practitioners) who all work together to provide you with the care you need, when you need it.  ? ?Your next appointment:   ?1 year(s) ? ?The format for your next appointment:   ?In Person ? ?Provider:   ?Armanda Magic, MD   ? ? ?Important Information About Sugar ? ? ? ? ?  ?

## 2021-05-12 NOTE — Addendum Note (Signed)
Addended by: Theresia Majors on: 05/12/2021 08:55 AM ? ? Modules accepted: Orders ? ?

## 2021-05-12 NOTE — Progress Notes (Signed)
?Date:  05/12/2021  ? ?ID:  Jamie Garza, DOB August 21, 1955, MRN [id number] ? ? ?PCP:  Tower, Audrie Gallus, MD  ?Cardiology:  Armanda Magic, MD ?Electrophysiologist:  None  ? ?Chief Complaint:  HTN and fm hx of CAD ? ?History of Present Illness:   ? ?Jamie Garza is a 66 y.o. female with a hx of HTN, HLD, fm hx of CAD and hx of OSA.  She has never smoked. When I last saw her she had initially presented for evaluation due to her brother having CAD as well as AS and her mother had aortic valve problems.  She had complained of intermittent chest pressure gut thought it was related to stress.  She underwent coronary CTA a year ago and showed a coronary Ca score of 242 and mild non-obstructive CAD with 25-49% prox LAD.  Coronary FFR was normal.  She was started on statin and ASA.   ? ?She is here today for followup and is doing well.  She denies any chest pain or pressure, SOB, DOE, PND, orthopnea, LE edema, .0 palpitations or syncope. She is compliant with her meds and is tolerating meds with no SE.   ? ?Prior CV studies:   ?The following studies were reviewed today: ? ?EKG ? ?Past Medical History:  ?Diagnosis Date  ? CAD (coronary artery disease), native coronary artery   ? coronary CTA with 25-49% prox LAD and coronary Ca score of 242 in 09/2018  ? Depression with anxiety 10/30/2011  ? Distal radius fracture, left 05/23/2011  ? Essential hypertension 06/14/2018  ? History of radius fracture 05/23/2011  ? Of note- nl dexa 2013-not a fragility fracture   ? Hyperlipidemia 09/02/2016  ? Insomnia   ? Pulmonary hypertension (HCC)   ? 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 10/2018  ? SLEEP APNEA 01/04/2007  ? Qualifier: Diagnosis of  By: Jillyn Hidden FNP, Mcarthur Rossetti   ? Spinal stenosis, lumbar   ? L4 and L5 - severe; L3 and L4 - moderate  ? Stress reaction 07/18/2018  ? Wrist fracture 11/2019  ? right   ? ?Past Surgical History:  ?Procedure Laterality Date  ? CARPAL TUNNEL RELEASE Right 12/2018  ? COLONOSCOPY  08/2009  ? Jarold Motto   tic's  ? WRIST FRACTURE SURGERY  2013  ?  ? ?No outpatient medications have been marked as taking for the 05/12/21 encounter (Appointment) with Quintella Reichert, MD.  ?  ? ?Allergies:   Patient has no known allergies.  ? ?Social History  ? ?Tobacco Use  ? Smoking status: Never  ? Smokeless tobacco: Never  ?Vaping Use  ? Vaping Use: Never used  ?Substance Use Topics  ? Alcohol use: Yes  ?  Comment: occasional  ? Drug use: No  ?  ? ?Family Hx: ?The patient's family history includes Aortic stenosis in her brother and mother; CAD in her brother; Diabetes in her maternal aunt, maternal grandfather, mother, and paternal grandfather. There is no history of Breast cancer, Colon cancer, Colon polyps, Esophageal cancer, Rectal cancer, or Stomach cancer. ? ?ROS:   ?Please see the history of present illness.    ? ?All other systems reviewed and are negative. ? ? ?Labs/Other Tests and Data Reviewed:   ? ?Recent Labs: ?10/21/2020: ALT 15; BUN 14; Creatinine, Ser 0.64; Hemoglobin 12.8; Platelets 205.0; Potassium 4.3; Sodium 141; TSH 1.92  ? ?Recent Lipid Panel ?Lab Results  ?Component Value Date/Time  ? CHOL 141 10/21/2020 09:30 AM  ? CHOL 138  11/04/2018 07:35 AM  ? TRIG 101.0 10/21/2020 09:30 AM  ? HDL 56.00 10/21/2020 09:30 AM  ? HDL 63 11/04/2018 07:35 AM  ? CHOLHDL 3 10/21/2020 09:30 AM  ? LDLCALC 64 10/21/2020 09:30 AM  ? LDLCALC 62 11/04/2018 07:35 AM  ? LDLDIRECT 137.8 12/03/2009 08:54 AM  ? ? ?Wt Readings from Last 3 Encounters:  ?10/21/20 141 lb (64 kg)  ?06/26/20 145 lb (65.8 kg)  ?06/12/20 145 lb (65.8 kg)  ?  ? ?Objective:   ? ?Vital Signs:  There were no vitals taken for this visit.  ? ?GEN: Well nourished, well developed in no acute distress ?HEENT: Normal ?NECK: No JVD; No carotid bruits ?LYMPHATICS: No lymphadenopathy ?CARDIAC:RRR, no murmurs, rubs, gallops ?RESPIRATORY:  Clear to auscultation without rales, wheezing or rhonchi  ?ABDOMEN: Soft, non-tender, non-distended ?MUSCULOSKELETAL:  No edema; No deformity   ?SKIN: Warm and dry ?NEUROLOGIC:  Alert and oriented x 3 ?PSYCHIATRIC:  Normal affect  ? ?EKG was performed today and demonstrates sinus bradycardia at 52bpm with iRBB ? ? ?ASSESSMENT & PLAN:   ? ?1.  ASCAD ?-nonobstructive with 25-49% prox LAD on coronary CTA 09/2018 ?-She denies any anginal symptoms ?-Continue aspirin 81 mg daily and statin therapy ? ?2.  Hypertension  ?-BP is well controlled on exam today ?-Continue prescription drug management with losartan 100 mg daily with as needed refills ?-I have personally reviewed and interpreted outside labs serum creatinine performed by patient's PCP which showed serum creatinine 0.64 potassium 4.3 on 10/21/2020 ? ?3.  Hyperlipidemia  ?-LDL goal < 70 ?-I have personally reviewed and interpreted outside labs performed by patient's PCP which showed LDL 64, HDL 56 on 10/21/2020  ?-Continue prescription drug management with atorvastatin 20 mg daily with as needed refills ? ? ?Medication Adjustments/Labs and Tests Ordered: ?Current medicines are reviewed at length with the patient today.  Concerns regarding medicines are outlined above.  ?Tests Ordered: ?No orders of the defined types were placed in this encounter. ? ?Medication Changes: ?No orders of the defined types were placed in this encounter. ? ? ?Disposition:  Follow up 1 year ? ?Signed, ?Armanda Magic, MD  ?05/12/2021 8:05 AM    ? [facility] ?

## 2021-05-13 ENCOUNTER — Telehealth: Payer: Self-pay

## 2021-05-13 ENCOUNTER — Telehealth: Payer: Self-pay | Admitting: Cardiology

## 2021-05-13 ENCOUNTER — Encounter: Payer: Self-pay | Admitting: Cardiology

## 2021-05-13 DIAGNOSIS — E7800 Pure hypercholesterolemia, unspecified: Secondary | ICD-10-CM

## 2021-05-13 LAB — LIPID PANEL
Chol/HDL Ratio: 2.7 ratio (ref 0.0–4.4)
Cholesterol, Total: 156 mg/dL (ref 100–199)
HDL: 57 mg/dL (ref >39)
LDL Chol Calc (NIH): 80 mg/dL (ref 0–99)
Triglycerides: 103 mg/dL (ref 0–149)
VLDL Cholesterol Cal: 19 mg/dL (ref 5–40)

## 2021-05-13 LAB — ALT: ALT: 21 IU/L (ref 0–32)

## 2021-05-13 MED ORDER — ATORVASTATIN CALCIUM 40 MG PO TABS: 40.0000 mg | ORAL_TABLET | Freq: Every day | ORAL | 3 refills | Status: DC

## 2021-05-13 NOTE — Telephone Encounter (Signed)
?  Pt is returning call to get result ?

## 2021-05-13 NOTE — Telephone Encounter (Signed)
See above telephone note 

## 2021-05-13 NOTE — Telephone Encounter (Signed)
Spoke with the patient and answered her questions in regards to her lab results. I have scheduled her for repeat labs.  ?

## 2021-05-13 NOTE — Telephone Encounter (Signed)
The patient has been notified of the result and verbalized understanding.  All questions (if any) were answered. ?Theresia Majors, RN 05/13/2021 1:25 PM  ?Increased dose of atorvastatin has been sent in. Patient will send MyChart message with date for lab work.  ?

## 2021-05-13 NOTE — Telephone Encounter (Signed)
-----   Message from Quintella Reichert, MD sent at 05/13/2021  8:56 AM EDT ----- ?LDL is not at goal please increase atorvastatin to 40 mg daily and repeat FLP and ALT in 6 weeks ?

## 2021-05-27 ENCOUNTER — Ambulatory Visit (INDEPENDENT_AMBULATORY_CARE_PROVIDER_SITE_OTHER): Payer: Medicare Other

## 2021-05-27 VITALS — Ht 62.0 in | Wt 139.0 lb

## 2021-05-27 DIAGNOSIS — Z0000 Encounter for general adult medical examination without abnormal findings: Secondary | ICD-10-CM | POA: Diagnosis not present

## 2021-05-27 NOTE — Progress Notes (Signed)
? ?Subjective:  ? Jamie Garza is a 66 y.o. female who presents for Medicare Annual (Subsequent) preventive examination. ? ?Review of Systems    ?Virtual Visit via Telephone Note ? ?I connected with  Jamie Garza on 05/27/21 at 10:30 AM EDT by telephone and verified that I am speaking with the correct person using two identifiers. ? ?Location: ?Patient: Home ?Provider: Office ?Persons participating in the virtual visit: patient/Nurse Health Advisor ?  ?I discussed the limitations, risks, security and privacy concerns of performing an evaluation and management service by telephone and the availability of in person appointments. The patient expressed understanding and agreed to proceed. ? ?Interactive audio and video telecommunications were attempted between this nurse and patient, however failed, due to patient having technical difficulties OR patient did not have access to video capability.  We continued and completed visit with audio only. ? ?Some vital signs may be absent or patient reported.  ? ?Criselda Peaches, LPN  ?Cardiac Risk Factors include: advanced age (>39men, >57 women);hypertension ? ?   ?Objective:  ?  ?Today's Vitals  ? 05/27/21 1032  ?Weight: 139 lb (63 kg)  ?Height: 5\' 2"  (1.575 m)  ? ?Body mass index is 25.42 kg/m?. ? ? ?  05/27/2021  ? 10:42 AM 05/23/2011  ?  7:43 AM  ?Advanced Directives  ?Does Patient Have a Medical Advance Directive? Yes Patient would not like information  ?Type of Paramedic of [place];Living will   ?Does patient want to make changes to medical advance directive? No - Patient declined   ?Copy of [place] in Chart? No - copy requested   ? ? ?Current Medications (verified) ?Outpatient Encounter Medications as of 05/27/2021  ?Medication Sig  ? aspirin EC 81 MG tablet Take 1 tablet (81 mg total) by mouth daily.  ? atorvastatin (LIPITOR) 40 MG tablet Take 1 tablet (40 mg total) by mouth daily.  ? benzonatate (TESSALON) 200 MG capsule Take  1 capsule (200 mg total) by mouth 3 (three) times daily as needed for cough. Swallow whole  ? Ca Carbonate-Mag Hydroxide (ROLAIDS EXTRA STRENGTH PO) Take by mouth. Takes for Calcium supplement  ? fluticasone (FLONASE) 50 MCG/ACT nasal spray Place 2 sprays into both nostrils daily.  ? losartan (COZAAR) 100 MG tablet Take 1 tablet (100 mg total) by mouth daily.  ? LYSINE HCL PO Take 1 tablet by mouth daily.  ? naproxen sodium (ALEVE) 220 MG tablet Take 220 mg by mouth at bedtime as needed.   ? Probiotic Product (PROBIOTIC DAILY PO) Take 1 capsule by mouth 2 (two) times a week.   ? traZODone (DESYREL) 50 MG tablet Take 1 tablet (50 mg total) by mouth at bedtime.  ? TURMERIC PO Take 2,000 mg by mouth daily.  ? valACYclovir (VALTREX) 1000 MG tablet TAKE TWO TABLETS BY MOUTH EVERY TWELVE HOURS FOR ONE DAY FOR FEVER BLISTER/COLD SORE  ? ?No facility-administered encounter medications on file as of 05/27/2021.  ? ? ?Allergies (verified) ?Patient has no known allergies.  ? ?History: ?Past Medical History:  ?Diagnosis Date  ? CAD (coronary artery disease), native coronary artery   ? coronary CTA with 25-49% prox LAD and coronary Ca score of 242 in 09/2018  ? Depression with anxiety 10/30/2011  ? Distal radius fracture, left 05/23/2011  ? Essential hypertension 06/14/2018  ? History of radius fracture 05/23/2011  ? Of note- nl dexa 2013-not a fragility fracture   ? Hyperlipidemia 09/02/2016  ? Insomnia   ?  Pulmonary hypertension ([place])   ? PASP 55mmHg with mild to moderate MR by echo 10/2018  ? SLEEP APNEA 01/04/2007  ? Qualifier: Diagnosis of  By: Maxie Better FNP, Rosalita Levan   ? Spinal stenosis, lumbar   ? L4 and L5 - severe; L3 and L4 - moderate  ? Stress reaction 07/18/2018  ? Wrist fracture 11/2019  ? right   ? ?Past Surgical History:  ?Procedure Laterality Date  ? CARPAL TUNNEL RELEASE Right 12/2018  ? COLONOSCOPY  08/2009  ? Sharlett Iles  tic's  ? WRIST FRACTURE SURGERY  2013  ? ?Family History  ?Problem Relation Age of Onset  ?  Diabetes Mother   ?     borderline DM  ? Aortic stenosis Mother   ? Diabetes Maternal Aunt   ? Diabetes Maternal Grandfather   ? Diabetes Paternal Grandfather   ? CAD Brother   ? Aortic stenosis Brother   ? Breast cancer Neg Hx   ? Colon cancer Neg Hx   ? Colon polyps Neg Hx   ? Esophageal cancer Neg Hx   ? Rectal cancer Neg Hx   ? Stomach cancer Neg Hx   ? ?Social History  ? ?Socioeconomic History  ? Marital status: Married  ?  Spouse name: Not on file  ? Number of children: Not on file  ? Years of education: Not on file  ? Highest education level: Not on file  ?Occupational History  ? Not on file  ?Tobacco Use  ? Smoking status: Never  ? Smokeless tobacco: Never  ?Vaping Use  ? Vaping Use: Never used  ?Substance and Sexual Activity  ? Alcohol use: Yes  ?  Comment: occasional  ? Drug use: No  ? Sexual activity: Not on file  ?Other Topics Concern  ? Not on file  ?Social History Narrative  ? ** Merged History Encounter **  ?    ? ?Social Determinants of Health  ? ?Financial Resource Strain: Low Risk   ? Difficulty of Paying Living Expenses: Not hard at all  ?Food Insecurity: No Food Insecurity  ? Worried About Charity fundraiser in the Last Year: Never true  ? Ran Out of Food in the Last Year: Never true  ?Transportation Needs: No Transportation Needs  ? Lack of Transportation (Medical): No  ? Lack of Transportation (Non-Medical): No  ?Physical Activity: Sufficiently Active  ? Days of Exercise per Week: 5 days  ? Minutes of Exercise per Session: 30 min  ?Stress: No Stress Concern Present  ? Feeling of Stress : Not at all  ?Social Connections: Socially Integrated  ? Frequency of Communication with Friends and Family: More than three times a week  ? Frequency of Social Gatherings with Friends and Family: More than three times a week  ? Attends Religious Services: More than 4 times per year  ? Active Member of Clubs or Organizations: Yes  ? Attends Archivist Meetings: More than 4 times per year  ? Marital  Status: Married  ? ? ?Clinical Intake: ?How often do you need to have someone help you when you read instructions, pamphlets, or other written materials from your doctor or pharmacy?: 1 - Never ? ?Diabetic?   No ? ? ?Activities of Daily Living ? ?  05/27/2021  ? 10:39 AM 10/21/2020  ?  8:35 AM  ?In your present state of health, do you have any difficulty performing the following activities:  ?Hearing? 0 0  ?Vision? 0 0  ?Difficulty concentrating or  making decisions? 0 0  ?Walking or climbing stairs? 0 0  ?Dressing or bathing? 0 0  ?Doing errands, shopping? 0 0  ?Preparing Food and eating ? N   ?Using the Toilet? N   ?In the past six months, have you accidently leaked urine? N   ?Do you have problems with loss of bowel control? N   ?Managing your Medications? N   ?Managing your Finances? N   ?Housekeeping or managing your Housekeeping? N   ? ? ?Patient Care Team: ?Tower, Wynelle Fanny, MD as PCP - General ?Sueanne Margarita, MD as PCP - Cardiology (Cardiology) ? ?Indicate any recent Medical Services you may have received from other than [facility] providers in the past year (date may be approximate). ? ?   ?Assessment:  ? This is a routine wellness examination for Jamie Garza. ? ?Hearing/Vision screen ?Hearing Screening - Comments:: No hearing difficulty ?Vision Screening - Comments:: Wears contacts and glasses. Followed by Dr Gloriann Loan ? ?Dietary issues and exercise activities discussed: ?Exercise limited by: None identified ? ? Goals Addressed   ? ?  ?  ?  ?  ?  ? This Visit's Progress  ?   Maintain physical activity (pt-stated)     ?   I would  like to lose about 5-10lbs ?  ? ?  ? ?Depression Screen ? ?  05/27/2021  ? 10:37 AM 10/21/2020  ?  8:35 AM 09/11/2019  ?  9:51 AM 09/09/2018  ?  3:19 PM 06/14/2018  ? 10:45 AM 09/02/2016  ?  7:20 PM  ?PHQ 2/9 Scores  ?PHQ - 2 Score 0 0 0 0 0 0  ?PHQ- 9 Score   0     ?  ?Fall Risk ? ?  05/27/2021  ? 10:40 AM  ?Fall Risk   ?Falls in the past year? 0  ?Number falls in past yr: 0  ?Injury with Fall? 0  ?Risk for  fall due to : No Fall Risks  ? ? ?FALL RISK PREVENTION PERTAINING TO THE HOME: ? ?Any stairs in or around the home? Yes  ?If so, are there any without handrails? No  ?Home free of loose throw rugs in walkwa

## 2021-05-27 NOTE — Patient Instructions (Addendum)
?Ms. Joanne GavelSutton , ?Thank you for taking time to come for your Medicare Wellness Visit. I appreciate your ongoing commitment to your health goals. Please review the following plan we discussed and let me know if I can assist you in the future.  ? ?These are the goals we discussed: ? Goals   ? ?   Maintain physical activity (pt-stated)   ?   I would  like to lose about 5-10lbs ?  ? ?  ?  ?This is a list of the screening recommended for you and due dates:  ?Health Maintenance  ?Topic Date Due  ? COVID-19 Vaccine (4 - Booster for Pfizer series) 06/12/2021*  ? Hepatitis C Screening: USPSTF Recommendation to screen - Ages 18-79 yo.  03/16/2023*  ? Flu Shot  08/19/2021  ? Mammogram  11/04/2021  ? Tetanus Vaccine  10/31/2028  ? Colon Cancer Screening  06/27/2030  ? Pneumonia Vaccine  Completed  ? DEXA scan (bone density measurement)  Completed  ? Zoster (Shingles) Vaccine  Completed  ? HPV Vaccine  Aged Out  ?*Topic was postponed. The date shown is not the original due date.  ? ?Advanced directives: Yes Patient will submit copy ? ?Conditions/risks identified: None ? ?Next appointment: Follow up in one year for your annual wellness visit  ? ? ?Preventive Care 6765 Years and Older, Female ?Preventive care refers to lifestyle choices and visits with your health care provider that can promote health and wellness. ?What does preventive care include? ?A yearly physical exam. This is also called an annual well check. ?Dental exams once or twice a year. ?Routine eye exams. Ask your health care provider how often you should have your eyes checked. ?Personal lifestyle choices, including: ?Daily care of your teeth and gums. ?Regular physical activity. ?Eating a healthy diet. ?Avoiding tobacco and drug use. ?Limiting alcohol use. ?Practicing safe sex. ?Taking low-dose aspirin every day. ?Taking vitamin and mineral supplements as recommended by your health care provider. ?What happens during an annual well check? ?The services and screenings  done by your health care provider during your annual well check will depend on your age, overall health, lifestyle risk factors, and family history of disease. ?Counseling  ?Your health care provider may ask you questions about your: ?Alcohol use. ?Tobacco use. ?Drug use. ?Emotional well-being. ?Home and relationship well-being. ?Sexual activity. ?Eating habits. ?History of falls. ?Memory and ability to understand (cognition). ?Work and work Astronomerenvironment. ?Reproductive health. ?Screening  ?You may have the following tests or measurements: ?Height, weight, and BMI. ?Blood pressure. ?Lipid and cholesterol levels. These may be checked every 5 years, or more frequently if you are over 67 years old. ?Skin check. ?Lung cancer screening. You may have this screening every year starting at age 67 if you have a 30-pack-year history of smoking and currently smoke or have quit within the past 15 years. ?Fecal occult blood test (FOBT) of the stool. You may have this test every year starting at age 67. ?Flexible sigmoidoscopy or colonoscopy. You may have a sigmoidoscopy every 5 years or a colonoscopy every 10 years starting at age 67. ?Hepatitis C blood test. ?Hepatitis B blood test. ?Sexually transmitted disease (STD) testing. ?Diabetes screening. This is done by checking your blood sugar (glucose) after you have not eaten for a while (fasting). You may have this done every 1-3 years. ?Bone density scan. This is done to screen for osteoporosis. You may have this done starting at age 67. ?Mammogram. This may be done every 1-2 years. Talk to  your health care provider about how often you should have regular mammograms. ?Talk with your health care provider about your test results, treatment options, and if necessary, the need for more tests. ?Vaccines  ?Your health care provider may recommend certain vaccines, such as: ?Influenza vaccine. This is recommended every year. ?Tetanus, diphtheria, and acellular pertussis (Tdap, Td)  vaccine. You may need a Td booster every 10 years. ?Zoster vaccine. You may need this after age 76. ?Pneumococcal 13-valent conjugate (PCV13) vaccine. One dose is recommended after age 47. ?Pneumococcal polysaccharide (PPSV23) vaccine. One dose is recommended after age 61. ?Talk to your health care provider about which screenings and vaccines you need and how often you need them. ?This information is not intended to replace advice given to you by your health care provider. Make sure you discuss any questions you have with your health care provider. ?Document Released: 02/01/2015 Document Revised: 09/25/2015 Document Reviewed: 11/06/2014 ?Elsevier Interactive Patient Education ? 2017 [employer] ? ?Fall Prevention in the Home ?Falls can cause injuries. They can happen to people of all ages. There are many things you can do to make your home safe and to help prevent falls. ?What can I do on the outside of my home? ?Regularly fix the edges of walkways and driveways and fix any cracks. ?Remove anything that might make you trip as you walk through a door, such as a raised step or threshold. ?Trim any bushes or trees on the path to your home. ?Use bright outdoor lighting. ?Clear any walking paths of anything that might make someone trip, such as rocks or tools. ?Regularly check to see if handrails are loose or broken. Make sure that both sides of any steps have handrails. ?Any raised decks and porches should have guardrails on the edges. ?Have any leaves, snow, or ice cleared regularly. ?Use sand or salt on walking paths during winter. ?Clean up any spills in your garage right away. This includes oil or grease spills. ?What can I do in the bathroom? ?Use night lights. ?Install grab bars by the toilet and in the tub and shower. Do not use towel bars as grab bars. ?Use non-skid mats or decals in the tub or shower. ?If you need to sit down in the shower, use a plastic, non-slip stool. ?Keep the floor dry. Clean up any  water that spills on the floor as soon as it happens. ?Remove soap buildup in the tub or shower regularly. ?Attach bath mats securely with double-sided non-slip rug tape. ?Do not have throw rugs and other things on the floor that can make you trip. ?What can I do in the bedroom? ?Use night lights. ?Make sure that you have a light by your bed that is easy to reach. ?Do not use any sheets or blankets that are too big for your bed. They should not hang down onto the floor. ?Have a firm chair that has side arms. You can use this for support while you get dressed. ?Do not have throw rugs and other things on the floor that can make you trip. ?What can I do in the kitchen? ?Clean up any spills right away. ?Avoid walking on wet floors. ?Keep items that you use a lot in easy-to-reach places. ?If you need to reach something above you, use a strong step stool that has a grab bar. ?Keep electrical cords out of the way. ?Do not use floor polish or wax that makes floors slippery. If you must use wax, use non-skid floor wax. ?Do not  have throw rugs and other things on the floor that can make you trip. ?What can I do with my stairs? ?Do not leave any items on the stairs. ?Make sure that there are handrails on both sides of the stairs and use them. Fix handrails that are broken or loose. Make sure that handrails are as long as the stairways. ?Check any carpeting to make sure that it is firmly attached to the stairs. Fix any carpet that is loose or worn. ?Avoid having throw rugs at the top or bottom of the stairs. If you do have throw rugs, attach them to the floor with carpet tape. ?Make sure that you have a light switch at the top of the stairs and the bottom of the stairs. If you do not have them, ask someone to add them for you. ?What else can I do to help prevent falls? ?Wear shoes that: ?Do not have high heels. ?Have rubber bottoms. ?Are comfortable and fit you well. ?Are closed at the toe. Do not wear sandals. ?If you use a  stepladder: ?Make sure that it is fully opened. Do not climb a closed stepladder. ?Make sure that both sides of the stepladder are locked into place. ?Ask someone to hold it for you, if possible. ?Clearly mark an

## 2021-05-28 DIAGNOSIS — M25562 Pain in left knee: Secondary | ICD-10-CM | POA: Diagnosis not present

## 2021-05-28 DIAGNOSIS — M545 Low back pain, unspecified: Secondary | ICD-10-CM | POA: Diagnosis not present

## 2021-07-09 ENCOUNTER — Other Ambulatory Visit: Payer: Self-pay | Admitting: Family Medicine

## 2021-07-09 NOTE — Telephone Encounter (Signed)
Last filled on 10/21/20 Last ov 03/25/21

## 2021-07-16 ENCOUNTER — Other Ambulatory Visit: Payer: Medicare Other

## 2021-07-16 DIAGNOSIS — E7800 Pure hypercholesterolemia, unspecified: Secondary | ICD-10-CM

## 2021-07-17 LAB — LIPID PANEL
Chol/HDL Ratio: 2.4 ratio (ref 0.0–4.4)
Cholesterol, Total: 139 mg/dL (ref 100–199)
HDL: 58 mg/dL (ref >39)
LDL Chol Calc (NIH): 66 mg/dL (ref 0–99)
Triglycerides: 77 mg/dL (ref 0–149)
VLDL Cholesterol Cal: 15 mg/dL (ref 5–40)

## 2021-07-17 LAB — ALT: ALT: 23 IU/L (ref 0–32)

## 2021-09-04 ENCOUNTER — Ambulatory Visit (INDEPENDENT_AMBULATORY_CARE_PROVIDER_SITE_OTHER): Payer: Medicare Other | Admitting: Family

## 2021-09-04 ENCOUNTER — Encounter: Payer: Self-pay | Admitting: Family

## 2021-09-04 VITALS — BP 112/64 | HR 60 | Temp 98.6°F | Resp 16 | Ht 62.0 in | Wt 141.1 lb

## 2021-09-04 DIAGNOSIS — J029 Acute pharyngitis, unspecified: Secondary | ICD-10-CM

## 2021-09-04 DIAGNOSIS — R0982 Postnasal drip: Secondary | ICD-10-CM

## 2021-09-04 DIAGNOSIS — H9311 Tinnitus, right ear: Secondary | ICD-10-CM

## 2021-09-04 DIAGNOSIS — H6123 Impacted cerumen, bilateral: Secondary | ICD-10-CM | POA: Diagnosis not present

## 2021-09-04 LAB — POCT RAPID STREP A (OFFICE): Rapid Strep A Screen: NEGATIVE

## 2021-09-04 NOTE — Progress Notes (Signed)
Established Patient Office Visit  Subjective:  Patient ID: Jamie Garza, female    DOB: 1955-09-09  Age: 66 y.o. MRN: [id number]  CC:  Chief Complaint  Patient presents with   Ear Pain    Right ear Humming in ear x several month. Feels stopped up.    HPI Jamie Garza is here today with concerns.   Right ear with pain and humming sensation. Right > left.  Humming in ear has been for the last two months.  Does have hearing test with audiologist in October.   No sore throat. No nasal congestion.  No fever no chills.   Has been sneezing in the last few days.   Past Medical History:  Diagnosis Date   CAD (coronary artery disease), native coronary artery    coronary CTA with 25-49% prox LAD and coronary Ca score of 242 in 09/2018   Depression with anxiety 10/30/2011   Distal radius fracture, left 05/23/2011   Essential hypertension 06/14/2018   History of radius fracture 05/23/2011   Of note- nl dexa 2013-not a fragility fracture    Hyperlipidemia 09/02/2016   Insomnia    Pulmonary hypertension (HCC)    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 10/2018   SLEEP APNEA 01/04/2007   Qualifier: Diagnosis of  By: Jillyn Hidden FNP, Mcarthur Rossetti    Spinal stenosis, lumbar    L4 and L5 - severe; L3 and L4 - moderate   Stress reaction 07/18/2018   Wrist fracture 11/2019   right     Past Surgical History:  Procedure Laterality Date   CARPAL TUNNEL RELEASE Right 12/2018   COLONOSCOPY  08/2009   Jarold Motto  tic's   WRIST FRACTURE SURGERY  2013    Family History  Problem Relation Age of Onset   Diabetes Mother        borderline DM   Aortic stenosis Mother    Diabetes Maternal Aunt    Diabetes Maternal Grandfather    Diabetes Paternal Grandfather    CAD Brother    Aortic stenosis Brother    Breast cancer Neg Hx    Colon cancer Neg Hx    Colon polyps Neg Hx    Esophageal cancer Neg Hx    Rectal cancer Neg Hx    Stomach cancer Neg Hx     Social History   Socioeconomic  History   Marital status: Married    Spouse name: Not on file   Number of children: Not on file   Years of education: Not on file   Highest education level: Not on file  Occupational History   Not on file  Tobacco Use   Smoking status: Never   Smokeless tobacco: Never  Vaping Use   Vaping Use: Never used  Substance and Sexual Activity   Alcohol use: Yes    Comment: occasional   Drug use: No   Sexual activity: Not on file  Other Topics Concern   Not on file  Social History Narrative   ** Merged History Encounter **       Social Determinants of Health   Financial Resource Strain: Low Risk  (05/27/2021)   Overall Financial Resource Strain (CARDIA)    Difficulty of Paying Living Expenses: Not hard at all  Food Insecurity: No Food Insecurity (05/27/2021)   Hunger Vital Sign    Worried About Running Out of Food in the Last Year: Never true    Ran Out of Food in the Last Year: Never  true  Transportation Needs: No Transportation Needs (05/27/2021)   PRAPARE - Administrator, Civil Service (Medical): No    Lack of Transportation (Non-Medical): No  Physical Activity: Sufficiently Active (05/27/2021)   Exercise Vital Sign    Days of Exercise per Week: 5 days    Minutes of Exercise per Session: 30 min  Stress: No Stress Concern Present (05/27/2021)   [employer] of Occupational Health - Occupational Stress Questionnaire    Feeling of Stress : Not at all  Social Connections: Socially Integrated (05/27/2021)   Social Connection and Isolation Panel [NHANES]    Frequency of Communication with Friends and Family: More than three times a week    Frequency of Social Gatherings with Friends and Family: More than three times a week    Attends Religious Services: More than 4 times per year    Active Member of [employer] or Organizations: Yes    Attends Engineer, structural: More than 4 times per year    Marital Status: Married  Catering manager Violence: Not At Risk (05/27/2021)    Humiliation, Afraid, Rape, and Kick questionnaire    Fear of Current or Ex-Partner: No    Emotionally Abused: No    Physically Abused: No    Sexually Abused: No    Outpatient Medications Prior to Visit  Medication Sig Dispense Refill   aspirin EC 81 MG tablet Take 1 tablet (81 mg total) by mouth daily. 90 tablet 3   atorvastatin (LIPITOR) 40 MG tablet Take 1 tablet (40 mg total) by mouth daily. 90 tablet 3   betamethasone dipropionate 0.05 % lotion Apply topically.     Ca Carbonate-Mag Hydroxide (ROLAIDS EXTRA STRENGTH PO) Take by mouth. Takes for Calcium supplement     fluticasone (FLONASE) 50 MCG/ACT nasal spray Place 2 sprays into both nostrils daily. 16 g 6   losartan (COZAAR) 100 MG tablet Take 1 tablet (100 mg total) by mouth daily. 90 tablet 3   LYSINE HCL PO Take 1 tablet by mouth daily.     naproxen sodium (ALEVE) 220 MG tablet Take 220 mg by mouth at bedtime as needed.      Probiotic Product (PROBIOTIC DAILY PO) Take 1 capsule by mouth 2 (two) times a week.      traZODone (DESYREL) 50 MG tablet Take 1 tablet (50 mg total) by mouth at bedtime. 90 tablet 3   TURMERIC PO Take 2,000 mg by mouth daily.     valACYclovir (VALTREX) 1000 MG tablet TAKE TWO TABLETS BY MOUTH EVERY TWELVE HOURS FOR ONE DAY FOR FEVER BLISTER/COLD SORE 4 tablet 11   benzonatate (TESSALON) 200 MG capsule Take 1 capsule (200 mg total) by mouth 3 (three) times daily as needed for cough. Swallow whole (Patient not taking: Reported on 09/04/2021) 30 capsule 1   No facility-administered medications prior to visit.    No Known Allergies      Objective:    Physical Exam Constitutional:      General: She is not in acute distress.    Appearance: Normal appearance. She is not ill-appearing.  HENT:     Right Ear: Tympanic membrane normal. There is impacted cerumen.     Left Ear: Tympanic membrane normal. There is impacted cerumen.     Nose: Nose normal. No congestion or rhinorrhea.     Right Turbinates:  Not enlarged or swollen.     Left Turbinates: Not enlarged or swollen.     Right Sinus: No maxillary sinus tenderness  or frontal sinus tenderness.     Left Sinus: No maxillary sinus tenderness or frontal sinus tenderness.     Mouth/Throat:     Mouth: Mucous membranes are moist.     Pharynx: Posterior oropharyngeal erythema present. No pharyngeal swelling or oropharyngeal exudate.     Tonsils: No tonsillar exudate. 0 on the right. 0 on the left.  Eyes:     Extraocular Movements: Extraocular movements intact.     Conjunctiva/sclera: Conjunctivae normal.     Pupils: Pupils are equal, round, and reactive to light.  Neck:     Thyroid: No thyroid mass.  Cardiovascular:     Rate and Rhythm: Normal rate and regular rhythm.  Pulmonary:     Effort: Pulmonary effort is normal.     Breath sounds: Normal breath sounds.  Lymphadenopathy:     Cervical:     Right cervical: No superficial cervical adenopathy.    Left cervical: No superficial cervical adenopathy.  Neurological:     Mental Status: She is alert.     BP 112/64   Pulse 60   Temp 98.6 F (37 C)   Resp 16   Ht 5\' 2"  (1.575 m)   Wt 141 lb 2 oz (64 kg)   SpO2 98%   BMI 25.81 kg/m  Wt Readings from Last 3 Encounters:  09/04/21 141 lb 2 oz (64 kg)  05/27/21 139 lb (63 kg)  05/12/21 139 lb 9.6 oz (63.3 kg)     Health Maintenance Due  Topic Date Due   COVID-19 Vaccine (4 - Pfizer risk series) 10/04/2020   INFLUENZA VACCINE  08/19/2021    There are no preventive care reminders to display for this patient.  Lab Results  Component Value Date   TSH 1.92 10/21/2020   Lab Results  Component Value Date   WBC 5.0 10/21/2020   HGB 12.8 10/21/2020   HCT 39.4 10/21/2020   MCV 91.7 10/21/2020   PLT 205.0 10/21/2020   Lab Results  Component Value Date   NA 141 10/21/2020   K 4.3 10/21/2020   CO2 28 10/21/2020   GLUCOSE 90 10/21/2020   BUN 14 10/21/2020   CREATININE 0.64 10/21/2020   BILITOT 0.5 10/21/2020   ALKPHOS 65  10/21/2020   AST 22 10/21/2020   ALT 23 07/16/2021   PROT 7.1 10/21/2020   ALBUMIN 4.2 10/21/2020   CALCIUM 9.6 10/21/2020   GFR 92.35 10/21/2020   No results found for: "HGBA1C"    Assessment & Plan:   Problem List Items Addressed This Visit       Nervous and Auditory   Bilateral hearing loss due to cerumen impaction    Ceruminosis is noted.  Obtained verbal patient consent prior to procedure, possible risks of procedure discussed with pt prior, and then Wax was removed by syringing/irrigation and manual debridement was performed by me with curette. Instructions for home care to prevent wax buildup are given and handout provided to pt .pt tolerated procedure well.  Post irrigation and manual irrigation, ears WNL and no erythema.   Recommend for maintenance half cap of hydrogen peroxide with half water, let it dwell nightly prior to showering then drain out of ears. Do not use Q tips as will impact further.         Other   Sore throat - Primary    Strep tested in office, negative Warm salt water gargles        Relevant Orders   POCT rapid strep A (Completed)  Right-sided tinnitus    Continue f/u and consult with audiologist.       Post-nasal drip    Causing pharyngeal redness, advised pt to start flonase 50 mcg otc       No orders of the defined types were placed in this encounter.   Follow-up: Return if symptoms worsen or fail to improve with pcp.    Mort Sawyers, FNP

## 2021-09-04 NOTE — Assessment & Plan Note (Addendum)
Strep tested in office, negative Warm salt water gargles   

## 2021-09-04 NOTE — Assessment & Plan Note (Signed)
Continue f/u and consult with audiologist.

## 2021-09-04 NOTE — Assessment & Plan Note (Signed)
Causing pharyngeal redness, advised pt to start flonase 50 mcg otc

## 2021-09-04 NOTE — Assessment & Plan Note (Addendum)
Ceruminosis is noted.  Obtained verbal patient consent prior to procedure, possible risks of procedure discussed with pt prior, and then Wax was removed by syringing/irrigation and manual debridement was performed by me with curette. Instructions for home care to prevent wax buildup are given and handout provided to pt .pt tolerated procedure well.  Post irrigation and manual irrigation, ears WNL and no erythema.   Recommend for maintenance half cap of hydrogen peroxide with half water, let it dwell nightly prior to showering then drain out of ears. Do not use Q tips as will impact further.

## 2021-09-04 NOTE — Patient Instructions (Signed)
  Regards,   Jamie Garza FNP-C  

## 2021-10-06 DIAGNOSIS — H6123 Impacted cerumen, bilateral: Secondary | ICD-10-CM | POA: Diagnosis not present

## 2021-10-13 ENCOUNTER — Other Ambulatory Visit: Payer: Self-pay | Admitting: Family Medicine

## 2021-10-13 DIAGNOSIS — Z1231 Encounter for screening mammogram for malignant neoplasm of breast: Secondary | ICD-10-CM

## 2021-10-23 ENCOUNTER — Encounter: Payer: Self-pay | Admitting: Family Medicine

## 2021-10-23 ENCOUNTER — Ambulatory Visit (INDEPENDENT_AMBULATORY_CARE_PROVIDER_SITE_OTHER): Payer: Medicare Other | Admitting: Family Medicine

## 2021-10-23 VITALS — BP 126/78 | HR 61 | Temp 97.8°F | Ht 61.0 in | Wt 142.4 lb

## 2021-10-23 DIAGNOSIS — Z0000 Encounter for general adult medical examination without abnormal findings: Secondary | ICD-10-CM | POA: Diagnosis not present

## 2021-10-23 DIAGNOSIS — I10 Essential (primary) hypertension: Secondary | ICD-10-CM | POA: Diagnosis not present

## 2021-10-23 DIAGNOSIS — F5104 Psychophysiologic insomnia: Secondary | ICD-10-CM | POA: Diagnosis not present

## 2021-10-23 DIAGNOSIS — I2510 Atherosclerotic heart disease of native coronary artery without angina pectoris: Secondary | ICD-10-CM

## 2021-10-23 DIAGNOSIS — Z23 Encounter for immunization: Secondary | ICD-10-CM | POA: Diagnosis not present

## 2021-10-23 DIAGNOSIS — M85859 Other specified disorders of bone density and structure, unspecified thigh: Secondary | ICD-10-CM

## 2021-10-23 DIAGNOSIS — R0982 Postnasal drip: Secondary | ICD-10-CM

## 2021-10-23 DIAGNOSIS — E7800 Pure hypercholesterolemia, unspecified: Secondary | ICD-10-CM

## 2021-10-23 DIAGNOSIS — M8580 Other specified disorders of bone density and structure, unspecified site: Secondary | ICD-10-CM | POA: Insufficient documentation

## 2021-10-23 DIAGNOSIS — R911 Solitary pulmonary nodule: Secondary | ICD-10-CM

## 2021-10-23 LAB — CBC WITH DIFFERENTIAL/PLATELET
Basophils Absolute: 0 10*3/uL (ref 0.0–0.1)
Basophils Relative: 0.4 % (ref 0.0–3.0)
Eosinophils Absolute: 0.2 10*3/uL (ref 0.0–0.7)
Eosinophils Relative: 3.7 % (ref 0.0–5.0)
HCT: 37.7 % (ref 36.0–46.0)
Hemoglobin: 12.8 g/dL (ref 12.0–15.0)
Lymphocytes Relative: 46.8 % — ABNORMAL HIGH (ref 12.0–46.0)
Lymphs Abs: 2.2 10*3/uL (ref 0.7–4.0)
MCHC: 33.8 g/dL (ref 30.0–36.0)
MCV: 91.7 fl (ref 78.0–100.0)
Monocytes Absolute: 0.7 10*3/uL (ref 0.1–1.0)
Monocytes Relative: 15.1 % — ABNORMAL HIGH (ref 3.0–12.0)
Neutro Abs: 1.6 10*3/uL (ref 1.4–7.7)
Neutrophils Relative %: 34 % — ABNORMAL LOW (ref 43.0–77.0)
Platelets: 176 10*3/uL (ref 150.0–400.0)
RBC: 4.11 Mil/uL (ref 3.87–5.11)
RDW: 14.2 % (ref 11.5–15.5)
WBC: 4.7 10*3/uL (ref 4.0–10.5)

## 2021-10-23 LAB — COMPREHENSIVE METABOLIC PANEL
ALT: 25 U/L (ref 0–35)
AST: 31 U/L (ref 0–37)
Albumin: 3.9 g/dL (ref 3.5–5.2)
Alkaline Phosphatase: 68 U/L (ref 39–117)
BUN: 17 mg/dL (ref 6–23)
CO2: 29 mEq/L (ref 19–32)
Calcium: 9.2 mg/dL (ref 8.4–10.5)
Chloride: 105 mEq/L (ref 96–112)
Creatinine, Ser: 0.67 mg/dL (ref 0.40–1.20)
GFR: 90.69 mL/min (ref >60.00)
Glucose, Bld: 86 mg/dL (ref 70–99)
Potassium: 3.8 mEq/L (ref 3.5–5.1)
Sodium: 140 mEq/L (ref 135–145)
Total Bilirubin: 0.4 mg/dL (ref 0.2–1.2)
Total Protein: 6.7 g/dL (ref 6.0–8.3)

## 2021-10-23 LAB — LIPID PANEL
Cholesterol: 115 mg/dL (ref 0–200)
HDL: 47 mg/dL (ref >39.00)
LDL Cholesterol: 54 mg/dL (ref 0–99)
NonHDL: 67.54
Total CHOL/HDL Ratio: 2
Triglycerides: 70 mg/dL (ref 0.0–149.0)
VLDL: 14 mg/dL (ref 0.0–40.0)

## 2021-10-23 LAB — TSH: TSH: 2 u[IU]/mL (ref 0.35–5.50)

## 2021-10-23 NOTE — Assessment & Plan Note (Signed)
bp in fair control at this time  BP Readings from Last 1 Encounters:  10/23/21 126/78   No changes needed Most recent labs reviewed  Disc lifstyle change with low sodium diet and exercise  Losartan 100 mg daily  Labs ordered

## 2021-10-23 NOTE — Assessment & Plan Note (Signed)
Due for re check  Will order CT

## 2021-10-23 NOTE — Progress Notes (Signed)
Subjective:    Patient ID: Jamie Garza, female    DOB: 1955-10-08, 66 y.o.   MRN: [id number]  HPI Here for health maintenance exam and to review chronic medical problems  Has post nasal drip     Wt Readings from Last 3 Encounters:  10/23/21 142 lb 6 oz (64.6 kg)  09/04/21 141 lb 2 oz (64 kg)  05/27/21 139 lb (63 kg)   26.90 kg/m  Had vacation to [place]  Then caught a cold  Some post nasal drip remains after that  Covid tests have been negative  No fever   Had wax cleaned from ears then went for audiology appt Using flonase daily    Monday had sinus headache and nausea and loose stool  That is better now     Immunization History  Administered Date(s) Administered   Fluad Quad(high Dose 65+) 10/21/2020   Influenza Whole 11/20/2008   Influenza,inj,Quad PF,6+ Mos 10/21/2015, 10/22/2016, 11/01/2018   Influenza-Unspecified 10/20/2013, 09/19/2017   PFIZER(Purple Top)SARS-COV-2 Vaccination 03/14/2019, 04/06/2019   PNEUMOCOCCAL CONJUGATE-20 10/21/2020   Pfizer Covid-19 Vaccine Bivalent Booster 31yrs & up 08/09/2020   Td 11/20/2008   Tdap 11/01/2018   Zoster Recombinat (Shingrix) 08/30/2018, 12/20/2018   Zoster, Live 10/28/2015   Health Maintenance Due  Topic Date Due   COVID-19 Vaccine (4 - Pfizer risk series) 10/04/2020   INFLUENZA VACCINE  08/19/2021   Flu shot: today   Planning the covid booster   Mammogram 10/2020, has it scheduled later this month  No lumps on self exam    Colonoscopy 06/2020  with 10 y recall Some occ constipation  Takes fiber   Takes probiotic    Dexa  10/2020 noted osteopenia  Falls: had one fall /missed a gap in the pavement  Fractures:  Supplements : takes tums for calcium  Exercise : doing some cardio and strength training with weights  Walking videos   Mood    05/27/2021   10:37 AM 10/21/2020    8:35 AM 09/11/2019    9:51 AM 09/09/2018    3:19 PM 06/14/2018   10:45 AM  Depression screen PHQ 2/9  Decreased Interest 0 0  0 0 0  Down, Depressed, Hopeless 0 0 0 0 0  PHQ - 2 Score 0 0 0 0 0  Altered sleeping   0    Tired, decreased energy   0    Change in appetite   0    Feeling bad or failure about yourself    0    Trouble concentrating   0    Moving slowly or fidgety/restless   0    Suicidal thoughts   0    PHQ-9 Score   0    Difficult doing work/chores   Not difficult at all         HTN bp is stable today  No cp or palpitations or headaches or edema  No side effects to medicines  BP Readings from Last 3 Encounters:  10/23/21 126/78  09/04/21 112/64  05/12/21 124/76    Losartan 100 mg daily   Lab Results  Component Value Date   CREATININE 0.64 10/21/2020   BUN 14 10/21/2020   NA 141 10/21/2020   K 4.3 10/21/2020   CL 106 10/21/2020   CO2 28 10/21/2020   Due for labs   Hyperlipidemia Lab Results  Component Value Date   CHOL 139 07/16/2021   HDL 58 07/16/2021   LDLCALC 66 07/16/2021   LDLDIRECT 137.8 12/03/2009  TRIG 77 07/16/2021   CHOLHDL 2.4 07/16/2021   Due for labs Takes atorvastatin 40 mg  Sees cardiology  Diet is pretty good   Patient Active Problem List   Diagnosis Date Noted   Osteopenia 10/23/2021   Right-sided tinnitus 09/04/2021   Post-nasal drip 09/04/2021   Estrogen deficiency 10/21/2020   Tinnitus 04/13/2020   CAD (coronary artery disease), native coronary artery    Pulmonary nodule 06/01/2019   H/O cold sores 01/04/2019   Family history of heart disease 08/05/2018   Essential hypertension 06/14/2018   Spinal stenosis 09/02/2016   Hyperlipidemia 09/02/2016   Routine general medical examination at a health care facility 12/15/2010   Insomnia 11/20/2008   Sleep apnea 01/04/2007   Past Medical History:  Diagnosis Date   CAD (coronary artery disease), native coronary artery    coronary CTA with 25-49% prox LAD and coronary Ca score of 242 in 09/2018   Depression with anxiety 10/30/2011   Distal radius fracture, left 05/23/2011   Essential  hypertension 06/14/2018   History of radius fracture 05/23/2011   Of note- nl dexa 2013-not a fragility fracture    Hyperlipidemia 09/02/2016   Insomnia    Pulmonary hypertension (HCC)    PASP 43mmHg with mild to moderate MR by echo 10/2018   SLEEP APNEA 01/04/2007   Qualifier: Diagnosis of  By: Maxie Better FNP, Rosalita Levan    Spinal stenosis, lumbar    L4 and L5 - severe; L3 and L4 - moderate   Stress reaction 07/18/2018   Wrist fracture 11/2019   right    Past Surgical History:  Procedure Laterality Date   CARPAL TUNNEL RELEASE Right 12/2018   COLONOSCOPY  08/2009   Sharlett Iles  tic's   WRIST FRACTURE SURGERY  2013   Social History   Tobacco Use   Smoking status: Never   Smokeless tobacco: Never  Vaping Use   Vaping Use: Never used  Substance Use Topics   Alcohol use: Yes    Comment: occasional   Drug use: No   Family History  Problem Relation Age of Onset   Diabetes Mother        borderline DM   Aortic stenosis Mother    Diabetes Maternal Aunt    Diabetes Maternal Grandfather    Diabetes Paternal Grandfather    CAD Brother    Aortic stenosis Brother    Breast cancer Neg Hx    Colon cancer Neg Hx    Colon polyps Neg Hx    Esophageal cancer Neg Hx    Rectal cancer Neg Hx    Stomach cancer Neg Hx    No Known Allergies Current Outpatient Medications on File Prior to Visit  Medication Sig Dispense Refill   aspirin EC 81 MG tablet Take 1 tablet (81 mg total) by mouth daily. 90 tablet 3   atorvastatin (LIPITOR) 40 MG tablet Take 1 tablet (40 mg total) by mouth daily. 90 tablet 3   betamethasone dipropionate 0.05 % lotion Apply topically.     Ca Carbonate-Mag Hydroxide (ROLAIDS EXTRA STRENGTH PO) Take by mouth. Takes for Calcium supplement     fluticasone (FLONASE) 50 MCG/ACT nasal spray Place 2 sprays into both nostrils daily. 16 g 6   losartan (COZAAR) 100 MG tablet Take 1 tablet (100 mg total) by mouth daily. 90 tablet 3   LYSINE HCL PO Take 1 tablet by mouth once  a week.     naproxen sodium (ALEVE) 220 MG tablet Take 220 mg by mouth at  bedtime as needed.      traZODone (DESYREL) 50 MG tablet Take 1 tablet (50 mg total) by mouth at bedtime. 90 tablet 3   TURMERIC PO Take 2,000 mg by mouth daily.     valACYclovir (VALTREX) 1000 MG tablet TAKE TWO TABLETS BY MOUTH EVERY TWELVE HOURS FOR ONE DAY FOR FEVER BLISTER/COLD SORE 4 tablet 11   No current facility-administered medications on file prior to visit.    Review of Systems  Constitutional:  Negative for activity change, appetite change, fatigue, fever and unexpected weight change.  HENT:  Positive for postnasal drip. Negative for congestion, ear pain, rhinorrhea, sinus pressure and sore throat.   Eyes:  Negative for pain, redness and visual disturbance.  Respiratory:  Negative for cough, shortness of breath and wheezing.   Cardiovascular:  Negative for chest pain and palpitations.  Gastrointestinal:  Positive for constipation. Negative for abdominal pain, blood in stool and diarrhea.  Endocrine: Negative for polydipsia and polyuria.  Genitourinary:  Negative for dysuria, frequency and urgency.  Musculoskeletal:  Negative for arthralgias, back pain and myalgias.  Skin:  Negative for pallor and rash.  Allergic/Immunologic: Negative for environmental allergies.  Neurological:  Negative for dizziness, syncope and headaches.  Hematological:  Negative for adenopathy. Does not bruise/bleed easily.  Psychiatric/Behavioral:  Negative for decreased concentration and dysphoric mood. The patient is not nervous/anxious.        Objective:   Physical Exam Constitutional:      General: She is not in acute distress.    Appearance: Normal appearance. She is well-developed and normal weight. She is not ill-appearing or diaphoretic.  HENT:     Head: Normocephalic and atraumatic.     Right Ear: Tympanic membrane, ear canal and external ear normal.     Left Ear: Tympanic membrane, ear canal and external ear normal.      Nose: Nose normal. No congestion.     Mouth/Throat:     Mouth: Mucous membranes are moist.     Pharynx: Oropharynx is clear. No posterior oropharyngeal erythema.  Eyes:     General: No scleral icterus.    Extraocular Movements: Extraocular movements intact.     Conjunctiva/sclera: Conjunctivae normal.     Pupils: Pupils are equal, round, and reactive to light.  Neck:     Thyroid: No thyromegaly.     Vascular: No carotid bruit or JVD.  Cardiovascular:     Rate and Rhythm: Normal rate and regular rhythm.     Pulses: Normal pulses.     Heart sounds: Normal heart sounds.     No gallop.  Pulmonary:     Effort: Pulmonary effort is normal. No respiratory distress.     Breath sounds: Normal breath sounds. No wheezing.     Comments: Good air exch Chest:     Chest wall: No tenderness.  Abdominal:     General: Bowel sounds are normal. There is no distension or abdominal bruit.     Palpations: Abdomen is soft. There is no mass.     Tenderness: There is no abdominal tenderness.     Hernia: No hernia is present.  Genitourinary:    Comments: Breast exam: No mass, nodules, thickening, tenderness, bulging, retraction, inflamation, nipple discharge or skin changes noted.  No axillary or clavicular LA.     Musculoskeletal:        General: No tenderness. Normal range of motion.     Cervical back: Normal range of motion and neck supple. No rigidity. No muscular tenderness.  Right lower leg: No edema.     Left lower leg: No edema.     Comments: No kyphosis   Lymphadenopathy:     Cervical: No cervical adenopathy.  Skin:    General: Skin is warm and dry.     Coloration: Skin is not pale.     Findings: No erythema or rash.     Comments: Solar lentigines diffusely   Neurological:     Mental Status: She is alert. Mental status is at baseline.     Cranial Nerves: No cranial nerve deficit.     Motor: No abnormal muscle tone.     Coordination: Coordination normal.     Gait: Gait normal.      Deep Tendon Reflexes: Reflexes are normal and symmetric. Reflexes normal.  Psychiatric:        Mood and Affect: Mood normal.        Cognition and Memory: Cognition and memory normal.           Assessment & Plan:   Problem List Items Addressed This Visit       Cardiovascular and Mediastinum   CAD (coronary artery disease), native coronary artery    Under care of cardiology Taking low dose asa  bp is controlled and takes statin      Essential hypertension    bp in fair control at this time  BP Readings from Last 1 Encounters:  10/23/21 126/78  No changes needed Most recent labs reviewed  Disc lifstyle change with low sodium diet and exercise  Losartan 100 mg daily  Labs ordered       Relevant Orders   TSH   Lipid panel   Comprehensive metabolic panel   CBC with Differential/Platelet     Respiratory   Pulmonary nodule    Due for re check  Will order CT       Relevant Orders   CT Chest Wo Contrast     Musculoskeletal and Integument   Osteopenia    dexa is utd 10/2020  No fractures One fall-disc fall prev Disc opt for exercise/weight bearing  Taking ca and D  Re check in a yr        Other   Hyperlipidemia    Disc goals for lipids and reasons to control them Rev last labs with pt Rev low sat fat diet in detail Labs today  Takes atorvastatin 40 mg daily       Relevant Orders   Lipid panel   Insomnia    Continues prn trazodone      Post-nasal drip    Recommend otc non sedating antihistamine      Routine general medical examination at a health care facility - Primary    Reviewed health habits including diet and exercise and skin cancer prevention Reviewed appropriate screening tests for age  Also reviewed health mt list, fam hx and immunization status , as well as social and family history   See HPI Labs ordered  Flu shot given  She plans to get newest covid booster Mammogram is scheduled this mo/utd Colonoscopy utd 06/2020 with 10 y  recall  dexa utd 10/2020 with no fractures (one fall, disc fall prevention and goals for fitness as well as ca and D)      Relevant Orders   Flu Vaccine QUAD High Dose(Fluad) (Completed)   Other Visit Diagnoses     Need for influenza vaccination       Relevant Orders   Flu Vaccine QUAD High  Dose(Fluad) (Completed)

## 2021-10-23 NOTE — Assessment & Plan Note (Signed)
Reviewed health habits including diet and exercise and skin cancer prevention Reviewed appropriate screening tests for age  Also reviewed health mt list, fam hx and immunization status , as well as social and family history   See HPI Labs ordered  Flu shot given  She plans to get newest covid booster Mammogram is scheduled this mo/utd Colonoscopy utd 06/2020 with 10 y recall  dexa utd 10/2020 with no fractures (one fall, disc fall prevention and goals for fitness as well as ca and D)

## 2021-10-23 NOTE — Patient Instructions (Addendum)
Try miralax for constipation - you may need it daily  Eat more high fiber foods  Aim for 64 oz of fluids per day   Keep fit  Keep working on strength  Yoga or chair yoga is very helpful   Get at least 2000 iu vitamin D3 with calcium  Calcium can constipate -be aware   Try zyrtec 10 mg at bedtime for post nasal drip (or claritin or allegra)  If flonase causes sore throat then hold it   Flu shot today   I will order your CT scan to watch lung nodule  If you don't get a call in 1-2 weeks let us know

## 2021-10-23 NOTE — Assessment & Plan Note (Signed)
dexa is utd 10/2020  No fractures One fall-disc fall prev Disc opt for exercise/weight bearing  Taking ca and D  Re check in a yr

## 2021-10-23 NOTE — Assessment & Plan Note (Signed)
Recommend otc non sedating antihistamine

## 2021-10-23 NOTE — Assessment & Plan Note (Signed)
Disc goals for lipids and reasons to control them Rev last labs with pt Rev low sat fat diet in detail Labs today  Takes atorvastatin 40 mg daily

## 2021-10-23 NOTE — Assessment & Plan Note (Signed)
Under care of cardiology Taking low dose asa  bp is controlled and takes statin

## 2021-10-23 NOTE — Assessment & Plan Note (Signed)
Continues prn trazodone

## 2021-10-24 ENCOUNTER — Encounter: Payer: Self-pay | Admitting: *Deleted

## 2021-10-28 DIAGNOSIS — M4310 Spondylolisthesis, site unspecified: Secondary | ICD-10-CM | POA: Diagnosis not present

## 2021-10-28 DIAGNOSIS — Z6826 Body mass index (BMI) 26.0-26.9, adult: Secondary | ICD-10-CM | POA: Diagnosis not present

## 2021-10-28 DIAGNOSIS — M48062 Spinal stenosis, lumbar region with neurogenic claudication: Secondary | ICD-10-CM | POA: Diagnosis not present

## 2021-10-29 ENCOUNTER — Ambulatory Visit
Admission: RE | Admit: 2021-10-29 | Discharge: 2021-10-29 | Disposition: A | Discharge status: Home / Self Care | Payer: Medicare Other | Source: Ambulatory Visit | Attending: Family Medicine | Admitting: Family Medicine

## 2021-10-29 DIAGNOSIS — I700 Atherosclerosis of aorta: Secondary | ICD-10-CM | POA: Diagnosis not present

## 2021-10-29 DIAGNOSIS — R911 Solitary pulmonary nodule: Secondary | ICD-10-CM | POA: Diagnosis not present

## 2021-11-10 ENCOUNTER — Ambulatory Visit
Admission: RE | Admit: 2021-11-10 | Discharge: 2021-11-10 | Disposition: A | Discharge status: Home / Self Care | Payer: Medicare Other | Source: Ambulatory Visit | Attending: Family Medicine | Admitting: Family Medicine

## 2021-11-10 DIAGNOSIS — Z1231 Encounter for screening mammogram for malignant neoplasm of breast: Secondary | ICD-10-CM | POA: Diagnosis not present

## 2021-11-20 DIAGNOSIS — D1801 Hemangioma of skin and subcutaneous tissue: Secondary | ICD-10-CM | POA: Diagnosis not present

## 2021-11-20 DIAGNOSIS — D2262 Melanocytic nevi of left upper limb, including shoulder: Secondary | ICD-10-CM | POA: Diagnosis not present

## 2021-11-20 DIAGNOSIS — L814 Other melanin hyperpigmentation: Secondary | ICD-10-CM | POA: Diagnosis not present

## 2021-11-20 DIAGNOSIS — D2261 Melanocytic nevi of right upper limb, including shoulder: Secondary | ICD-10-CM | POA: Diagnosis not present

## 2021-11-20 DIAGNOSIS — L218 Other seborrheic dermatitis: Secondary | ICD-10-CM | POA: Diagnosis not present

## 2021-11-20 DIAGNOSIS — L570 Actinic keratosis: Secondary | ICD-10-CM | POA: Diagnosis not present

## 2021-12-24 DIAGNOSIS — M25562 Pain in left knee: Secondary | ICD-10-CM | POA: Diagnosis not present

## 2021-12-24 DIAGNOSIS — M25512 Pain in left shoulder: Secondary | ICD-10-CM | POA: Diagnosis not present

## 2021-12-31 DIAGNOSIS — H53022 Refractive amblyopia, left eye: Secondary | ICD-10-CM | POA: Diagnosis not present

## 2021-12-31 DIAGNOSIS — H04123 Dry eye syndrome of bilateral lacrimal glands: Secondary | ICD-10-CM | POA: Diagnosis not present

## 2021-12-31 DIAGNOSIS — H524 Presbyopia: Secondary | ICD-10-CM | POA: Diagnosis not present

## 2022-01-29 ENCOUNTER — Other Ambulatory Visit: Payer: Self-pay | Admitting: Family Medicine

## 2022-01-30 NOTE — Telephone Encounter (Signed)
CPE was on 10/23/21, last filled on 01/07/21 #4 tabs/ 11 refills

## 2022-02-07 ENCOUNTER — Other Ambulatory Visit: Payer: Self-pay | Admitting: Cardiology

## 2022-02-25 DIAGNOSIS — M25562 Pain in left knee: Secondary | ICD-10-CM | POA: Diagnosis not present

## 2022-02-25 DIAGNOSIS — M25512 Pain in left shoulder: Secondary | ICD-10-CM | POA: Diagnosis not present

## 2022-02-26 ENCOUNTER — Telehealth: Payer: Self-pay | Admitting: *Deleted

## 2022-02-26 DIAGNOSIS — I788 Other diseases of capillaries: Secondary | ICD-10-CM | POA: Diagnosis not present

## 2022-02-26 DIAGNOSIS — L718 Other rosacea: Secondary | ICD-10-CM | POA: Diagnosis not present

## 2022-02-26 NOTE — Telephone Encounter (Signed)
   Pre-operative Risk Assessment    Patient Name: Jamie Garza  DOB: 1955-07-24 MRN: [id number]      Request for Surgical Clearance    Procedure:   LEFT TOTAL KNEE REPLACEMENT  Date of Surgery:  Clearance TBD                                 Surgeon:  Edmonia Lynch, MD Surgeon's Group or Practice Name:  [facility] Phone number:  [phone] Fax number:  [phone]   Type of Clearance Requested:   - Medical  - Pharmacy:  Hold Aspirin NOT INDICATED HOW LONG   Type of Anesthesia:  Spinal   Additional requests/questions:    [name]   02/26/2022, 7:18 AM

## 2022-02-26 NOTE — Telephone Encounter (Signed)
   Name: [name]  DOB: 1955/06/07  MRN: [id number]  Primary Cardiologist: Fransico Him, MD  Chart reviewed as part of pre-operative protocol coverage. Because of Jamie Garza's past medical history and time since last visit, she will require a follow-up in-office visit in order to better assess preoperative cardiovascular risk.  Pre-op covering staff: - Please schedule appointment and call patient to inform them. If patient already had an upcoming appointment within acceptable timeframe, please add "pre-op clearance" to the appointment notes so provider is aware. - Please contact requesting surgeon's office via preferred method (i.e, phone, fax) to inform them of need for appointment prior to surgery.  Per our algorithm patient can hold aspirin 7 days prior to procedure and resume when surgically safe.  Mable Fill, Marissa Nestle, NP  02/26/2022, 7:47 AM

## 2022-02-26 NOTE — Telephone Encounter (Signed)
Pt has been scheduled to see Dr. Radford Pax, 05/25/22, clearance will be addressed at that time, as per pt, she will not schedule the surgery until June or July, after tax season.  Will route to requesting surgeon's office to make them aware.

## 2022-05-07 ENCOUNTER — Telehealth: Payer: Self-pay | Admitting: Family Medicine

## 2022-05-07 NOTE — Telephone Encounter (Signed)
Contacted Jamie Garza to schedule their annual wellness visit. Appointment made for 06/11/2022.  [facility] Care Guide [facility] TEAM Direct Dial: [phone]

## 2022-05-20 ENCOUNTER — Ambulatory Visit (INDEPENDENT_AMBULATORY_CARE_PROVIDER_SITE_OTHER): Payer: Medicare Other | Admitting: Family Medicine

## 2022-05-20 VITALS — BP 116/62 | HR 61 | Temp 97.8°F | Ht 61.0 in | Wt 144.4 lb

## 2022-05-20 DIAGNOSIS — R911 Solitary pulmonary nodule: Secondary | ICD-10-CM

## 2022-05-20 DIAGNOSIS — Z01818 Encounter for other preprocedural examination: Secondary | ICD-10-CM | POA: Diagnosis not present

## 2022-05-20 DIAGNOSIS — I2510 Atherosclerotic heart disease of native coronary artery without angina pectoris: Secondary | ICD-10-CM | POA: Diagnosis not present

## 2022-05-20 DIAGNOSIS — I700 Atherosclerosis of aorta: Secondary | ICD-10-CM

## 2022-05-20 DIAGNOSIS — I10 Essential (primary) hypertension: Secondary | ICD-10-CM | POA: Diagnosis not present

## 2022-05-20 DIAGNOSIS — E7800 Pure hypercholesterolemia, unspecified: Secondary | ICD-10-CM

## 2022-05-20 DIAGNOSIS — G4730 Sleep apnea, unspecified: Secondary | ICD-10-CM | POA: Diagnosis not present

## 2022-05-20 DIAGNOSIS — J069 Acute upper respiratory infection, unspecified: Secondary | ICD-10-CM

## 2022-05-20 MED ORDER — CETIRIZINE HCL 10 MG PO TABS: 10.0000 mg | ORAL_TABLET | Freq: Every day | ORAL | 3 refills | Status: DC

## 2022-05-20 MED ORDER — BENZONATATE 200 MG PO CAPS: 200.0000 mg | ORAL_CAPSULE | Freq: Three times a day (TID) | ORAL | 1 refills | Status: DC | PRN

## 2022-05-20 MED ORDER — FLUTICASONE PROPIONATE 50 MCG/ACT NA SUSP: 2.0000 | Freq: Every day | NASAL | 6 refills | Status: DC

## 2022-05-20 NOTE — Progress Notes (Signed)
Subjective:    Patient ID: Jamie Garza, female    DOB: February 21, 1955, 68 y.o.   MRN: [id number]  HPI Pt presents for cough and congestion  Also pre surgical clearance eval  Wt Readings from Last 3 Encounters:  05/20/22 144 lb 6 oz (65.5 kg)  10/23/21 142 lb 6 oz (64.6 kg)  09/04/21 141 lb 2 oz (64 kg)   27.28 kg/m  Vitals:   05/20/22 1600  BP: 116/62  Pulse: 61  Temp: 97.8 F (36.6 C)  SpO2: 99%    ? Allergies Covid test negative at home  Stress of tax season was bad Got worn down  Started cough on Saturday  Got worse after outside walk  Throat is bothering her / a lot of pnd  Spells of coughing -mostly dry  Little phlegm today- no color to it  No wheezing No tightness  No pain with cough  Occ hoarse   Right ear is full feeling   No sinus pain  Has had some headaches   Has been staying in  No fever  No chills or aches     No urinary symptoms      Planning L TKR with Dr Eulah Pont with spinal anesthesia  Has not planned yet -possibly later this summer  The L knee problem affects gait and makes back hurt more    She has cardiology appt for clearance on 5/6   No known drug allergies  No latex or tape allergies  No problems with anesth in the past   HTN bp is stable today  No cp or palpitations or headaches or edema  No side effects to medicines  BP Readings from Last 3 Encounters:  05/20/22 116/62  10/23/21 126/78  09/04/21 112/64    Takes losartan 100 mg daily   Pulse Readings from Last 3 Encounters:  05/20/22 61  10/23/21 61  09/04/21 60   Last metabolic panel Lab Results  Component Value Date   GLUCOSE 86 10/23/2021   NA 140 10/23/2021   K 3.8 10/23/2021   CL 105 10/23/2021   CO2 29 10/23/2021   BUN 17 10/23/2021   CREATININE 0.67 10/23/2021   GFRNONAA 93 11/22/2018   CALCIUM 9.2 10/23/2021   PROT 6.7 10/23/2021   ALBUMIN 3.9 10/23/2021   BILITOT 0.4 10/23/2021   ALKPHOS 68 10/23/2021   AST 31 10/23/2021   ALT 25  10/23/2021   Lab Results  Component Value Date   WBC 4.7 10/23/2021   HGB 12.8 10/23/2021   HCT 37.7 10/23/2021   MCV 91.7 10/23/2021   PLT 176.0 10/23/2021     Aleve prn   Takes asa 81 mg daily   ? Of OSA in past  She does not think she has it  Some snoring / no somnolence or fatigue  The night she was supposed to do the test her dad died (she took the device back)  H/o pulm HTN   CT chest 10/2021 IMPRESSION: 1. 5 mm right middle lobe nodule has not significantly changed from 2020. Findings are favored as benign given stability. No follow-up necessary.   Aortic Atherosclerosis (ICD10-I70.0)  When not sick her exercise tolerance is pretty good  She uses bike at home Goes to gym occ  Limited by pain but not sob or cp   Patient Active Problem List   Diagnosis Date Noted   Pre-operative general physical examination 05/20/2022   Aortic atherosclerosis (HCC) 05/20/2022   Osteopenia 10/23/2021   Right-sided tinnitus  09/04/2021   Post-nasal drip 09/04/2021   Viral URI with cough 03/25/2021   Estrogen deficiency 10/21/2020   Tinnitus 04/13/2020   CAD (coronary artery disease), native coronary artery    Pulmonary nodule 06/01/2019   H/O cold sores 01/04/2019   Family history of heart disease 08/05/2018   Essential hypertension 06/14/2018   Spinal stenosis 09/02/2016   Hyperlipidemia 09/02/2016   Routine general medical examination at a health care facility 12/15/2010   Insomnia 11/20/2008   Sleep apnea 01/04/2007   Past Medical History:  Diagnosis Date   CAD (coronary artery disease), native coronary artery    coronary CTA with 25-49% prox LAD and coronary Ca score of 242 in 09/2018   Depression with anxiety 10/30/2011   Distal radius fracture, left 05/23/2011   Essential hypertension 06/14/2018   History of radius fracture 05/23/2011   Of note- nl dexa 2013-not a fragility fracture    Hyperlipidemia 09/02/2016   Insomnia    Pulmonary hypertension (HCC)    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/2018   SLEEP APNEA 01/04/2007   Qualifier: Diagnosis of  By: Jillyn Hidden FNP, Mcarthur Rossetti    Spinal stenosis, lumbar    L4 and L5 - severe; L3 and L4 - moderate   Stress reaction 07/18/2018   Wrist fracture 11/2019   right    Past Surgical History:  Procedure Laterality Date   CARPAL TUNNEL RELEASE Right 12/2018   COLONOSCOPY  08/2009   Jarold Motto  tic's   WRIST FRACTURE SURGERY  2013   Social History   Tobacco Use   Smoking status: Never   Smokeless tobacco: Never  Vaping Use   Vaping Use: Never used  Substance Use Topics   Alcohol use: Yes    Comment: occasional   Drug use: No   Family History  Problem Relation Age of Onset   Diabetes Mother        borderline DM   Aortic stenosis Mother    Diabetes Maternal Aunt    Diabetes Maternal Grandfather    Diabetes Paternal Grandfather    CAD Brother    Aortic stenosis Brother    Breast cancer Neg Hx    Colon cancer Neg Hx    Colon polyps Neg Hx    Esophageal cancer Neg Hx    Rectal cancer Neg Hx    Stomach cancer Neg Hx    No Known Allergies Current Outpatient Medications on File Prior to Visit  Medication Sig Dispense Refill   aspirin EC 81 MG tablet Take 1 tablet (81 mg total) by mouth daily. 90 tablet 3   atorvastatin (LIPITOR) 40 MG tablet Take 1 tablet (40 mg total) by mouth daily. 90 tablet 0   betamethasone dipropionate 0.05 % lotion Apply topically.     Ca Carbonate-Mag Hydroxide (ROLAIDS EXTRA STRENGTH PO) Take by mouth. Takes for Calcium supplement     losartan (COZAAR) 100 MG tablet Take 1 tablet (100 mg total) by mouth daily. 90 tablet 3   LYSINE HCL PO Take 1 tablet by mouth once a week.     naproxen sodium (ALEVE) 220 MG tablet Take 220 mg by mouth at bedtime as needed.      traZODone (DESYREL) 50 MG tablet Take 1 tablet (50 mg total) by mouth at bedtime. 90 tablet 3   TURMERIC PO Take 2,000 mg by mouth daily.     valACYclovir (VALTREX) 1000 MG tablet TAKE TWO  TABLETS BY MOUTH EVERY TWELVE HOURS FOR ONE  DAY FOR FEVER BLISTER/COLD SORE 4 tablet 11   No current facility-administered medications on file prior to visit.    Review of Systems  Constitutional:  Negative for activity change, appetite change, fatigue, fever and unexpected weight change.  HENT:  Negative for congestion, ear pain, rhinorrhea, sinus pressure and sore throat.   Eyes:  Negative for pain, redness and visual disturbance.  Respiratory:  Negative for cough, shortness of breath and wheezing.   Cardiovascular:  Negative for chest pain and palpitations.  Gastrointestinal:  Negative for abdominal pain, blood in stool, constipation and diarrhea.  Endocrine: Negative for polydipsia and polyuria.  Genitourinary:  Negative for dysuria, frequency and urgency.  Musculoskeletal:  Positive for arthralgias and back pain. Negative for myalgias.  Skin:  Negative for pallor and rash.  Allergic/Immunologic: Negative for environmental allergies.  Neurological:  Negative for dizziness, syncope and headaches.  Hematological:  Negative for adenopathy. Does not bruise/bleed easily.  Psychiatric/Behavioral:  Negative for decreased concentration and dysphoric mood. The patient is not nervous/anxious.        Objective:   Physical Exam Constitutional:      General: She is not in acute distress.    Appearance: Normal appearance. She is well-developed and normal weight. She is not ill-appearing or diaphoretic.  HENT:     Head: Normocephalic and atraumatic.     Comments: No sinus tenderness    Nose: Rhinorrhea present.     Comments: Nares are boggy    Mouth/Throat:     Mouth: Mucous membranes are moist.     Pharynx: Oropharynx is clear. No oropharyngeal exudate or posterior oropharyngeal erythema.     Comments: Clear pnd   Eyes:     General:        Right eye: No discharge.        Left eye: No discharge.     Conjunctiva/sclera: Conjunctivae normal.     Pupils: Pupils are equal, round, and  reactive to light.  Neck:     Thyroid: No thyromegaly.     Vascular: No carotid bruit or JVD.  Cardiovascular:     Rate and Rhythm: Normal rate and regular rhythm.     Heart sounds: Normal heart sounds.     No gallop.  Pulmonary:     Effort: Pulmonary effort is normal. No respiratory distress.     Breath sounds: Normal breath sounds. No wheezing or rales.  Abdominal:     General: There is no distension or abdominal bruit.     Palpations: Abdomen is soft. There is no mass.     Tenderness: There is no abdominal tenderness. There is no guarding or rebound.     Hernia: No hernia is present.  Musculoskeletal:     Cervical back: Normal range of motion and neck supple. No tenderness.     Right lower leg: No edema.     Left lower leg: No edema.     Comments: Limited rom L knee and spine  Lymphadenopathy:     Cervical: No cervical adenopathy.  Skin:    General: Skin is warm and dry.     Coloration: Skin is not pale.     Findings: No rash.     Comments: Solar lentigines diffusely   Neurological:     Mental Status: She is alert.     Cranial Nerves: No cranial nerve deficit.     Motor: No weakness.     Coordination: Coordination normal.     Deep Tendon Reflexes: Reflexes are normal and  symmetric. Reflexes normal.  Psychiatric:        Mood and Affect: Mood normal.           Assessment & Plan:   Problem List Items Addressed This Visit       Cardiovascular and Mediastinum   Aortic atherosclerosis (HCC)    Noted incidentally on CT in 2023 Under care of cardiology  HTN is in good control  Taking asa 81 mg daily  Atorvastatin 40 mg daily      CAD (coronary artery disease), native coronary artery    Under care of cardiology  No clinical changes Has upcoming appt for ortho surgical clearance       Essential hypertension    bp in fair control at this time  BP Readings from Last 1 Encounters:  05/20/22 116/62  No changes needed Most recent labs reviewed  Disc lifstyle  change with low sodium diet and exercise  Losartan 100 mg daily         Respiratory   Pulmonary nodule    Re checked CT chext 10/2021  Stable 0.5 mm nodule in RML- no f/u recommended       Sleep apnea    Pt has not had sleep study to confirm this   (had to cancel in face of emergency prior) She has no symptoms of fatigue or somnolence ? If this was cause of pulm HTN   Pt will d/w cardiology at appt next week       Viral URI with cough    Today suspect viral uri vs allergic rhinitis vs both  Reassuring exam Discussed symptom care-see AVS Rev ER precautions Tessalon sent to pharmacy along with zyrtec and flonase Update if not starting to improve in a week or if worsening          Other   Hyperlipidemia    In setting of CAD and aortic atherosclerosis  Disc goals for lipids and reasons to control them Rev last labs with pt Rev low sat fat diet in detail Labs today  Takes atorvastatin 40 mg daily       Pre-operative general physical examination - Primary    No medical concerns for upcoming TKR this summer  Pt will have cardiac clearance next week for CAD and ? Of OSA  Physically fit  No significant drug allergies No h/o coag issues  Will have to hold aleve at least 7 d prior to surgery  Asa low dose will be up to cardiology  If she needs labs (pending forms) will come back closer to surgical date

## 2022-05-20 NOTE — Assessment & Plan Note (Addendum)
In setting of CAD and aortic atherosclerosis  Disc goals for lipids and reasons to control them Rev last labs with pt Rev low sat fat diet in detail Labs today  Takes atorvastatin 40 mg daily

## 2022-05-20 NOTE — Assessment & Plan Note (Signed)
No medical concerns for upcoming TKR this summer  Pt will have cardiac clearance next week for CAD and ? Of OSA  Physically fit  No significant drug allergies No h/o coag issues  Will have to hold aleve at least 7 d prior to surgery  Asa low dose will be up to cardiology  If she needs labs (pending forms) will come back closer to surgical date

## 2022-05-20 NOTE — Assessment & Plan Note (Signed)
Today suspect viral uri vs allergic rhinitis vs both  Reassuring exam Discussed symptom care-see AVS Rev ER precautions Tessalon sent to pharmacy along with zyrtec and flonase Update if not starting to improve in a week or if worsening

## 2022-05-20 NOTE — Assessment & Plan Note (Signed)
bp in fair control at this time  BP Readings from Last 1 Encounters:  05/20/22 116/62   No changes needed Most recent labs reviewed  Disc lifstyle change with low sodium diet and exercise  Losartan 100 mg daily

## 2022-05-20 NOTE — Patient Instructions (Addendum)
Talk to cardiology about ? Sleep test before your surgery   You may have a viral cold or allergy symptoms or both   Get back on flonase daily for next 2-3 weeks  Zyrtec 10 mg daily   Try the tessalon for cough  Watch for wheezing or more productive cough or other fever   Update if not starting to improve in a week or if worsening    Drink fluids and rest   Closer to the surgery date we can do labs if needed

## 2022-05-20 NOTE — Assessment & Plan Note (Signed)
Pt has not had sleep study to confirm this   (had to cancel in face of emergency prior) She has no symptoms of fatigue or somnolence ? If this was cause of pulm HTN   Pt will d/w cardiology at appt next week

## 2022-05-20 NOTE — Assessment & Plan Note (Signed)
Re checked CT chext 10/2021  Stable 0.5 mm nodule in RML- no f/u recommended

## 2022-05-20 NOTE — Assessment & Plan Note (Signed)
Under care of cardiology  No clinical changes Has upcoming appt for ortho surgical clearance

## 2022-05-20 NOTE — Assessment & Plan Note (Signed)
Noted incidentally on CT in 2023 Under care of cardiology  HTN is in good control  Taking asa 81 mg daily  Atorvastatin 40 mg daily

## 2022-05-25 ENCOUNTER — Encounter: Payer: Self-pay | Admitting: Cardiology

## 2022-05-25 ENCOUNTER — Ambulatory Visit: Payer: Medicare Other | Attending: Cardiology | Admitting: Cardiology

## 2022-05-25 VITALS — BP 117/69 | HR 68 | Ht 61.0 in | Wt 144.8 lb

## 2022-05-25 DIAGNOSIS — I2510 Atherosclerotic heart disease of native coronary artery without angina pectoris: Secondary | ICD-10-CM

## 2022-05-25 DIAGNOSIS — I10 Essential (primary) hypertension: Secondary | ICD-10-CM

## 2022-05-25 DIAGNOSIS — Z01818 Encounter for other preprocedural examination: Secondary | ICD-10-CM

## 2022-05-25 DIAGNOSIS — E7800 Pure hypercholesterolemia, unspecified: Secondary | ICD-10-CM

## 2022-05-25 NOTE — Progress Notes (Signed)
Date:  05/25/2022   ID:  JEANETT PERRY, DOB [birth year], MRN [id number]   PCP:  Judy Pimple, MD  Cardiology:  Armanda Magic, MD Electrophysiologist:  None   Chief Complaint:  HTN and fm hx of CAD  History of Present Illness:    Jamie Garza is a 67 y.o. female with a hx of HTN, HLD, fm hx of CAD and hx of OSA.  She has never smoked. When I last saw her she had initially presented for evaluation due to her brother having CAD as well as AS and her mother had aortic valve problems.  She had complained of intermittent chest pressure gut thought it was related to stress.  She underwent coronary CTA a year ago and showed a coronary Ca score of 242 and mild non-obstructive CAD with 25-49% prox LAD.  Coronary FFR was normal.  She was started on statin and ASA.    She is here today for followup and is doing well.  She is planning on getting knee replacement surgery this summer.  She denies any chest pain or pressure, SOB, DOE, PND, orthopnea, LE edema, dizziness, palpitations or syncope. She is compliant with her meds and is tolerating meds with no SE.    Prior CV studies:   The following studies were reviewed today:  EKG  Past Medical History:  Diagnosis Date   CAD (coronary artery disease), native coronary artery    coronary CTA with 25-49% prox LAD and coronary Ca score of 242 in 09/2018   Depression with anxiety 10/30/2011   Distal radius fracture, left 05/23/2011   Essential hypertension 06/14/2018   History of radius fracture 05/23/2011   Of note- nl dexa 2013-not a fragility fracture    Hyperlipidemia 09/02/2016   Insomnia    Pulmonary hypertension (HCC)    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 10/2018   SLEEP APNEA 01/04/2007   Qualifier: Diagnosis of  By: Jillyn Hidden FNP, Mcarthur Rossetti    Spinal stenosis, lumbar    L4 and L5 - severe; L3 and L4 - moderate   Stress reaction 07/18/2018   Wrist fracture 11/2019   right    Past Surgical History:  Procedure Laterality Date    CARPAL TUNNEL RELEASE Right 12/2018   COLONOSCOPY  08/2009   Jarold Motto  tic's   WRIST FRACTURE SURGERY  2013     Current Meds  Medication Sig   aspirin EC 81 MG tablet Take 1 tablet (81 mg total) by mouth daily.   atorvastatin (LIPITOR) 40 MG tablet Take 1 tablet (40 mg total) by mouth daily.   benzonatate (TESSALON) 200 MG capsule Take 1 capsule (200 mg total) by mouth 3 (three) times daily as needed for cough. Do not bite pill   betamethasone dipropionate 0.05 % lotion Apply topically.   Ca Carbonate-Mag Hydroxide (ROLAIDS EXTRA STRENGTH PO) Take by mouth. Takes for Calcium supplement   cetirizine (ZYRTEC) 10 MG tablet Take 1 tablet (10 mg total) by mouth at bedtime.   fluticasone (FLONASE) 50 MCG/ACT nasal spray Place 2 sprays into both nostrils daily.   losartan (COZAAR) 100 MG tablet Take 1 tablet (100 mg total) by mouth daily.   naproxen sodium (ALEVE) 220 MG tablet Take 220 mg by mouth at bedtime as needed.    traZODone (DESYREL) 50 MG tablet Take 1 tablet (50 mg total) by mouth at bedtime.   TURMERIC PO Take 2,000 mg by mouth daily.   valACYclovir (VALTREX) 1000 MG tablet TAKE  TWO TABLETS BY MOUTH EVERY TWELVE HOURS FOR ONE DAY FOR FEVER BLISTER/COLD SORE     Allergies:   Patient has no known allergies.   Social History   Tobacco Use   Smoking status: Never   Smokeless tobacco: Never  Vaping Use   Vaping Use: Never used  Substance Use Topics   Alcohol use: Yes    Comment: occasional   Drug use: No     Family Hx: The patient's family history includes Aortic stenosis in her brother and mother; CAD in her brother; Diabetes in her maternal aunt, maternal grandfather, mother, and paternal grandfather. There is no history of Breast cancer, Colon cancer, Colon polyps, Esophageal cancer, Rectal cancer, or Stomach cancer.  ROS:   Please see the history of present illness.     All other systems reviewed and are negative.   Labs/Other Tests and Data Reviewed:    Recent  Labs: 10/23/2021: ALT 25; BUN 17; Creatinine, Ser 0.67; Hemoglobin 12.8; Platelets 176.0; Potassium 3.8; Sodium 140; TSH 2.00   Recent Lipid Panel Lab Results  Component Value Date/Time   CHOL 115 10/23/2021 08:57 AM   CHOL 139 07/16/2021 07:51 AM   TRIG 70.0 10/23/2021 08:57 AM   HDL 47.00 10/23/2021 08:57 AM   HDL 58 07/16/2021 07:51 AM   CHOLHDL 2 10/23/2021 08:57 AM   LDLCALC 54 10/23/2021 08:57 AM   LDLCALC 66 07/16/2021 07:51 AM   LDLDIRECT 137.8 12/03/2009 08:54 AM    Wt Readings from Last 3 Encounters:  05/25/22 144 lb 12.8 oz (65.7 kg)  05/20/22 144 lb 6 oz (65.5 kg)  10/23/21 142 lb 6 oz (64.6 kg)     Objective:    Vital Signs:  BP 117/69   Pulse 68   Ht 5\' 1"  (1.549 m)   Wt 144 lb 12.8 oz (65.7 kg)   SpO2 97%   BMI 27.36 kg/m    GEN: Well nourished, well developed in no acute distress HEENT: Normal NECK: No JVD; No carotid bruits LYMPHATICS: No lymphadenopathy CARDIAC:RRR, no murmurs, rubs, gallops RESPIRATORY:  Clear to auscultation without rales, wheezing or rhonchi  ABDOMEN: Soft, non-tender, non-distended MUSCULOSKELETAL:  No edema; No deformity  SKIN: Warm and dry NEUROLOGIC:  Alert and oriented x 3 PSYCHIATRIC:  Normal affect   EKG was performed today and demonstrates NSR with iRBBB, anterior infarct age undetermined   ASSESSMENT & PLAN:    1.  ASCAD -nonobstructive with 25-49% prox LAD on coronary CTA 09/2018 -she has not had any anginal symptoms since I saw her last -EKG shows iRBBB with possible anterior infarct but unchanged from 09/2019 -check 2D echo to make sure LVF is normal- it has been 5 years since last echo -Continue prescription drug therapy with Aspirin 81 mg daily, atorvastatin 40 mg daily with as needed refills   2.  Hypertension  -BP is well controlled on exam today -Continue drug management with losartan 100 mg daily with as needed refills -I have personally reviewed and interpreted outside labs serum creatinine performed by  patient's PCP which showed serum creatinine 0.67 and potassium 3.8 on 10/23/2021  3.  Hyperlipidemia  -LDL goal < 70 -I have personally reviewed and interpreted outside labs performed by patient's PCP which showed LDL 54 and HDL 47 on 10/23/2021 -Continue prescription drug management with atorvastatin 40 mg daily with as needed refills  4.  Preoperative cardiovascular examination Low perioperative risk of a major cardiac event is 0.9% according to the Revised Cardiac Risk Index (RCRI).  Therefore, the patient is at low risk for perioperative complications.   The patient's  functional capacity is normal at 6.36 METs according to the Duke Activity Status Index (DASI). Recommendations: According to ACC/AHA guidelines, no further cardiovascular testing needed.  The patient may proceed to surgery at acceptable risk.    Medication Adjustments/Labs and Tests Ordered: Current medicines are reviewed at length with the patient today.  Concerns regarding medicines are outlined above.  Tests Ordered: Orders Placed This Encounter  Procedures   EKG 12-Lead   Medication Changes: No orders of the defined types were placed in this encounter.   Disposition:  Follow up 1 year  Signed, Armanda Magic, MD  05/25/2022 3:52 PM    [facility]

## 2022-05-25 NOTE — Addendum Note (Signed)
Addended by: Luellen Pucker on: 05/25/2022 04:01 PM   Modules accepted: Orders

## 2022-05-25 NOTE — Patient Instructions (Signed)
Medication Instructions:  Your physician recommends that you continue on your current medications as directed. Please refer to the Current Medication list given to you today.  *If you need a refill on your cardiac medications before your next appointment, please call your pharmacy*   Lab Work: None.  If you have labs (blood work) drawn today and your tests are completely normal, you will receive your results only by: MyChart Message (if you have MyChart) OR A paper copy in the mail If you have any lab test that is abnormal or we need to change your treatment, we will call you to review the results.   Testing/Procedures: Your physician has requested that you have an echocardiogram. Echocardiography is a painless test that uses sound waves to create images of your heart. It provides your doctor with information about the size and shape of your heart and how well your heart's chambers and valves are working. This procedure takes approximately one hour. There are no restrictions for this procedure. Please do NOT wear cologne, perfume, aftershave, or lotions (deodorant is allowed). Please arrive 15 minutes prior to your appointment time.    Follow-Up: At [facility], you and your health needs are our priority.  As part of our continuing mission to provide you with exceptional heart care, we have created designated Provider Care Teams.  These Care Teams include your primary Cardiologist (physician) and Advanced Practice Providers (APPs -  Physician Assistants and Nurse Practitioners) who all work together to provide you with the care you need, when you need it.  We recommend signing up for the patient portal called "MyChart".  Sign up information is provided on this After Visit Summary.  MyChart is used to connect with patients for Virtual Visits (Telemedicine).  Patients are able to view lab/test results, encounter notes, upcoming appointments, etc.  Non-urgent messages can be sent to  your provider as well.   To learn more about what you can do with MyChart, go to [email or web address].    Your next appointment:   1 year(s)  Provider:   Traci Turner, MD     

## 2022-05-27 NOTE — Telephone Encounter (Signed)
Patient is calling to get clarification on issues that were found at the surgical center in patients chart in regards to sleep apnea and hypertension. Requesting return call.

## 2022-05-29 ENCOUNTER — Encounter: Payer: Self-pay | Admitting: Cardiology

## 2022-05-29 NOTE — Telephone Encounter (Signed)
Patient wrote in on My Chart w/ the following questions:  "Delbert Harness has scheduled my [facility] for [facility]. In looking further into my file, they have a question regarding notes involving hypertension/sleep apnea. I have never felt that I have sleep apnea however they want more information from your office regarding the issue as to whether I have to have a sleep test to decide the matter. Please contact me or their office as soon as you get a chance since I will be unable to preregister at the Surgical Center until it is decided and whether I will have to go to the hospital instead for surgery."  I gave patient my direct line and asked her to have surgical center call me so I can try and determine what they are needing for surgical clearance.

## 2022-06-11 ENCOUNTER — Ambulatory Visit (INDEPENDENT_AMBULATORY_CARE_PROVIDER_SITE_OTHER): Payer: Medicare Other

## 2022-06-11 VITALS — Ht 61.75 in | Wt 144.0 lb

## 2022-06-11 DIAGNOSIS — Z780 Asymptomatic menopausal state: Secondary | ICD-10-CM

## 2022-06-11 DIAGNOSIS — Z1231 Encounter for screening mammogram for malignant neoplasm of breast: Secondary | ICD-10-CM | POA: Diagnosis not present

## 2022-06-11 DIAGNOSIS — Z0000 Encounter for general adult medical examination without abnormal findings: Secondary | ICD-10-CM | POA: Diagnosis not present

## 2022-06-11 NOTE — Progress Notes (Signed)
I connected with  Jamie Garza on 06/11/22 by a audio enabled telemedicine application and verified that I am speaking with the correct person using two identifiers.  Patient Location: Other:  work  [employer] administrator  I discussed the limitations of evaluation and management by telemedicine. The patient expressed understanding and agreed to proceed.  Subjective:   Jamie Garza is a 67 y.o. female who presents for Medicare Annual (Subsequent) preventive examination.  Review of Systems      Cardiac Risk Factors include: advanced age (>10men, >2 women);hypertension     Objective:    Today's Vitals   06/11/22 0822  Weight: 144 lb (65.3 kg)  Height: 5' 1.75" (1.568 m)   Body mass index is 26.55 kg/m.     06/11/2022    8:35 AM 05/27/2021   10:42 AM 05/23/2011    7:43 AM  Advanced Directives  Does Patient Have a Medical Advance Directive? Yes Yes Patient would not like information  Type of Advance Directive Healthcare Power of [place];Living will Healthcare Power of [place];Living will   Does patient want to make changes to medical advance directive?  No - Patient declined   Copy of Healthcare Power of Attorney in Chart? No - copy requested No - copy requested     Current Medications (verified) Outpatient Encounter Medications as of 06/11/2022  Medication Sig   aspirin EC 81 MG tablet Take 1 tablet (81 mg total) by mouth daily.   atorvastatin (LIPITOR) 40 MG tablet Take 1 tablet (40 mg total) by mouth daily.   benzonatate (TESSALON) 200 MG capsule Take 1 capsule (200 mg total) by mouth 3 (three) times daily as needed for cough. Do not bite pill   betamethasone dipropionate 0.05 % lotion Apply topically.   Ca Carbonate-Mag Hydroxide (ROLAIDS EXTRA STRENGTH PO) Take by mouth. Takes for Calcium supplement   cetirizine (ZYRTEC) 10 MG tablet Take 1 tablet (10 mg total) by mouth at bedtime.   fluticasone (FLONASE) 50 MCG/ACT nasal spray Place 2 sprays into both nostrils  daily.   losartan (COZAAR) 100 MG tablet Take 1 tablet (100 mg total) by mouth daily.   naproxen sodium (ALEVE) 220 MG tablet Take 220 mg by mouth at bedtime as needed.    traZODone (DESYREL) 50 MG tablet Take 1 tablet (50 mg total) by mouth at bedtime.   TURMERIC PO Take 2,000 mg by mouth daily.   valACYclovir (VALTREX) 1000 MG tablet TAKE TWO TABLETS BY MOUTH EVERY TWELVE HOURS FOR ONE DAY FOR FEVER BLISTER/COLD SORE   LYSINE HCL PO Take 1 tablet by mouth once a week. (Patient not taking: Reported on 05/25/2022)   No facility-administered encounter medications on file as of 06/11/2022.    Allergies (verified) Patient has no known allergies.   History: Past Medical History:  Diagnosis Date   CAD (coronary artery disease), native coronary artery    coronary CTA with 25-49% prox LAD and coronary Ca score of 242 in 09/2018   Depression with anxiety 10/30/2011   Distal radius fracture, left 05/23/2011   Essential hypertension 06/14/2018   History of radius fracture 05/23/2011   Of note- nl dexa 2013-not a fragility fracture    Hyperlipidemia 09/02/2016   Insomnia    Pulmonary hypertension (HCC)    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 10/2018   SLEEP APNEA 01/04/2007   Qualifier: Diagnosis of  By: Jillyn Hidden FNP, Mcarthur Rossetti    Spinal stenosis, lumbar    L4 and L5 -  severe; L3 and L4 - moderate   Stress reaction 07/18/2018   Wrist fracture 11/2019   right    Past Surgical History:  Procedure Laterality Date   CARPAL TUNNEL RELEASE Right 12/2018   COLONOSCOPY  08/2009   Jarold Motto  tic's   WRIST FRACTURE SURGERY  2013   Family History  Problem Relation Age of Onset   Diabetes Mother        borderline DM   Aortic stenosis Mother    Diabetes Maternal Aunt    Diabetes Maternal Grandfather    Diabetes Paternal Grandfather    CAD Brother    Aortic stenosis Brother    Breast cancer Neg Hx    Colon cancer Neg Hx    Colon polyps Neg Hx    Esophageal cancer Neg Hx    Rectal  cancer Neg Hx    Stomach cancer Neg Hx    Social History   Socioeconomic History   Marital status: Married    Spouse name: Not on file   Number of children: Not on file   Years of education: Not on file   Highest education level: 12th grade  Occupational History   Not on file  Tobacco Use   Smoking status: Never   Smokeless tobacco: Never  Vaping Use   Vaping Use: Never used  Substance and Sexual Activity   Alcohol use: Yes    Comment: occasional   Drug use: No   Sexual activity: Not on file  Other Topics Concern   Not on file  Social History Narrative   ** Merged History Encounter **       Social Determinants of Health   Financial Resource Strain: Low Risk  (06/11/2022)   Overall Financial Resource Strain (CARDIA)    Difficulty of Paying Living Expenses: Not hard at all  Food Insecurity: No Food Insecurity (06/11/2022)   Hunger Vital Sign    Worried About Running Out of Food in the Last Year: Never true    Ran Out of Food in the Last Year: Never true  Transportation Needs: No Transportation Needs (06/11/2022)   PRAPARE - Administrator, Civil Service (Medical): No    Lack of Transportation (Non-Medical): No  Physical Activity: Sufficiently Active (06/11/2022)   Exercise Vital Sign    Days of Exercise per Week: 7 days    Minutes of Exercise per Session: 30 min  Stress: No Stress Concern Present (06/11/2022)   [employer] of Occupational Health - Occupational Stress Questionnaire    Feeling of Stress : Not at all  Social Connections: Moderately Integrated (06/11/2022)   Social Connection and Isolation Panel [NHANES]    Frequency of Communication with Friends and Family: More than three times a week    Frequency of Social Gatherings with Friends and Family: More than three times a week    Attends Religious Services: More than 4 times per year    Active Member of [employer] or Organizations: No    Attends Engineer, structural: Never    Marital  Status: Married    Tobacco Counseling Counseling given: Not Answered   Clinical Intake:  Pre-visit preparation completed: Yes  Pain : No/denies pain     Nutritional Risks: None Diabetes: No  How often do you need to have someone help you when you read instructions, pamphlets, or other written materials from your doctor or pharmacy?: 1 - Never  Diabetic? no  Interpreter Needed?: No  Information entered by :: C.Deaire Mcwhirter LPN  Activities of Daily Living    06/11/2022    8:36 AM  In your present state of health, do you have any difficulty performing the following activities:  Hearing? 0  Vision? 0  Difficulty concentrating or making decisions? 0  Walking or climbing stairs? 0  Dressing or bathing? 0  Doing errands, shopping? 0  Preparing Food and eating ? N  Using the Toilet? N  In the past six months, have you accidently leaked urine? N  Do you have problems with loss of bowel control? N  Managing your Medications? N  Managing your Finances? N  Housekeeping or managing your Housekeeping? N    Patient Care Team: Tower, Audrie Gallus, MD as PCP - General Quintella Reichert, MD as PCP - Cardiology (Cardiology)  Indicate any recent Medical Services you may have received from other than [facility] providers in the past year (date may be approximate).     Assessment:   This is a routine wellness examination for Leanny.  Hearing/Vision screen Hearing Screening - Comments:: No aids- no hearing issues Vision Screening - Comments:: Glasses - Hoyt Koch  Dietary issues and exercise activities discussed: Current Exercise Habits: Home exercise routine, Type of exercise: walking;treadmill;strength training/weights, Time (Minutes): 30, Frequency (Times/Week): 7, Weekly Exercise (Minutes/Week): 210, Intensity: Moderate, Exercise limited by: None identified   Goals Addressed             This Visit's Progress    Patient Stated       Lose 10 pounds.       Depression Screen     06/11/2022    8:35 AM 05/20/2022    4:07 PM 05/27/2021   10:37 AM 10/21/2020    8:35 AM 09/11/2019    9:51 AM 09/09/2018    3:19 PM 06/14/2018   10:45 AM  PHQ 2/9 Scores  PHQ - 2 Score 0 0 0 0 0 0 0  PHQ- 9 Score 0 1   0      Fall Risk    06/11/2022    8:36 AM 05/20/2022    4:06 PM 05/27/2021   10:40 AM  Fall Risk   Falls in the past year? 0 0 0  Number falls in past yr: 0 0 0  Injury with Fall? 0 0 0  Risk for fall due to : No Fall Risks No Fall Risks No Fall Risks  Follow up Falls prevention discussed;Falls evaluation completed Falls evaluation completed     FALL RISK PREVENTION PERTAINING TO THE HOME:  Any stairs in or around the home? Yes  If so, are there any without handrails? No  Home free of loose throw rugs in walkways, pet beds, electrical cords, etc? Yes  Adequate lighting in your home to reduce risk of falls? Yes   ASSISTIVE DEVICES UTILIZED TO PREVENT FALLS:  Life alert? No  Use of a cane, walker or w/c? No  Grab bars in the bathroom? No  Shower chair or bench in shower? Yes  Elevated toilet seat or a handicapped toilet? No    Cognitive Function:        06/11/2022    8:37 AM 05/27/2021   10:43 AM  6CIT Screen  What Year? 0 points 0 points  What month? 0 points 0 points  What time? 0 points 0 points  Count back from 20 0 points 0 points  Months in reverse 0 points 0 points  Repeat phrase 0 points 0 points  Total Score 0 points 0 points  Immunizations Immunization History  Administered Date(s) Administered   COVID-19, mRNA, vaccine(Comirnaty)12 years and older 10/26/2021   Fluad Quad(high Dose 65+) 10/21/2020, 10/23/2021   Influenza Whole 11/20/2008   Influenza,inj,Quad PF,6+ Mos 10/21/2015, 10/22/2016, 11/01/2018   Influenza-Unspecified 10/20/2013, 09/19/2017   PFIZER(Purple Top)SARS-COV-2 Vaccination 03/14/2019, 04/06/2019   PNEUMOCOCCAL CONJUGATE-20 10/21/2020   Pfizer Covid-19 Vaccine Bivalent Booster 31yrs & up 08/09/2020   Td 11/20/2008   Tdap  11/01/2018   Zoster Recombinat (Shingrix) 08/30/2018, 12/20/2018   Zoster, Live 10/28/2015    TDAP status: Up to date  Flu Vaccine status: Up to date  Pneumococcal vaccine status: Up to date  Covid-19 vaccine status: Completed vaccines  Qualifies for Shingles Vaccine? Yes   Zostavax completed No   Shingrix Completed?: Yes  Screening Tests Health Maintenance  Topic Date Due   Hepatitis C Screening  03/16/2023 (Originally 10/12/1973)   INFLUENZA VACCINE  08/20/2022   MAMMOGRAM  11/11/2022   Medicare Annual Wellness (AWV)  06/11/2023   DTaP/Tdap/Td (3 - Td or Tdap) 10/31/2028   COLONOSCOPY (Pts 45-39yrs Insurance coverage will need to be confirmed)  06/27/2030   Pneumonia Vaccine 98+ Years old  Completed   DEXA SCAN  Completed   COVID-19 Vaccine  Completed   Zoster Vaccines- Shingrix  Completed   HPV VACCINES  Aged Out    Health Maintenance  There are no preventive care reminders to display for this patient.   Colorectal cancer screening: Type of screening: Colonoscopy. Completed 06/26/20. Repeat every 10 years  Mammogram status: Completed 11/10/21. Repeat every year  Bone Density status: Completed 11/06/20. Results reflect: Bone density results: OSTEOPENIA. Repeat every 2 years.  Lung Cancer Screening: (Low Dose CT Chest recommended if Age 61-80 years, 30 pack-year currently smoking OR have quit w/in 15years.) does not qualify.   Lung Cancer Screening Referral: no  Additional Screening:  Hepatitis C Screening: does qualify; Completed Due at next visit  Vision Screening: Recommended annual ophthalmology exams for early detection of glaucoma and other disorders of the eye. Is the patient up to date with their annual eye exam?  Yes  Who is the provider or what is the name of the office in which the patient attends annual eye exams? [facility] If pt is not established with a provider, would they like to be referred to a provider to establish care? Yes .   Dental  Screening: Recommended annual dental exams for proper oral hygiene  Community Resource Referral / Chronic Care Management: CRR required this visit?  No   CCM required this visit?  No      Plan:     I have personally reviewed and noted the following in the patient's chart:   Medical and social history Use of alcohol, tobacco or illicit drugs  Current medications and supplements including opioid prescriptions. Patient is not currently taking opioid prescriptions. Functional ability and status Nutritional status Physical activity Advanced directives List of other physicians Hospitalizations, surgeries, and ER visits in previous 12 months Vitals Screenings to include cognitive, depression, and falls Referrals and appointments  In addition, I have reviewed and discussed with patient certain preventive protocols, quality metrics, and best practice recommendations. A written personalized care plan for preventive services as well as general preventive health recommendations were provided to patient.     Maryan Puls, LPN   1/61/0960   Nurse Notes: no concerns. Orders placed for mammogram and dexa scan.

## 2022-06-11 NOTE — Patient Instructions (Signed)
Jamie Garza , Thank you for taking time to come for your Medicare Wellness Visit. I appreciate your ongoing commitment to your health goals. Please review the following plan we discussed and let me know if I can assist you in the future.   These are the goals we discussed:  Goals       Maintain physical activity (pt-stated)      I would  like to lose about 5-10lbs      Patient Stated      Lose 10 pounds.        This is a list of the screening recommended for you and due dates:  Health Maintenance  Topic Date Due   Hepatitis C Screening: USPSTF Recommendation to screen - Ages 18-79 yo.  03/16/2023*   Flu Shot  08/20/2022   Mammogram  11/11/2022   Medicare Annual Wellness Visit  06/11/2023   DTaP/Tdap/Td vaccine (3 - Td or Tdap) 10/31/2028   Colon Cancer Screening  06/27/2030   Pneumonia Vaccine  Completed   DEXA scan (bone density measurement)  Completed   COVID-19 Vaccine  Completed   Zoster (Shingles) Vaccine  Completed   HPV Vaccine  Aged Out  *Topic was postponed. The date shown is not the original due date.    Advanced directives: Please bring a copy of your health care power of attorney and living will to the office to be added to your chart at your convenience.   Conditions/risks identified: none  Next appointment: Follow up in one year for your annual wellness visit 06/15/23 @ 1:30 Televisit   Preventive Care 65 Years and Older, Female Preventive care refers to lifestyle choices and visits with your health care provider that can promote health and wellness. What does preventive care include? A yearly physical exam. This is also called an annual well check. Dental exams once or twice a year. Routine eye exams. Ask your health care provider how often you should have your eyes checked. Personal lifestyle choices, including: Daily care of your teeth and gums. Regular physical activity. Eating a healthy diet. Avoiding tobacco and drug use. Limiting alcohol  use. Practicing safe sex. Taking low-dose aspirin every day. Taking vitamin and mineral supplements as recommended by your health care provider. What happens during an annual well check? The services and screenings done by your health care provider during your annual well check will depend on your age, overall health, lifestyle risk factors, and family history of disease. Counseling  Your health care provider may ask you questions about your: Alcohol use. Tobacco use. Drug use. Emotional well-being. Home and relationship well-being. Sexual activity. Eating habits. History of falls. Memory and ability to understand (cognition). Work and work Astronomer. Reproductive health. Screening  You may have the following tests or measurements: Height, weight, and BMI. Blood pressure. Lipid and cholesterol levels. These may be checked every 5 years, or more frequently if you are over 41 years old. Skin check. Lung cancer screening. You may have this screening every year starting at age 57 if you have a 30-pack-year history of smoking and currently smoke or have quit within the past 15 years. Fecal occult blood test (FOBT) of the stool. You may have this test every year starting at age 84. Flexible sigmoidoscopy or colonoscopy. You may have a sigmoidoscopy every 5 years or a colonoscopy every 10 years starting at age 74. Hepatitis C blood test. Hepatitis B blood test. Sexually transmitted disease (STD) testing. Diabetes screening. This is done by checking your blood  sugar (glucose) after you have not eaten for a while (fasting). You may have this done every 1-3 years. Bone density scan. This is done to screen for osteoporosis. You may have this done starting at age 94. Mammogram. This may be done every 1-2 years. Talk to your health care provider about how often you should have regular mammograms. Talk with your health care provider about your test results, treatment options, and if necessary,  the need for more tests. Vaccines  Your health care provider may recommend certain vaccines, such as: Influenza vaccine. This is recommended every year. Tetanus, diphtheria, and acellular pertussis (Tdap, Td) vaccine. You may need a Td booster every 10 years. Zoster vaccine. You may need this after age 27. Pneumococcal 13-valent conjugate (PCV13) vaccine. One dose is recommended after age 24. Pneumococcal polysaccharide (PPSV23) vaccine. One dose is recommended after age 42. Talk to your health care provider about which screenings and vaccines you need and how often you need them. This information is not intended to replace advice given to you by your health care provider. Make sure you discuss any questions you have with your health care provider. Document Released: 02/01/2015 Document Revised: 09/25/2015 Document Reviewed: 11/06/2014 Elsevier Interactive Patient Education  2017 [employer].  Fall Prevention in the Home Falls can cause injuries. They can happen to people of all ages. There are many things you can do to make your home safe and to help prevent falls. What can I do on the outside of my home? Regularly fix the edges of walkways and driveways and fix any cracks. Remove anything that might make you trip as you walk through a door, such as a raised step or threshold. Trim any bushes or trees on the path to your home. Use bright outdoor lighting. Clear any walking paths of anything that might make someone trip, such as rocks or tools. Regularly check to see if handrails are loose or broken. Make sure that both sides of any steps have handrails. Any raised decks and porches should have guardrails on the edges. Have any leaves, snow, or ice cleared regularly. Use sand or salt on walking paths during winter. Clean up any spills in your garage right away. This includes oil or grease spills. What can I do in the bathroom? Use night lights. Install grab bars by the toilet and in the  tub and shower. Do not use towel bars as grab bars. Use non-skid mats or decals in the tub or shower. If you need to sit down in the shower, use a plastic, non-slip stool. Keep the floor dry. Clean up any water that spills on the floor as soon as it happens. Remove soap buildup in the tub or shower regularly. Attach bath mats securely with double-sided non-slip rug tape. Do not have throw rugs and other things on the floor that can make you trip. What can I do in the bedroom? Use night lights. Make sure that you have a light by your bed that is easy to reach. Do not use any sheets or blankets that are too big for your bed. They should not hang down onto the floor. Have a firm chair that has side arms. You can use this for support while you get dressed. Do not have throw rugs and other things on the floor that can make you trip. What can I do in the kitchen? Clean up any spills right away. Avoid walking on wet floors. Keep items that you use a lot in easy-to-reach  places. If you need to reach something above you, use a strong step stool that has a grab bar. Keep electrical cords out of the way. Do not use floor polish or wax that makes floors slippery. If you must use wax, use non-skid floor wax. Do not have throw rugs and other things on the floor that can make you trip. What can I do with my stairs? Do not leave any items on the stairs. Make sure that there are handrails on both sides of the stairs and use them. Fix handrails that are broken or loose. Make sure that handrails are as long as the stairways. Check any carpeting to make sure that it is firmly attached to the stairs. Fix any carpet that is loose or worn. Avoid having throw rugs at the top or bottom of the stairs. If you do have throw rugs, attach them to the floor with carpet tape. Make sure that you have a light switch at the top of the stairs and the bottom of the stairs. If you do not have them, ask someone to add them for  you. What else can I do to help prevent falls? Wear shoes that: Do not have high heels. Have rubber bottoms. Are comfortable and fit you well. Are closed at the toe. Do not wear sandals. If you use a stepladder: Make sure that it is fully opened. Do not climb a closed stepladder. Make sure that both sides of the stepladder are locked into place. Ask someone to hold it for you, if possible. Clearly mark and make sure that you can see: Any grab bars or handrails. First and last steps. Where the edge of each step is. Use tools that help you move around (mobility aids) if they are needed. These include: Canes. Walkers. Scooters. Crutches. Turn on the lights when you go into a dark area. Replace any light bulbs as soon as they burn out. Set up your furniture so you have a clear path. Avoid moving your furniture around. If any of your floors are uneven, fix them. If there are any pets around you, be aware of where they are. Review your medicines with your doctor. Some medicines can make you feel dizzy. This can increase your chance of falling. Ask your doctor what other things that you can do to help prevent falls. This information is not intended to replace advice given to you by your health care provider. Make sure you discuss any questions you have with your health care provider. Document Released: 11/01/2008 Document Revised: 06/13/2015 Document Reviewed: 02/09/2014 Elsevier Interactive Patient Education  2017 [employer].

## 2022-06-22 ENCOUNTER — Other Ambulatory Visit: Payer: Self-pay | Admitting: Cardiology

## 2022-06-22 ENCOUNTER — Other Ambulatory Visit: Payer: Self-pay | Admitting: Family Medicine

## 2022-06-22 NOTE — Telephone Encounter (Signed)
LAST APPOINTMENT DATE: 05/20/2022   NEXT APPOINTMENT DATE: 10/26/2022    LAST REFILL:07/09/2021  QTY: #90 w/ 3 refills

## 2022-06-25 ENCOUNTER — Ambulatory Visit (HOSPITAL_COMMUNITY): Payer: Medicare Other | Attending: Internal Medicine

## 2022-06-25 ENCOUNTER — Telehealth: Payer: Self-pay

## 2022-06-25 DIAGNOSIS — I071 Rheumatic tricuspid insufficiency: Secondary | ICD-10-CM

## 2022-06-25 DIAGNOSIS — I2510 Atherosclerotic heart disease of native coronary artery without angina pectoris: Secondary | ICD-10-CM | POA: Insufficient documentation

## 2022-06-25 LAB — ECHOCARDIOGRAM COMPLETE
Area-P 1/2: 3.72 cm2
S' Lateral: 2.9 cm

## 2022-06-25 NOTE — Telephone Encounter (Signed)
-----   Message from Quintella Reichert, MD sent at 06/25/2022  3:09 PM EDT ----- Echo showed great heart function with increased stiffness of heart muscle called diastolic dysfunction which is normal for age.  Midly enlarged left atrium and moderate to severe leakiness of TV.  There is very mild elevated BP in lungs called pulmonary HTN which has improved from prior echo. I want to repeat limited echo focusing on the TV and the leakiness.  I think there was too much gain making TR look worse than it really it.

## 2022-06-25 NOTE — Telephone Encounter (Signed)
Called patient to discuss echo results. Per Dr. Mayford Knife, echo showed great heart function with increased stiffness of heart muscle called diastolic dysfunction which is normal for age. Midly enlarged left atrium and moderate to severe leakiness of TV.  Mild elevated BP in lungs called pulmonary HTN which has improved from prior echo. Patient verbalizes understanding and agrees to repeat limited echo focusing on the TV. Order placed.

## 2022-06-29 NOTE — Telephone Encounter (Signed)
Pt calling in about getting another echo scheduled. I do not see another order for one, Please advise.

## 2022-06-30 ENCOUNTER — Encounter: Payer: Self-pay | Admitting: Cardiology

## 2022-06-30 ENCOUNTER — Other Ambulatory Visit: Payer: Self-pay

## 2022-06-30 DIAGNOSIS — I071 Rheumatic tricuspid insufficiency: Secondary | ICD-10-CM

## 2022-06-30 NOTE — Telephone Encounter (Signed)
I reinstated order and spoke with patient. Please clarify how soon patient needs repeat echo. Patient would like to discuss prior to scheduling as well.

## 2022-07-01 NOTE — Telephone Encounter (Signed)
-----   Message from Chanetta Marshall V sent at 07/01/2022  9:34 AM EDT ----- We have appts available on 7/1. We need an order.   Erie Noe ----- Message ----- From: Ethelda Chick, RN Sent: 07/01/2022   9:00 AM EDT To: Smitty Pluck,  This patient is having knee surgery on 08/04/22 and she is trying to get cleared for surgery. Dr. Mayford Knife is wanting to get a limited echo after seeing her complete echo. Can we get this scheduled before her surgery?  Thank you, Pam, RN

## 2022-07-01 NOTE — Telephone Encounter (Signed)
Called patient to let her know, per Dr. Mayford Knife, she can have surgery without getting echo first. Patient verbalized understanding, but she already has limited echo scheduled.

## 2022-07-01 NOTE — Telephone Encounter (Signed)
Called patient about her message. Patient is concerned about getting cleared for her surgery (08/04/22). Patient was wondering if she will need to cancel her surgery, could the limited echo wait until after her surgery, and will this leaky TV effect her clearance? Patient is agreeable to have the limited echo to check her TV, and I am going to send a message to echo department to get scheduled, hopefully before her surgery. Patient just wanted to be proactive if she needs to move her surgery date. Will forward to Dr. Mayford Knife for advisement.

## 2022-07-06 ENCOUNTER — Ambulatory Visit (HOSPITAL_COMMUNITY): Payer: Medicare Other | Attending: Cardiology

## 2022-07-06 ENCOUNTER — Encounter: Payer: Self-pay | Admitting: Cardiology

## 2022-07-06 DIAGNOSIS — I071 Rheumatic tricuspid insufficiency: Secondary | ICD-10-CM | POA: Diagnosis not present

## 2022-07-06 DIAGNOSIS — I361 Nonrheumatic tricuspid (valve) insufficiency: Secondary | ICD-10-CM | POA: Diagnosis not present

## 2022-07-06 LAB — ECHOCARDIOGRAM LIMITED
Area-P 1/2: 3.19 cm2
S' Lateral: 2.7 cm

## 2022-07-13 DIAGNOSIS — M25562 Pain in left knee: Secondary | ICD-10-CM | POA: Diagnosis not present

## 2022-07-15 NOTE — H&P (Signed)
KNEE ARTHROPLASTY ADMISSION H&P  Patient ID: Jamie Garza MRN: [id number] DOB/AGE: Nov 10, 1955 67 y.o.  Chief Complaint: left knee pain.  Planned Procedure Date: 08/04/22  Medical Clearance by Dr. Roxy Manns   Cardiac Clearance by Dr. Armanda Magic   HPI: Jamie Garza is a 67 y.o. female who presents for evaluation of OA LEFT KNEE. The patient has a history of pain and functional disability in the left knee due to arthritis and has failed non-surgical conservative treatments for greater than 12 weeks to include NSAID's and/or analgesics, corticosteriod injections, supervised PT with diminished ADL's post treatment, use of assistive devices, and activity modification.  Onset of symptoms was gradual, starting >10 years ago with gradually worsening course since that time. The patient noted no past surgery on the left knee.  Patient currently rates pain at 8 out of 10 with activity. Patient has night pain, worsening of pain with activity and weight bearing, and pain that interferes with activities of daily living.  Patient has evidence of subchondral sclerosis, periarticular osteophytes, and joint space narrowing by imaging studies.  There is no active infection.  Past Medical History:  Diagnosis Date   CAD (coronary artery disease), native coronary artery    coronary CTA with 25-49% prox LAD and coronary Ca score of 242 in 09/2018   Depression with anxiety 10/30/2011   Distal radius fracture, left 05/23/2011   Essential hypertension 06/14/2018   History of radius fracture 05/23/2011   Of note- nl dexa 2013-not a fragility fracture    Hyperlipidemia 09/02/2016   Insomnia    Pulmonary hypertension (HCC)    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 10/2018   SLEEP APNEA 01/04/2007   Qualifier: Diagnosis of  By: Jillyn Hidden FNP, Mcarthur Rossetti    Spinal stenosis, lumbar    L4 and L5 - severe; L3 and L4 - moderate   Stress reaction 07/18/2018   Tricuspid regurgitation    moderate by echo  06/2022   Wrist fracture 11/2019   right    Past Surgical History:  Procedure Laterality Date   CARPAL TUNNEL RELEASE Right 12/2018   COLONOSCOPY  08/2009   Jarold Motto  tic's   WRIST FRACTURE SURGERY  2013   No Known Allergies Prior to Admission medications   Medication Sig Start Date End Date Taking? Authorizing Provider  aspirin EC 81 MG tablet Take 1 tablet (81 mg total) by mouth daily. 09/28/18   Quintella Reichert, MD  atorvastatin (LIPITOR) 40 MG tablet Take 1 tablet (40 mg total) by mouth daily. 02/09/22   Quintella Reichert, MD  benzonatate (TESSALON) 200 MG capsule Take 1 capsule (200 mg total) by mouth 3 (three) times daily as needed for cough. Do not bite pill 05/20/22   Tower, Idamae Schuller A, MD  betamethasone dipropionate 0.05 % lotion Apply topically. 06/03/21   [provider]  Ca Carbonate-Mag Hydroxide (ROLAIDS EXTRA STRENGTH PO) Take by mouth. Takes for Calcium supplement    [provider]  cetirizine (ZYRTEC) 10 MG tablet Take 1 tablet (10 mg total) by mouth at bedtime. 05/20/22   Tower, Audrie Gallus, MD  fluticasone (FLONASE) 50 MCG/ACT nasal spray Place 2 sprays into both nostrils daily. 05/20/22   Tower, Audrie Gallus, MD  losartan (COZAAR) 100 MG tablet TAKE ONE TABLET BY MOUTH EVERY DAY 06/23/22   Quintella Reichert, MD  LYSINE HCL PO Take 1 tablet by mouth once a week. Patient not taking: Reported on 05/25/2022    [provider]  naproxen sodium (ALEVE) 220 MG tablet Take 220 mg by mouth at bedtime as needed.     [provider]  traZODone (DESYREL) 50 MG tablet TAKE ONE TABLET BY MOUTH AT BEDTIME 06/22/22   Tower, Audrie Gallus, MD  TURMERIC PO Take 2,000 mg by mouth daily.    [provider]  valACYclovir (VALTREX) 1000 MG tablet TAKE TWO TABLETS BY MOUTH EVERY TWELVE HOURS FOR ONE DAY FOR FEVER BLISTER/COLD SORE 01/30/22   Tower, Audrie Gallus, MD   Social History   Socioeconomic History   Marital status: Married    Spouse name: Not on file   Number of children: Not  on file   Years of education: Not on file   Highest education level: 12th grade  Occupational History   Not on file  Tobacco Use   Smoking status: Never   Smokeless tobacco: Never  Vaping Use   Vaping Use: Never used  Substance and Sexual Activity   Alcohol use: Yes    Comment: occasional   Drug use: No   Sexual activity: Not on file  Other Topics Concern   Not on file  Social History Narrative   ** Merged History Encounter **       Social Determinants of Health   Financial Resource Strain: Low Risk  (06/11/2022)   Overall Financial Resource Strain (CARDIA)    Difficulty of Paying Living Expenses: Not hard at all  Food Insecurity: No Food Insecurity (06/11/2022)   Hunger Vital Sign    Worried About Running Out of Food in the Last Year: Never true    Ran Out of Food in the Last Year: Never true  Transportation Needs: No Transportation Needs (06/11/2022)   PRAPARE - Administrator, Civil Service (Medical): No    Lack of Transportation (Non-Medical): No  Physical Activity: Sufficiently Active (06/11/2022)   Exercise Vital Sign    Days of Exercise per Week: 7 days    Minutes of Exercise per Session: 30 min  Stress: No Stress Concern Present (06/11/2022)   [employer] of Occupational Health - Occupational Stress Questionnaire    Feeling of Stress : Not at all  Social Connections: Moderately Integrated (06/11/2022)   Social Connection and Isolation Panel [NHANES]    Frequency of Communication with Friends and Family: More than three times a week    Frequency of Social Gatherings with Friends and Family: More than three times a week    Attends Religious Services: More than 4 times per year    Active Member of [employer] or Organizations: No    Attends Engineer, structural: Never    Marital Status: Married   Family History  Problem Relation Age of Onset   Diabetes Mother        borderline DM   Aortic stenosis Mother    Diabetes Maternal Aunt    Diabetes  Maternal Grandfather    Diabetes Paternal Grandfather    CAD Brother    Aortic stenosis Brother    Breast cancer Neg Hx    Colon cancer Neg Hx    Colon polyps Neg Hx    Esophageal cancer Neg Hx    Rectal cancer Neg Hx    Stomach cancer Neg Hx     ROS: Currently denies lightheadedness, dizziness, Fever, chills, CP, SOB.   No personal history of DVT, PE, MI, or CVA. No loose teeth or dentures All other systems have been reviewed and were otherwise currently negative with the  exception of those mentioned in the HPI and as above.  Objective: Vitals: Ht: 5'1" Wt: 142 lbs Temp: 98.4 BP: 122/73 Pulse: 62 O2 92% on room air.   Physical Exam: General: Alert, NAD.  Antalgic Gait  HEENT: EOMI, Good Neck Extension  Pulm: No increased work of breathing.  Clear B/L A/P w/o crackle or wheeze.  CV: RRR, with very slight holosystolic murmur heard at the left lower sternal border. No g/r appreciated  GI: soft, NT, ND. BS x 4 quadrants Neuro: CN II-XII grossly intact without focal deficit.  Sensation intact distally Skin: No lesions in the area of chief complaint MSK/Surgical Site:  + JLT. ROM 0-120 degrees.  5/5 strength in extension and flexion.  +EHL/FHL.  NVI.  Stable varus and valgus stress.    Imaging Review Plain radiographs demonstrate severe degenerative joint disease of the left knee.   The overall alignment issignificant varus. The bone quality appears to be fair for age and reported activity level.  Preoperative templating of the joint replacement has been completed, documented, and submitted to the Operating Room personnel in order to optimize intra-operative equipment management.  Assessment: OA LEFT KNEE Active Problems:   * No active hospital problems. *   Plan: Plan for Procedure(s): TOTAL KNEE ARTHROPLASTY  The patient history, physical exam, clinical judgement of the provider and imaging are consistent with end stage degenerative joint disease and total joint  arthroplasty is deemed medically necessary. The treatment options including medical management, injection therapy, and arthroplasty were discussed at length. The risks and benefits of Procedure(s): TOTAL KNEE ARTHROPLASTY were presented and reviewed.  The risks of nonoperative treatment, versus surgical intervention including but not limited to continued pain, aseptic loosening, stiffness, dislocation/subluxation, infection, bleeding, nerve injury, blood clots, cardiopulmonary complications, morbidity, mortality, among others were discussed. The patient verbalizes understanding and wishes to proceed with the plan.  Patient is being admitted for inpatient treatment for surgery, pain control, PT, prophylactic antibiotics, VTE prophylaxis, progressive ambulation, ADL's and discharge planning.   Dental prophylaxis discussed and recommended for 2 years postoperatively.  The patient does meet the criteria for TXA which will be used perioperatively.   ASA 81 mg BID will be used postoperatively for DVT prophylaxis in addition to SCDs, and early ambulation. Plan for Tylenol, Mobic, oxycodone for pain.   Robaxin for muscle spasms.   Zofran for nausea and vomiting. Senokot is for constipation prevention Pharmacy- [facility] The patient is planning to be discharged home with OPPT and into the care of her husband Margaretha Glassing who can be reached at [phone] Follow up appt 08/19/22 at 4:15pm     Marzetta Board Office [phone] 07/15/2022 6:03 PM

## 2022-07-21 NOTE — Patient Instructions (Signed)
DUE TO COVID-19 ONLY TWO VISITORS  (aged 68 and older)  ARE ALLOWED TO COME WITH YOU AND STAY IN THE WAITING ROOM ONLY DURING PRE OP AND PROCEDURE.   **NO VISITORS ARE ALLOWED IN THE SHORT STAY AREA OR RECOVERY ROOM!!**  IF YOU WILL BE ADMITTED INTO THE HOSPITAL YOU ARE ALLOWED ONLY FOUR SUPPORT PEOPLE DURING VISITATION HOURS ONLY (7 AM -8PM)   The support person(s) must pass our screening, gel in and out, and wear a mask at all times, including in the patient's room. Patients must also wear a mask when staff or their support person are in the room. Visitors GUEST BADGE MUST BE WORN VISIBLY  One adult visitor may remain with you overnight and MUST be in the room by 8 P.M.     Your procedure is scheduled on: 08/04/22   Report to [facility] Main Entrance    Report to admitting at : 6:00 AM   Call this number if you have problems the morning of surgery [phone]   Do not eat food :After Midnight.   After Midnight you may have the following liquids until : 5:30 AM DAY OF SURGERY  Water Black Coffee (sugar ok, NO MILK/CREAM OR CREAMERS)  Tea (sugar ok, NO MILK/CREAM OR CREAMERS) regular and decaf                             Plain Jell-O (NO RED)                                           Fruit ices (not with fruit pulp, NO RED)                                     Popsicles (NO RED)                                                                  Juice: apple, WHITE grape, WHITE cranberry Sports drinks like Gatorade (NO RED)   The day of surgery:  Drink ONE (1) Pre-Surgery Clear Ensure at : 5:30 AM the morning of surgery. Drink in one sitting. Do not sip.  This drink was given to you during your hospital  pre-op appointment visit. Nothing else to drink after completing the  Pre-Surgery Clear Ensure or G2.          If you have questions, please contact your surgeon's office.  Oral Hygiene is also important to reduce your risk of infection.                                     Remember - BRUSH YOUR TEETH THE MORNING OF SURGERY WITH YOUR REGULAR TOOTHPASTE  DENTURES WILL BE REMOVED PRIOR TO SURGERY PLEASE DO NOT APPLY "Poly grip" OR ADHESIVES!!!   Do NOT smoke after Midnight   Take these medicines the morning of surgery with A SIP OF WATER: N/A. Tylenol as needed.  DO NOT TAKE ANY  ORAL DIABETIC MEDICATIONS DAY OF YOUR SURGERY  Bring CPAP mask and tubing day of surgery.                              You may not have any metal on your body including hair pins, jewelry, and body piercing             Do not wear make-up, lotions, powders, perfumes/cologne, or deodorant  Do not wear nail polish including gel and S&S, artificial/acrylic nails, or any other type of covering on natural nails including finger and toenails. If you have artificial nails, gel coating, etc. that needs to be removed by a nail salon please have this removed prior to surgery or surgery may need to be canceled/ delayed if the surgeon/ anesthesia feels like they are unable to be safely monitored.   Do not shave  48 hours prior to surgery.    Do not bring valuables to the hospital. [facility] IS NOT             RESPONSIBLE   FOR VALUABLES.   Contacts, glasses, or bridgework may not be worn into surgery.   Bring small overnight bag day of surgery.   DO NOT BRING YOUR HOME MEDICATIONS TO THE HOSPITAL. PHARMACY WILL DISPENSE MEDICATIONS LISTED ON YOUR MEDICATION LIST TO YOU DURING YOUR ADMISSION IN THE HOSPITAL!    Patients discharged on the day of surgery will not be allowed to drive home.  Someone NEEDS to stay with you for the first 24 hours after anesthesia.   Special Instructions: Bring a copy of your healthcare power of attorney and living will documents         the day of surgery if you haven't scanned them before.              Please read over the following fact sheets you were given: IF YOU HAVE QUESTIONS ABOUT YOUR PRE-OP INSTRUCTIONS PLEASE CALL [phone]      Pre-operative  5 CHG Bath Instructions   You can play a key role in reducing the risk of infection after surgery. Your skin needs to be as free of germs as possible. You can reduce the number of germs on your skin by washing with CHG (chlorhexidine gluconate) soap before surgery. CHG is an antiseptic soap that kills germs and continues to kill germs even after washing.   DO NOT use if you have an allergy to chlorhexidine/CHG or antibacterial soaps. If your skin becomes reddened or irritated, stop using the CHG and notify one of our RNs at : [phone].   Please shower with the CHG soap starting 4 days before surgery using the following schedule:     Please keep in mind the following:  DO NOT shave, including legs and underarms, starting the day of your first shower.   You may shave your face at any point before/day of surgery.  Place clean sheets on your bed the day you start using CHG soap. Use a clean washcloth (not used since being washed) for each shower. DO NOT sleep with pets once you start using the CHG.   CHG Shower Instructions:  If you choose to wash your hair and private area, wash first with your normal shampoo/soap.  After you use shampoo/soap, rinse your hair and body thoroughly to remove shampoo/soap residue.  Turn the water OFF and apply about 3 tablespoons (45 ml) of CHG soap to a CLEAN  washcloth.  Apply CHG soap ONLY FROM YOUR NECK DOWN TO YOUR TOES (washing for 3-5 minutes)  DO NOT use CHG soap on face, private areas, open wounds, or sores.  Pay special attention to the area where your surgery is being performed.  If you are having back surgery, having someone wash your back for you may be helpful. Wait 2 minutes after CHG soap is applied, then you may rinse off the CHG soap.  Pat dry with a clean towel  Put on clean clothes/pajamas   If you choose to wear lotion, please use ONLY the CHG-compatible lotions on the back of this paper.     Additional instructions for the day of  surgery: DO NOT APPLY any lotions, deodorants, cologne, or perfumes.   Put on clean/comfortable clothes.  Brush your teeth.  Ask your nurse before applying any prescription medications to the skin.      CHG Compatible Lotions   Aveeno Moisturizing lotion  Cetaphil Moisturizing Cream  Cetaphil Moisturizing Lotion  Clairol Herbal Essence Moisturizing Lotion, Dry Skin  Clairol Herbal Essence Moisturizing Lotion, Extra Dry Skin  Clairol Herbal Essence Moisturizing Lotion, Normal Skin  Curel Age Defying Therapeutic Moisturizing Lotion with Alpha Hydroxy  Curel Extreme Care Body Lotion  Curel Soothing Hands Moisturizing Hand Lotion  Curel Therapeutic Moisturizing Cream, Fragrance-Free  Curel Therapeutic Moisturizing Lotion, Fragrance-Free  Curel Therapeutic Moisturizing Lotion, Original Formula  Eucerin Daily Replenishing Lotion  Eucerin Dry Skin Therapy Plus Alpha Hydroxy Crme  Eucerin Dry Skin Therapy Plus Alpha Hydroxy Lotion  Eucerin Original Crme  Eucerin Original Lotion  Eucerin Plus Crme Eucerin Plus Lotion  Eucerin TriLipid Replenishing Lotion  Keri Anti-Bacterial Hand Lotion  Keri Deep Conditioning Original Lotion Dry Skin Formula Softly Scented  Keri Deep Conditioning Original Lotion, Fragrance Free Sensitive Skin Formula  Keri Lotion Fast Absorbing Fragrance Free Sensitive Skin Formula  Keri Lotion Fast Absorbing Softly Scented Dry Skin Formula  Keri Original Lotion  Keri Skin Renewal Lotion Keri Silky Smooth Lotion  Keri Silky Smooth Sensitive Skin Lotion  Nivea Body Creamy Conditioning Oil  Nivea Body Extra Enriched Lotion  Nivea Body Original Lotion  Nivea Body Sheer Moisturizing Lotion Nivea Crme  Nivea Skin Firming Lotion  NutraDerm 30 Skin Lotion  NutraDerm Skin Lotion  NutraDerm Therapeutic Skin Cream  NutraDerm Therapeutic Skin Lotion  ProShield Protective Hand Cream  Provon moisturizing lotion   Incentive Spirometer  An incentive spirometer is a  tool that can help keep your lungs clear and active. This tool measures how well you are filling your lungs with each breath. Taking long deep breaths may help reverse or decrease the chance of developing breathing (pulmonary) problems (especially infection) following: A long period of time when you are unable to move or be active. BEFORE THE PROCEDURE  If the spirometer includes an indicator to show your best effort, your nurse or respiratory therapist will set it to a desired goal. If possible, sit up straight or lean slightly forward. Try not to slouch. Hold the incentive spirometer in an upright position. INSTRUCTIONS FOR USE  Sit on the edge of your bed if possible, or sit up as far as you can in bed or on a chair. Hold the incentive spirometer in an upright position. Breathe out normally. Place the mouthpiece in your mouth and seal your lips tightly around it. Breathe in slowly and as deeply as possible, raising the piston or the ball toward the top of the column. Hold your breath for 3-5 seconds  or for as long as possible. Allow the piston or ball to fall to the bottom of the column. Remove the mouthpiece from your mouth and breathe out normally. Rest for a few seconds and repeat Steps 1 through 7 at least 10 times every 1-2 hours when you are awake. Take your time and take a few normal breaths between deep breaths. The spirometer may include an indicator to show your best effort. Use the indicator as a goal to work toward during each repetition. After each set of 10 deep breaths, practice coughing to be sure your lungs are clear. If you have an incision (the cut made at the time of surgery), support your incision when coughing by placing a pillow or rolled up towels firmly against it. Once you are able to get out of bed, walk around indoors and cough well. You may stop using the incentive spirometer when instructed by your caregiver.  RISKS AND COMPLICATIONS Take your time so you do not  get dizzy or light-headed. If you are in pain, you may need to take or ask for pain medication before doing incentive spirometry. It is harder to take a deep breath if you are having pain. AFTER USE Rest and breathe slowly and easily. It can be helpful to keep track of a log of your progress. Your caregiver can provide you with a simple table to help with this. If you are using the spirometer at home, follow these instructions: SEEK MEDICAL CARE IF:  You are having difficultly using the spirometer. You have trouble using the spirometer as often as instructed. Your pain medication is not giving enough relief while using the spirometer. You develop fever of 100.5 F (38.1 C) or higher. SEEK IMMEDIATE MEDICAL CARE IF:  You cough up bloody sputum that had not been present before. You develop fever of 102 F (38.9 C) or greater. You develop worsening pain at or near the incision site. MAKE SURE YOU:  Understand these instructions. Will watch your condition. Will get help right away if you are not doing well or get worse. Document Released: 05/18/2006 Document Revised: 03/30/2011 Document Reviewed: 07/19/2006 [facility] Patient Information 2014 [place], [place].   ________________________________________________________________________

## 2022-07-22 ENCOUNTER — Other Ambulatory Visit: Payer: Self-pay

## 2022-07-22 ENCOUNTER — Encounter (HOSPITAL_COMMUNITY): Payer: Self-pay

## 2022-07-22 ENCOUNTER — Encounter (HOSPITAL_COMMUNITY)
Admission: RE | Admit: 2022-07-22 | Discharge: 2022-07-22 | Disposition: A | Discharge status: Home / Self Care | Payer: Medicare Other | Source: Ambulatory Visit | Attending: Orthopedic Surgery | Admitting: Orthopedic Surgery

## 2022-07-22 VITALS — BP 137/74 | HR 58 | Temp 98.2°F | Ht 61.0 in | Wt 144.0 lb

## 2022-07-22 DIAGNOSIS — E785 Hyperlipidemia, unspecified: Secondary | ICD-10-CM | POA: Diagnosis not present

## 2022-07-22 DIAGNOSIS — Z01818 Encounter for other preprocedural examination: Secondary | ICD-10-CM | POA: Diagnosis not present

## 2022-07-22 DIAGNOSIS — I10 Essential (primary) hypertension: Secondary | ICD-10-CM | POA: Insufficient documentation

## 2022-07-22 DIAGNOSIS — G4730 Sleep apnea, unspecified: Secondary | ICD-10-CM | POA: Insufficient documentation

## 2022-07-22 DIAGNOSIS — I272 Pulmonary hypertension, unspecified: Secondary | ICD-10-CM | POA: Insufficient documentation

## 2022-07-22 DIAGNOSIS — F419 Anxiety disorder, unspecified: Secondary | ICD-10-CM | POA: Insufficient documentation

## 2022-07-22 DIAGNOSIS — M1712 Unilateral primary osteoarthritis, left knee: Secondary | ICD-10-CM | POA: Insufficient documentation

## 2022-07-22 DIAGNOSIS — F32A Depression, unspecified: Secondary | ICD-10-CM | POA: Insufficient documentation

## 2022-07-22 DIAGNOSIS — G4700 Insomnia, unspecified: Secondary | ICD-10-CM | POA: Diagnosis not present

## 2022-07-22 DIAGNOSIS — I2510 Atherosclerotic heart disease of native coronary artery without angina pectoris: Secondary | ICD-10-CM | POA: Insufficient documentation

## 2022-07-22 HISTORY — DX: Unspecified osteoarthritis, unspecified site: M19.90

## 2022-07-22 LAB — CBC
HCT: 37.3 % (ref 36.0–46.0)
Hemoglobin: 11.6 g/dL — ABNORMAL LOW (ref 12.0–15.0)
MCH: 29.2 pg (ref 26.0–34.0)
MCHC: 31.1 g/dL (ref 30.0–36.0)
MCV: 94 fL (ref 80.0–100.0)
Platelets: 215 10*3/uL (ref 150–400)
RBC: 3.97 MIL/uL (ref 3.87–5.11)
RDW: 14.6 % (ref 11.5–15.5)
WBC: 6.3 10*3/uL (ref 4.0–10.5)
nRBC: 0 % (ref 0.0–0.2)

## 2022-07-22 LAB — BASIC METABOLIC PANEL
Anion gap: 6 (ref 5–15)
BUN: 18 mg/dL (ref 8–23)
CO2: 24 mmol/L (ref 22–32)
Calcium: 9.1 mg/dL (ref 8.9–10.3)
Chloride: 108 mmol/L (ref 98–111)
Creatinine, Ser: 0.68 mg/dL (ref 0.44–1.00)
GFR, Estimated: >60 mL/min (ref >60)
Glucose, Bld: 97 mg/dL (ref 70–99)
Potassium: 4.6 mmol/L (ref 3.5–5.1)
Sodium: 138 mmol/L (ref 135–145)

## 2022-07-22 LAB — SURGICAL PCR SCREEN
MRSA, PCR: NEGATIVE
Staphylococcus aureus: NEGATIVE

## 2022-07-22 NOTE — Progress Notes (Signed)
For Short Stay: COVID SWAB appointment date:  Bowel Prep reminder:   For Anesthesia: PCP - Dr. Roxy Manns Cardiologist - Dr. Armanda Magic. LOV: 05/25/22.  Clearance: Pending.  Chest x-ray - CT chest: 10/11/123 EKG - 05/25/22 Stress Test -  ECHO - 07/06/22 Cardiac Cath -  Pacemaker/ICD device last checked: Pacemaker orders received: Device Rep notified:  Spinal Cord Stimulator: N/A  Sleep Study - Yes CPAP - NO  Fasting Blood Sugar - N/A Checks Blood Sugar _____ times a day Date and result of last Hgb A1c-  Last dose of GLP1 agonist- N/A GLP1 instructions:   Last dose of SGLT-2 inhibitors- N/A SGLT-2 instructions:   Blood Thinner Instructions:  Aspirin Instructions: No instructions. Last Dose:  Activity level: Can go up a flight of stairs and activities of daily living without stopping and without chest pain and/or shortness of breath   Able to exercise without chest pain and/or shortness of breath  Anesthesia review: Hx: HTN,CAD,OSA(NO CPAP)  Patient denies shortness of breath, fever, cough and chest pain at PAT appointment   Patient verbalized understanding of instructions that were given to them at the PAT appointment. Patient was also instructed that they will need to review over the PAT instructions again at home before surgery.

## 2022-07-28 NOTE — Progress Notes (Signed)
Anesthesia Chart Review   Case: [id number] Date/Time: 08/04/22 0815   Procedure: TOTAL KNEE ARTHROPLASTY (Left: Knee)   Anesthesia type: Spinal   Pre-op diagnosis: OA LEFT KNEE   Location: [facility] ROOM 07 / [facility]   Surgeons: Sheral Apley, MD       DISCUSSION:67 y.o. never smoker with h/o HTN, sleep apnea, nonobstructive CAD, RBBB, left knee OA scheduled for above procedure 08/04/2022 with Dr. Margarita Rana.   Pt last seen by cardiology 05/25/2022. Per OV note, "Low perioperative risk of a major cardiac event is 0.9% according to the Revised Cardiac Risk Index (RCRI).  Therefore, the patient is at low risk for perioperative complications.   The patient's  functional capacity is normal at 6.36 METs according to the Duke Activity Status Index (DASI). Recommendations: According to ACC/AHA guidelines, no further cardiovascular testing needed.  The patient may proceed to surgery at acceptable risk."  Anticipate pt can proceed with planned procedure barring acute status change.   VS: BP 137/74   Pulse (!) 58   Temp 36.8 C (Oral)   Ht 5\' 1"  (1.549 m)   Wt 65.3 kg   SpO2 99%   BMI 27.21 kg/m   PROVIDERS: Tower, Audrie Gallus, MD is PCP   Cardiology:  Armanda Magic, MD  LABS: Labs reviewed: Acceptable for surgery. (all labs ordered are listed, but only abnormal results are displayed)  Labs Reviewed  CBC - Abnormal; Notable for the following components:      Result Value   Hemoglobin 11.6 (*)    All other components within normal limits  SURGICAL PCR SCREEN  BASIC METABOLIC PANEL     IMAGES:   EKG:   CV: Echo 07/06/2022 1. Left ventricular ejection fraction, by estimation, is 60 to 65%. The  left ventricle has normal function. Left ventricular diastolic parameters  were normal.   2. Right ventricular systolic function is normal. The right ventricular  size is normal. There is normal pulmonary artery systolic pressure. The  estimated right ventricular systolic pressure is 34.6  mmHg.   3. The mitral valve is grossly normal. Trivial mitral valve  regurgitation.   4. Tricuspid valve regurgitation is moderate.   5. The aortic valve is tricuspid. Aortic valve sclerosis is present, with  no evidence of aortic valve stenosis.   6. The inferior vena cava is dilated in size with >50% respiratory  variability, suggesting right atrial pressure of 8 mmHg.  Past Medical History:  Diagnosis Date   Arthritis    CAD (coronary artery disease), native coronary artery    coronary CTA with 25-49% prox LAD and coronary Ca score of 242 in 09/2018   Depression with anxiety 10/30/2011   Distal radius fracture, left 05/23/2011   Essential hypertension 06/14/2018   History of radius fracture 05/23/2011   Of note- nl dexa 2013-not a fragility fracture    Hyperlipidemia 09/02/2016   Insomnia    Pulmonary hypertension (HCC)    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 10/2018   SLEEP APNEA 01/04/2007   Qualifier: Diagnosis of  By: Jillyn Hidden FNP, Mcarthur Rossetti    Spinal stenosis, lumbar    L4 and L5 - severe; L3 and L4 - moderate   Stress reaction 07/18/2018   Tricuspid regurgitation    moderate by echo 06/2022   Wrist fracture 11/2019   right     Past Surgical History:  Procedure Laterality Date   CARPAL TUNNEL RELEASE Right 12/2018   COLONOSCOPY  08/2009  Jarold Motto  tic's   WRIST FRACTURE SURGERY  2013    MEDICATIONS:  metroNIDAZOLE (METROGEL) 0.75 % gel   acetaminophen (TYLENOL) 500 MG tablet   aspirin EC 81 MG tablet   atorvastatin (LIPITOR) 40 MG tablet   benzonatate (TESSALON) 200 MG capsule   betamethasone dipropionate 0.05 % lotion   Ca Carbonate-Mag Hydroxide (ROLAIDS EXTRA STRENGTH PO)   cetirizine (ZYRTEC) 10 MG tablet   Cholecalciferol (VITAMIN D3) 50 MCG (2000 UT) capsule   fluticasone (FLONASE) 50 MCG/ACT nasal spray   losartan (COZAAR) 100 MG tablet   naproxen sodium (ALEVE) 220 MG tablet   PRESCRIPTION MEDICATION   Propylene Glycol (SYSTANE  COMPLETE) 0.6 % SOLN   traZODone (DESYREL) 50 MG tablet   TURMERIC PO   valACYclovir (VALTREX) 1000 MG tablet   No current facility-administered medications for this encounter.    Jodell Cipro Ward, PA-C [facility] Pre-Surgical Testing [phone]

## 2022-07-29 ENCOUNTER — Other Ambulatory Visit (HOSPITAL_COMMUNITY): Payer: Medicare Other

## 2022-08-03 NOTE — Anesthesia Preprocedure Evaluation (Signed)
Anesthesia Evaluation  Patient identified by MRN, date of birth, ID band Patient awake    Reviewed: Allergy & Precautions, NPO status , Patient's Chart, lab work & pertinent test results  Airway Mallampati: III  TM Distance: >3 FB Neck ROM: Full    Dental  (+) Teeth Intact, Dental Advisory Given   Pulmonary sleep apnea (snores at night, didn't go through with sleep study)    Pulmonary exam normal breath sounds clear to auscultation       Cardiovascular hypertension (165/64 preop, per pt usually lower than this), Pt. on medications pulmonary hypertension (mild pHTN on echo)+ CAD  Normal cardiovascular exam+ Valvular Problems/Murmurs (mod TR)  Rhythm:Regular Rate:Normal  Echo 06/2022  1. Left ventricular ejection fraction, by estimation, is 60 to 65%. The  left ventricle has normal function. Left ventricular diastolic parameters  were normal.   2. Right ventricular systolic function is normal. The right ventricular  size is normal. There is normal pulmonary artery systolic pressure. The  estimated right ventricular systolic pressure is 34.6 mmHg.   3. The mitral valve is grossly normal. Trivial mitral valve  regurgitation.   4. Tricuspid valve regurgitation is moderate.   5. The aortic valve is tricuspid. Aortic valve sclerosis is present, with  no evidence of aortic valve stenosis.   6. The inferior vena cava is dilated in size with >50% respiratory  variability, suggesting right atrial pressure of 8 mmHg.   1. Left ventricular ejection fraction, by estimation, is 60 to 65%. The  left ventricle has normal function. Left ventricular diastolic parameters  were normal.   2. Right ventricular systolic function is normal. The right ventricular  size is normal. There is normal pulmonary artery systolic pressure. The  estimated right ventricular systolic pressure is 34.6 mmHg.   3. The mitral valve is grossly normal. Trivial mitral  valve  regurgitation.   4. Tricuspid valve regurgitation is moderate.   5. The aortic valve is tricuspid. Aortic valve sclerosis is present, with  no evidence of aortic valve stenosis.   6. The inferior vena cava is dilated in size with >50% respiratory  variability, suggesting right atrial pressure of 8 mmHg.     Neuro/Psych  PSYCHIATRIC DISORDERS Anxiety Depression    negative neurological ROS     GI/Hepatic negative GI ROS, Neg liver ROS,,,  Endo/Other  negative endocrine ROS    Renal/GU negative Renal ROS  negative genitourinary   Musculoskeletal  (+) Arthritis , Osteoarthritis,  Hx spinal stenosis- no surgeries    Abdominal   Peds  Hematology  (+) Blood dyscrasia, anemia Hb 11.6, plt 215   Anesthesia Other Findings   Reproductive/Obstetrics negative OB ROS                             Anesthesia Physical Anesthesia Plan  ASA: 3  Anesthesia Plan: Spinal, Regional and MAC   Post-op Pain Management: Regional block* and Tylenol PO (pre-op)*   Induction:   PONV Risk Score and Plan: 2 and Propofol infusion and TIVA  Airway Management Planned: Natural Airway and Nasal Cannula  Additional Equipment: None  Intra-op Plan:   Post-operative Plan:   Informed Consent: I have reviewed the patients History and Physical, chart, labs and discussed the procedure including the risks, benefits and alternatives for the proposed anesthesia with the patient or authorized representative who has indicated his/her understanding and acceptance.     Dental advisory given  Plan Discussed with: CRNA  Anesthesia Plan Comments:         Anesthesia Quick Evaluation

## 2022-08-03 NOTE — Discharge Instructions (Signed)
You may bear weight as tolerated. Keep your dressing on and dry until follow up. Take medicine to prevent blood clots as directed. Take pain medicine as needed with the goal of transitioning to over the counter medicines.    INSTRUCTIONS AFTER JOINT REPLACEMENT   Remove items at home which could result in a fall. This includes throw rugs or furniture in walking pathways ICE to the affected joint every three hours while awake for 30 minutes at a time, for at least the first 3-5 days, and then as needed for pain and swelling.  Continue to use ice for pain and swelling. You may notice swelling that will progress down to the foot and ankle.  This is normal after surgery.  Elevate your leg when you are not up walking on it.   Continue to use the breathing machine you got in the hospital (incentive spirometer) which will help keep your temperature down.  It is common for your temperature to cycle up and down following surgery, especially at night when you are not up moving around and exerting yourself.  The breathing machine keeps your lungs expanded and your temperature down.   DIET:  As you were doing prior to hospitalization, we recommend a well-balanced diet.  DRESSING / WOUND CARE / SHOWERING  You may shower 3 days after surgery, but keep the wounds dry during showering.  You may use an occlusive plastic wrap (Press'n Seal for example) with blue painter's tape at edges, NO SOAKING/SUBMERGING IN THE BATHTUB.  If the bandage gets wet, call the office.   ACTIVITY  Increase activity slowly as tolerated, but follow the weight bearing instructions below.   No driving for 6 weeks or until further direction given by your physician.  You cannot drive while taking narcotics.  No lifting or carrying greater than 10 lbs. until further directed by your surgeon. Avoid periods of inactivity such as sitting longer than an hour when not asleep. This helps prevent blood clots.  You may return to work once you  are authorized by your doctor.    WEIGHT BEARING   Weight bearing as tolerated with assist device (walker, cane, etc) as directed, use it as long as suggested by your surgeon or therapist, typically at least 4-6 weeks.   EXERCISES  Results after joint replacement surgery are often greatly improved when you follow the exercise, range of motion and muscle strengthening exercises prescribed by your doctor. Safety measures are also important to protect the joint from further injury. Any time any of these exercises cause you to have increased pain or swelling, decrease what you are doing until you are comfortable again and then slowly increase them. If you have problems or questions, call your caregiver or physical therapist for advice.   Rehabilitation is important following a joint replacement. After just a few days of immobilization, the muscles of the leg can become weakened and shrink (atrophy).  These exercises are designed to build up the tone and strength of the thigh and leg muscles and to improve motion. Often times heat used for twenty to thirty minutes before working out will loosen up your tissues and help with improving the range of motion but do not use heat for the first two weeks following surgery (sometimes heat can increase post-operative swelling).   These exercises can be done on a training (exercise) mat, on the floor, on a table or on a bed. Use whatever works the best and is most comfortable for you.  Use music or television while you are exercising so that the exercises are a pleasant break in your day. This will make your life better with the exercises acting as a break in your routine that you can look forward to.   Perform all exercises about fifteen times, three times per day or as directed.  You should exercise both the operative leg and the other leg as well.  Exercises include:   Quad Sets - Tighten up the muscle on the front of the thigh (Quad) and hold for 5-10  seconds.   Straight Leg Raises - With your knee straight (if you were given a brace, keep it on), lift the leg to 60 degrees, hold for 3 seconds, and slowly lower the leg.  Perform this exercise against resistance later as your leg gets stronger.  Leg Slides: Lying on your back, slowly slide your foot toward your buttocks, bending your knee up off the floor (only go as far as is comfortable). Then slowly slide your foot back down until your leg is flat on the floor again.  Angel Wings: Lying on your back spread your legs to the side as far apart as you can without causing discomfort.  Hamstring Strength:  Lying on your back, push your heel against the floor with your leg straight by tightening up the muscles of your buttocks.  Repeat, but this time bend your knee to a comfortable angle, and push your heel against the floor.  You may put a pillow under the heel to make it more comfortable if necessary.   A rehabilitation program following joint replacement surgery can speed recovery and prevent re-injury in the future due to weakened muscles. Contact your doctor or a physical therapist for more information on knee rehabilitation.    CONSTIPATION  Constipation is defined medically as fewer than three stools per week and severe constipation as less than one stool per week.  Even if you have a regular bowel pattern at home, your normal regimen is likely to be disrupted due to multiple reasons following surgery.  Combination of anesthesia, postoperative narcotics, change in appetite and fluid intake all can affect your bowels.   YOU MUST use at least one of the following options; they are listed in order of increasing strength to get the job done.  They are all available over the counter, and you may need to use some, POSSIBLY even all of these options:    Drink plenty of fluids (prune juice may be helpful) and high fiber foods Colace 100 mg by mouth twice a day  Senokot for constipation as directed and  as needed Dulcolax (bisacodyl), take with full glass of water  Miralax (polyethylene glycol) once or twice a day as needed.  If you have tried all these things and are unable to have a bowel movement in the first 3-4 days after surgery call either your surgeon or your primary doctor.    If you experience loose stools or diarrhea, hold the medications until you stool forms back up.  If your symptoms do not get better within 1 week or if they get worse, check with your doctor.  If you experience "the worst abdominal pain ever" or develop nausea or vomiting, please contact the office immediately for further recommendations for treatment.   ITCHING:  If you experience itching with your medications, try taking only a single pain pill, or even half a pain pill at a time.  You can also use Benadryl over the counter  for itching or also to help with sleep.   TED HOSE STOCKINGS:  Use stockings on both legs until for at least 2 weeks or as directed by physician office. They may be removed at night for sleeping.  MEDICATIONS:  See your medication summary on the "After Visit Summary" that nursing will review with you.  You may have some home medications which will be placed on hold until you complete the course of blood thinner medication.  It is important for you to complete the blood thinner medication as prescribed.  Take medicines as prescribed.   You have several different medicines that work in different ways. - Tylenol is for mild to moderate pain. Try to take this medicine before turning to your narcotic medicines.  - Meloxicam is to reduce pain / inflammation - Robaxin is for muscle spasms. This medicine can make you drowsy. - Oxycodone is a narcotic pain medicine.  Take this for severe pain. This medicine can be dehydrating / constipating. - Zofran is for nausea and vomiting. - Senokot is for constipation prevention - Aspirin is to prevent blood clots after surgery.   PRECAUTIONS:  If you  experience chest pain or shortness of breath - call 911 immediately for transfer to the hospital emergency department.   If you develop a fever greater that 101 F, purulent drainage from wound, increased redness or drainage from wound, foul odor from the wound/dressing, or calf pain - CONTACT YOUR SURGEON.                                                   FOLLOW-UP APPOINTMENTS:  If you do not already have a post-op appointment, please call the office [phone] for an appointment to be seen by Dr. Eulah Pont in 2 weeks.   OTHER INSTRUCTIONS:   MAKE SURE YOU:  Understand these instructions.  Get help right away if you are not doing well or get worse.    Thank you for letting us be a part of your medical care team.  It is a privilege we respect greatly.  We hope these instructions will help you stay on track for a fast and full recovery!

## 2022-08-04 ENCOUNTER — Encounter (HOSPITAL_COMMUNITY)
Admission: RE | Disposition: A | Discharge status: Home / Self Care | Payer: Self-pay | Source: Ambulatory Visit | Attending: Orthopedic Surgery

## 2022-08-04 ENCOUNTER — Ambulatory Visit (HOSPITAL_COMMUNITY): Payer: Medicare Other

## 2022-08-04 ENCOUNTER — Ambulatory Visit (HOSPITAL_COMMUNITY): Payer: Medicare Other | Admitting: Medical

## 2022-08-04 ENCOUNTER — Ambulatory Visit (HOSPITAL_BASED_OUTPATIENT_CLINIC_OR_DEPARTMENT_OTHER): Payer: Medicare Other | Admitting: Anesthesiology

## 2022-08-04 ENCOUNTER — Other Ambulatory Visit: Payer: Self-pay

## 2022-08-04 ENCOUNTER — Ambulatory Visit (HOSPITAL_COMMUNITY)
Admission: RE | Admit: 2022-08-04 | Discharge: 2022-08-04 | Disposition: A | Discharge status: Home / Self Care | Payer: Medicare Other | Source: Ambulatory Visit | Attending: Orthopedic Surgery | Admitting: Orthopedic Surgery

## 2022-08-04 ENCOUNTER — Encounter (HOSPITAL_COMMUNITY): Payer: Self-pay | Admitting: Orthopedic Surgery

## 2022-08-04 DIAGNOSIS — I2510 Atherosclerotic heart disease of native coronary artery without angina pectoris: Secondary | ICD-10-CM | POA: Insufficient documentation

## 2022-08-04 DIAGNOSIS — F419 Anxiety disorder, unspecified: Secondary | ICD-10-CM | POA: Diagnosis not present

## 2022-08-04 DIAGNOSIS — Z79899 Other long term (current) drug therapy: Secondary | ICD-10-CM | POA: Insufficient documentation

## 2022-08-04 DIAGNOSIS — M1712 Unilateral primary osteoarthritis, left knee: Secondary | ICD-10-CM | POA: Insufficient documentation

## 2022-08-04 DIAGNOSIS — G4730 Sleep apnea, unspecified: Secondary | ICD-10-CM | POA: Diagnosis not present

## 2022-08-04 DIAGNOSIS — E785 Hyperlipidemia, unspecified: Secondary | ICD-10-CM | POA: Diagnosis not present

## 2022-08-04 DIAGNOSIS — I10 Essential (primary) hypertension: Secondary | ICD-10-CM | POA: Insufficient documentation

## 2022-08-04 DIAGNOSIS — I272 Pulmonary hypertension, unspecified: Secondary | ICD-10-CM | POA: Insufficient documentation

## 2022-08-04 DIAGNOSIS — F32A Depression, unspecified: Secondary | ICD-10-CM | POA: Insufficient documentation

## 2022-08-04 DIAGNOSIS — G8918 Other acute postprocedural pain: Secondary | ICD-10-CM | POA: Diagnosis not present

## 2022-08-04 DIAGNOSIS — Z96652 Presence of left artificial knee joint: Secondary | ICD-10-CM | POA: Diagnosis not present

## 2022-08-04 HISTORY — PX: TOTAL KNEE ARTHROPLASTY: SHX125

## 2022-08-04 SURGERY — ARTHROPLASTY, KNEE, TOTAL
Anesthesia: Monitor Anesthesia Care | Site: Knee | Laterality: Left

## 2022-08-04 MED ORDER — PROPOFOL 10 MG/ML IV BOLUS
INTRAVENOUS | Status: AC
  Filled 2022-08-04: qty 20

## 2022-08-04 MED ORDER — SODIUM CHLORIDE 0.9 % IR SOLN
Status: DC | PRN
  Administered 2022-08-04: 1000 mL

## 2022-08-04 MED ORDER — CHLORHEXIDINE GLUCONATE 0.12 % MT SOLN
15.0000 mL | Freq: Once | OROMUCOSAL | Status: AC
  Administered 2022-08-04: 15 mL via OROMUCOSAL

## 2022-08-04 MED ORDER — PROPOFOL 500 MG/50ML IV EMUL
INTRAVENOUS | Status: DC | PRN
  Administered 2022-08-04: 25 ug/kg/min via INTRAVENOUS

## 2022-08-04 MED ORDER — FENTANYL CITRATE PF 50 MCG/ML IJ SOSY
100.0000 ug | PREFILLED_SYRINGE | Freq: Once | INTRAMUSCULAR | Status: AC
  Administered 2022-08-04: 50 ug via INTRAVENOUS
  Filled 2022-08-04: qty 2

## 2022-08-04 MED ORDER — MELOXICAM 15 MG PO TABS: 15.0000 mg | ORAL_TABLET | Freq: Every day | ORAL | 0 refills | Status: DC

## 2022-08-04 MED ORDER — SENNA-DOCUSATE SODIUM 8.6-50 MG PO TABS: 2.0000 | ORAL_TABLET | Freq: Every day | ORAL | 1 refills | Status: DC

## 2022-08-04 MED ORDER — LACTATED RINGERS IV BOLUS
500.0000 mL | Freq: Once | INTRAVENOUS | Status: AC
  Administered 2022-08-04: 500 mL via INTRAVENOUS

## 2022-08-04 MED ORDER — ROPIVACAINE HCL 5 MG/ML IJ SOLN
INTRAMUSCULAR | Status: DC | PRN
  Administered 2022-08-04: 30 mL via PERINEURAL

## 2022-08-04 MED ORDER — MIDAZOLAM HCL 2 MG/2ML IJ SOLN
INTRAMUSCULAR | Status: DC | PRN
  Administered 2022-08-04: 1 mg via INTRAVENOUS

## 2022-08-04 MED ORDER — SODIUM CHLORIDE (PF) 0.9 % IJ SOLN
INTRAMUSCULAR | Status: AC
  Filled 2022-08-04: qty 50

## 2022-08-04 MED ORDER — POVIDONE-IODINE 10 % EX SWAB: 2.0000 | Freq: Once | CUTANEOUS | Status: DC

## 2022-08-04 MED ORDER — HYDROMORPHONE HCL 1 MG/ML IJ SOLN: 0.2500 mg | INTRAMUSCULAR | Status: DC | PRN

## 2022-08-04 MED ORDER — METHOCARBAMOL 500 MG IVPB - SIMPLE MED
500.0000 mg | Freq: Four times a day (QID) | INTRAVENOUS | Status: DC | PRN
  Administered 2022-08-04: 500 mg via INTRAVENOUS

## 2022-08-04 MED ORDER — DEXAMETHASONE SODIUM PHOSPHATE 10 MG/ML IJ SOLN
INTRAMUSCULAR | Status: DC | PRN
  Administered 2022-08-04 (×2): 10 mg

## 2022-08-04 MED ORDER — BUPIVACAINE LIPOSOME 1.3 % IJ SUSP
INTRAMUSCULAR | Status: AC
  Filled 2022-08-04: qty 20

## 2022-08-04 MED ORDER — ORAL CARE MOUTH RINSE: 15.0000 mL | Freq: Once | OROMUCOSAL | Status: AC

## 2022-08-04 MED ORDER — ONDANSETRON HCL 4 MG/2ML IJ SOLN: 4.0000 mg | Freq: Once | INTRAMUSCULAR | Status: DC | PRN

## 2022-08-04 MED ORDER — FENTANYL CITRATE (PF) 100 MCG/2ML IJ SOLN
INTRAMUSCULAR | Status: AC
  Filled 2022-08-04: qty 2

## 2022-08-04 MED ORDER — ONDANSETRON HCL 4 MG/2ML IJ SOLN
INTRAMUSCULAR | Status: DC | PRN
Start: 2022-08-04 — End: 2022-08-04
  Administered 2022-08-04: 4 mg via INTRAVENOUS

## 2022-08-04 MED ORDER — METHOCARBAMOL 500 MG PO TABS: 500.0000 mg | ORAL_TABLET | Freq: Four times a day (QID) | ORAL | Status: DC | PRN

## 2022-08-04 MED ORDER — 0.9 % SODIUM CHLORIDE (POUR BTL) OPTIME
TOPICAL | Status: DC | PRN
  Administered 2022-08-04: 1000 mL

## 2022-08-04 MED ORDER — BUPIVACAINE LIPOSOME 1.3 % IJ SUSP: 20.0000 mL | Freq: Once | INTRAMUSCULAR | Status: DC

## 2022-08-04 MED ORDER — ACETAMINOPHEN 500 MG PO TABS: 1000.0000 mg | ORAL_TABLET | Freq: Three times a day (TID) | ORAL | 0 refills | Status: AC

## 2022-08-04 MED ORDER — WATER FOR IRRIGATION, STERILE IR SOLN
Status: DC | PRN
  Administered 2022-08-04: 2000 mL

## 2022-08-04 MED ORDER — METHOCARBAMOL 500 MG IVPB - SIMPLE MED
INTRAVENOUS | Status: AC
  Filled 2022-08-04: qty 55

## 2022-08-04 MED ORDER — PHENYLEPHRINE HCL-NACL 20-0.9 MG/250ML-% IV SOLN
INTRAVENOUS | Status: AC
  Filled 2022-08-04: qty 500

## 2022-08-04 MED ORDER — OXYCODONE HCL 5 MG PO TABS: ORAL_TABLET | ORAL | 0 refills | Status: AC

## 2022-08-04 MED ORDER — ASPIRIN 81 MG PO CHEW: 81.0000 mg | CHEWABLE_TABLET | Freq: Two times a day (BID) | ORAL | 0 refills | Status: AC

## 2022-08-04 MED ORDER — CEFAZOLIN SODIUM-DEXTROSE 2-4 GM/100ML-% IV SOLN
2.0000 g | INTRAVENOUS | Status: AC
  Administered 2022-08-04: 2 g via INTRAVENOUS
  Filled 2022-08-04: qty 100

## 2022-08-04 MED ORDER — MIDAZOLAM HCL 2 MG/2ML IJ SOLN
INTRAMUSCULAR | Status: AC
  Filled 2022-08-04: qty 2

## 2022-08-04 MED ORDER — METHOCARBAMOL 500 MG PO TABS: 500.0000 mg | ORAL_TABLET | Freq: Three times a day (TID) | ORAL | 0 refills | Status: DC | PRN

## 2022-08-04 MED ORDER — ONDANSETRON HCL 4 MG/2ML IJ SOLN
INTRAMUSCULAR | Status: AC
  Filled 2022-08-04: qty 2

## 2022-08-04 MED ORDER — SODIUM CHLORIDE 0.9% FLUSH
INTRAVENOUS | Status: DC | PRN
  Administered 2022-08-04: 30 mL

## 2022-08-04 MED ORDER — PROPOFOL 1000 MG/100ML IV EMUL
INTRAVENOUS | Status: AC
  Filled 2022-08-04: qty 100

## 2022-08-04 MED ORDER — AMISULPRIDE (ANTIEMETIC) 5 MG/2ML IV SOLN: 10.0000 mg | Freq: Once | INTRAVENOUS | Status: DC | PRN

## 2022-08-04 MED ORDER — DEXAMETHASONE SODIUM PHOSPHATE 10 MG/ML IJ SOLN: 8.0000 mg | Freq: Once | INTRAMUSCULAR | Status: DC

## 2022-08-04 MED ORDER — OXYCODONE HCL 5 MG/5ML PO SOLN: 5.0000 mg | Freq: Once | ORAL | Status: DC | PRN

## 2022-08-04 MED ORDER — LACTATED RINGERS IV SOLN: INTRAVENOUS | Status: DC

## 2022-08-04 MED ORDER — TRANEXAMIC ACID-NACL 1000-0.7 MG/100ML-% IV SOLN
1000.0000 mg | INTRAVENOUS | Status: AC
  Administered 2022-08-04: 1000 mg via INTRAVENOUS
  Filled 2022-08-04: qty 100

## 2022-08-04 MED ORDER — OXYCODONE HCL 5 MG PO TABS: 5.0000 mg | ORAL_TABLET | Freq: Once | ORAL | Status: DC | PRN

## 2022-08-04 MED ORDER — BUPIVACAINE LIPOSOME 1.3 % IJ SUSP
INTRAMUSCULAR | Status: DC | PRN
  Administered 2022-08-04: 20 mL

## 2022-08-04 MED ORDER — KETOROLAC TROMETHAMINE 30 MG/ML IJ SOLN: 15.0000 mg | Freq: Once | INTRAMUSCULAR | Status: DC | PRN

## 2022-08-04 MED ORDER — ACETAMINOPHEN 500 MG PO TABS
1000.0000 mg | ORAL_TABLET | Freq: Once | ORAL | Status: AC
  Administered 2022-08-04: 1000 mg via ORAL
  Filled 2022-08-04: qty 2

## 2022-08-04 MED ORDER — BUPIVACAINE-EPINEPHRINE 0.25% -1:200000 IJ SOLN
INTRAMUSCULAR | Status: DC | PRN
  Administered 2022-08-04: 30 mL

## 2022-08-04 MED ORDER — ONDANSETRON HCL 4 MG PO TABS: 4.0000 mg | ORAL_TABLET | Freq: Three times a day (TID) | ORAL | 0 refills | Status: AC | PRN

## 2022-08-04 MED ORDER — PHENYLEPHRINE HCL (PRESSORS) 10 MG/ML IV SOLN
INTRAVENOUS | Status: AC
  Filled 2022-08-04: qty 1

## 2022-08-04 MED ORDER — BUPIVACAINE-EPINEPHRINE 0.25% -1:200000 IJ SOLN
INTRAMUSCULAR | Status: AC
  Filled 2022-08-04: qty 1

## 2022-08-04 MED ORDER — MIDAZOLAM HCL 2 MG/2ML IJ SOLN
2.0000 mg | Freq: Once | INTRAMUSCULAR | Status: AC
  Administered 2022-08-04: 1 mg via INTRAVENOUS
  Filled 2022-08-04: qty 2

## 2022-08-04 SURGICAL SUPPLY — 56 items
BAG COUNTER SPONGE SURGICOUNT (BAG) IMPLANT
BAG SPNG CNTER NS LX DISP (BAG)
BLADE SAG 18X100X1.27 (BLADE) ×2 IMPLANT
BLADE SAGITTAL 25.0X1.37X90 (BLADE) ×2 IMPLANT
BLADE SURG 15 STRL LF DISP TIS (BLADE) ×2 IMPLANT
BLADE SURG 15 STRL SS (BLADE) ×1
BNDG CMPR MED 10X6 ELC LF (GAUZE/BANDAGES/DRESSINGS) ×1
BNDG CMPR MED 15X6 ELC VLCR LF (GAUZE/BANDAGES/DRESSINGS) ×1
BNDG ELASTIC 6X10 VLCR STRL LF (GAUZE/BANDAGES/DRESSINGS) ×2 IMPLANT
BNDG ELASTIC 6X15 VLCR STRL LF (GAUZE/BANDAGES/DRESSINGS) IMPLANT
BOWL SMART MIX CTS (DISPOSABLE) IMPLANT
BSPLAT TIB 4 KN TRITANIUM (Knees) ×1 IMPLANT
CLSR STERI-STRIP ANTIMIC 1/2X4 (GAUZE/BANDAGES/DRESSINGS) ×4 IMPLANT
COVER SURGICAL LIGHT HANDLE (MISCELLANEOUS) ×2 IMPLANT
CUFF TOURN SGL QUICK 34 (TOURNIQUET CUFF) ×1
CUFF TRNQT CYL 34X4.125X (TOURNIQUET CUFF) ×2 IMPLANT
DRAPE U-SHAPE 47X51 STRL (DRAPES) ×2 IMPLANT
DRSG MEPILEX POST OP 4X12 (GAUZE/BANDAGES/DRESSINGS) ×2 IMPLANT
DRSG MEPILEX POST OP 4X8 (GAUZE/BANDAGES/DRESSINGS) IMPLANT
DURAPREP 26ML APPLICATOR (WOUND CARE) ×4 IMPLANT
ELECT REM PT RETURN 15FT ADLT (MISCELLANEOUS) ×2 IMPLANT
FEMORAL RETAINING CRUC KNEE #4 (Knees) IMPLANT
GLOVE BIO SURGEON STRL SZ7.5 (GLOVE) ×4 IMPLANT
GLOVE BIOGEL PI IND STRL 7.5 (GLOVE) ×2 IMPLANT
GLOVE BIOGEL PI IND STRL 8 (GLOVE) ×2 IMPLANT
GLOVE SURG SYN 7.5 E (GLOVE) ×1
GLOVE SURG SYN 7.5 PF PI (GLOVE) ×2 IMPLANT
GOWN STRL REUS W/ TWL LRG LVL3 (GOWN DISPOSABLE) ×2 IMPLANT
GOWN STRL REUS W/ TWL XL LVL3 (GOWN DISPOSABLE) ×2 IMPLANT
GOWN STRL REUS W/TWL LRG LVL3 (GOWN DISPOSABLE) ×1
GOWN STRL REUS W/TWL XL LVL3 (GOWN DISPOSABLE) ×1
HANDPIECE INTERPULSE COAX TIP (DISPOSABLE) ×1
HOLDER FOLEY CATH W/STRAP (MISCELLANEOUS) IMPLANT
IMMOBILIZER KNEE 20 (SOFTGOODS) ×1
IMMOBILIZER KNEE 20 THIGH 36 (SOFTGOODS) IMPLANT
IMMOBILIZER KNEE 22 UNIV (SOFTGOODS) IMPLANT
INSERT TRIATH CS SZ4 10 (Insert) IMPLANT
KIT TURNOVER KIT A (KITS) IMPLANT
KNEE PATELLA ASYMMETRIC 10X32 (Knees) IMPLANT
KNEE TIBIAL COMP TRI SZ4 (Knees) IMPLANT
MANIFOLD NEPTUNE II (INSTRUMENTS) ×2 IMPLANT
NS IRRIG 1000ML POUR BTL (IV SOLUTION) ×2 IMPLANT
PACK TOTAL KNEE CUSTOM (KITS) ×2 IMPLANT
PIN FLUTED HEDLESS FIX 3.5X1/8 (PIN) IMPLANT
PROTECTOR NERVE ULNAR (MISCELLANEOUS) ×2 IMPLANT
SET HNDPC FAN SPRY TIP SCT (DISPOSABLE) ×2 IMPLANT
SPIKE FLUID TRANSFER (MISCELLANEOUS) ×2 IMPLANT
SUT MNCRL AB 3-0 PS2 18 (SUTURE) ×2 IMPLANT
SUT VIC AB 0 CT1 36 (SUTURE) ×2 IMPLANT
SUT VIC AB 1 CT1 36 (SUTURE) ×2 IMPLANT
SUT VIC AB 2-0 CT1 27 (SUTURE) ×1
SUT VIC AB 2-0 CT1 TAPERPNT 27 (SUTURE) ×2 IMPLANT
TOWEL GREEN STERILE FF (TOWEL DISPOSABLE) ×2 IMPLANT
TRAY FOLEY MTR SLVR 16FR STAT (SET/KITS/TRAYS/PACK) IMPLANT
TUBE SUCTION HIGH CAP CLEAR NV (SUCTIONS) ×2 IMPLANT
WRAP KNEE MAXI GEL POST OP (GAUZE/BANDAGES/DRESSINGS) ×2 IMPLANT

## 2022-08-04 NOTE — Anesthesia Procedure Notes (Addendum)
Procedure Name: MAC Date/Time: 08/04/2022 9:36 AM  Performed by: Rise Patience, CRNAPre-anesthesia Checklist: Patient identified, Emergency Drugs available, Suction available, Patient being monitored and Timeout performed Patient Re-evaluated:Patient Re-evaluated prior to induction Oxygen Delivery Method: Simple face mask Dental Injury: Teeth and Oropharynx as per pre-operative assessment

## 2022-08-04 NOTE — Anesthesia Procedure Notes (Signed)
Anesthesia Regional Block: Adductor canal block   Pre-Anesthetic Checklist: , timeout performed,  Correct Patient, Correct Site, Correct Laterality,  Correct Procedure, Correct Position, site marked,  Risks and benefits discussed,  Surgical consent,  Pre-op evaluation,  At surgeon's request and post-op pain management  Laterality: Left  Prep: Maximum Sterile Barrier Precautions used, chloraprep       Needles:  Injection technique: Single-shot  Needle Type: Echogenic Stimulator Needle     Needle Length: 9cm  Needle Gauge: 22     Additional Needles:   Procedures:,,,, ultrasound used (permanent image in chart),,    Narrative:  Start time: 08/04/2022 7:15 AM End time: 08/04/2022 7:20 AM Injection made incrementally with aspirations every 5 mL.  Performed by: Personally  Anesthesiologist: Lannie Fields, DO  Additional Notes: Monitors applied. No increased pain on injection. No increased resistance to injection. Injection made in 5cc increments. Good needle visualization. Patient tolerated procedure well.

## 2022-08-04 NOTE — Evaluation (Signed)
Physical Therapy Evaluation Patient Details Name: Jamie Garza MRN: [id number] DOB: 05-24-1955 Today's Date: 08/04/2022  History of Present Illness  67 yo female presents to therapy s/p L TKA on 08/04/2022 due to failure of conservative measures. Pt PMH includes but is not limited to: CAD, L radial fx, HTN, OSA, spinal stenosis, and pulmonary HTN.  Clinical Impression    Jamie Garza is a 67 y.o. female POD 0 s/p L TKA. Patient reports IND with mobility at baseline. Patient is now limited by functional impairments (see PT problem list below) and requires min guard for supine <> sit and min guard for transfers for bed and recliner transfers, LOB with commode transfer attributed to L LE instability requiring min A for recovery with RW. Patient was able to ambulate 40 feet x 2 with LLE instability and min guard to occasional min A to and from the bathroom with RW and cues. PT elected to have pt participate with LE TE for HEP and then re-assess gait pattern and stability 20 feet x 2  with RW and min guard instructing pt on step to pattern and maintaining LLE in RW/BOS for safe walker management with improved safety and stability. Patient and spouse educated on safe sequencing for stair mobility with use of one handrail and HHA with min guard and cues, car transfers, pain management, fall risk prevention, use of CP/ice and both verbalized understanding of safe guarding position for people assisting with mobility. Patient instructed in exercises to facilitate ROM and circulation reviewed and HO provided. Patient will benefit from continued skilled PT interventions to address impairments and progress towards PLOF. Patient has met mobility goals at adequate level for discharge home with family support and OPPT services; will continue to follow if pt continues acute stay to progress towards Mod I goals.       Assistance Recommended at Discharge Intermittent Supervision/Assistance  If plan is discharge home,  recommend the following:  Can travel by private vehicle  A little help with walking and/or transfers;A little help with bathing/dressing/bathroom;Assistance with cooking/housework;Assist for transportation;Help with stairs or ramp for entrance        Equipment Recommendations None recommended by PT (pt has DME in home setting)  Recommendations for Other Services       Functional Status Assessment Patient has had a recent decline in their functional status and demonstrates the ability to make significant improvements in function in a reasonable and predictable amount of time.     Precautions / Restrictions Precautions Precautions: Knee;Fall Restrictions Weight Bearing Restrictions: No      Mobility  Bed Mobility Overal bed mobility: Needs Assistance Bed Mobility: Supine to Sit     Supine to sit: Supervision     General bed mobility comments: min cues    Transfers Overall transfer level: Needs assistance Equipment used: Rolling walker (2 wheels) Transfers: Sit to/from Stand Sit to Stand: Min guard (min A due to instability with commode transfer)           General transfer comment: min cues for proper UE placement    Ambulation/Gait Ambulation/Gait assistance: Min guard Gait Distance (Feet): 40 Feet Assistive device: Rolling walker (2 wheels) Gait Pattern/deviations: Step-to pattern, Antalgic, Trunk flexed (L LE instability)       General Gait Details: cues for use of B UE support in L stance phase for improved stability  Stairs Stairs: Yes Stairs assistance: Min guard Stair Management: One rail Right Number of Stairs: 4 General stair comments: first bout with  step navigation B handrails with min guard and cues, second bout with husband demonstrating HHA on L and use of R handrail with min cues no L LE instability noted with step navigation  Wheelchair Mobility     Tilt Bed    Modified Rankin (Stroke Patients Only)       Balance Overall balance  assessment: Needs assistance Sitting-balance support: Feet supported Sitting balance-Leahy Scale: Good     Standing balance support: Bilateral upper extremity supported, During functional activity, Reliant on assistive device for balance Standing balance-Leahy Scale: Poor                               Pertinent Vitals/Pain Pain Assessment Pain Assessment: 0-10 Pain Score: 1  Pain Location: L knee Pain Descriptors / Indicators: Aching, Discomfort, Operative site guarding Pain Intervention(s): Limited activity within patient's tolerance, Monitored during session, Premedicated before session, Repositioned, Ice applied    Home Living Family/patient expects to be discharged to:: Private residence Living Arrangements: Spouse/significant other Available Help at Discharge: Family Type of Home: House Home Access: Stairs to enter Entrance Stairs-Rails: Doctor, general practice of Steps: 2 Alternate Level Stairs-Number of Steps: flight Home Layout: Two level;Able to live on main level with bedroom/bathroom Home Equipment: Rolling Walker (2 wheels);Cane - single point      Prior Function Prior Level of Function : Independent/Modified Independent;Driving;Working/employed             Mobility Comments: IND with all ADLs, self care tasks and no AD       Hand Dominance        Extremity/Trunk Assessment        Lower Extremity Assessment Lower Extremity Assessment: LLE deficits/detail LLE Deficits / Details: ankle DF/PF 5/5: SLR < 10 degree lag (noted LLE instability with gait tasks) LLE Sensation: decreased proprioception;decreased light touch    Cervical / Trunk Assessment Cervical / Trunk Assessment:  (wfl)  Communication   Communication: No difficulties  Cognition Arousal/Alertness: Awake/alert Behavior During Therapy: WFL for tasks assessed/performed Overall Cognitive Status: Within Functional Limits for tasks assessed                                           General Comments      Exercises Total Joint Exercises Ankle Circles/Pumps: AROM, Both, 20 reps Quad Sets: AROM, Left, 5 reps Short Arc Quad: AROM, Left, 5 reps Heel Slides: AROM, Left, 5 reps Hip ABduction/ADduction: AROM, Left, 5 reps, Supine Straight Leg Raises: AROM, Left, 5 reps Knee Flexion: AROM, Left, 5 reps, Seated   Assessment/Plan    PT Assessment Patient needs continued PT services  PT Problem List Decreased range of motion;Decreased strength;Decreased activity tolerance;Decreased balance;Decreased mobility;Decreased coordination;Pain       PT Treatment Interventions DME instruction;Gait training;Stair training;Functional mobility training;Therapeutic activities;Therapeutic exercise;Balance training;Neuromuscular re-education;Patient/family education;Modalities    PT Goals (Current goals can be found in the Care Plan section)  Acute Rehab PT Goals Patient Stated Goal: get back to exercise program, walking and get ready for curise next year PT Goal Formulation: With patient Time For Goal Achievement: 08/18/22 Potential to Achieve Goals: Good    Frequency 7X/week     Co-evaluation               AM-PAC PT "6 Clicks" Mobility  Outcome Measure Help needed turning from your back to  your side while in a flat bed without using bedrails?: None Help needed moving from lying on your back to sitting on the side of a flat bed without using bedrails?: A Little Help needed moving to and from a bed to a chair (including a wheelchair)?: A Little Help needed standing up from a chair using your arms (e.g., wheelchair or bedside chair)?: A Little Help needed to walk in hospital room?: A Little Help needed climbing 3-5 steps with a railing? : A Little 6 Click Score: 19    End of Session Equipment Utilized During Treatment: Gait belt Activity Tolerance: Patient tolerated treatment well Patient left: in chair;with call bell/phone within  reach;with family/visitor present Nurse Communication: Mobility status;Other (comment) (pt readiness for d/c from PT standpoint) PT Visit Diagnosis: Unsteadiness on feet (R26.81);Other abnormalities of gait and mobility (R26.89);Muscle weakness (generalized) (M62.81);Pain Pain - Right/Left: Left Pain - part of body: Knee;Leg    Time: 2130-8657 PT Time Calculation (min) (ACUTE ONLY): 59 min   Charges:   PT Evaluation $PT Eval Low Complexity: 1 Low PT Treatments $Gait Training: 8-22 mins $Therapeutic Exercise: 8-22 mins $Therapeutic Activity: 8-22 mins PT General Charges $$ ACUTE PT VISIT: 1 Visit         Johnny Bridge, PT Acute Rehab   Jacqualyn Posey 08/04/2022, 6:50 PM

## 2022-08-04 NOTE — Anesthesia Postprocedure Evaluation (Signed)
Anesthesia Post Note  Patient: ELIVIA ROBOTHAM  Procedure(s) Performed: TOTAL KNEE ARTHROPLASTY (Left: Knee)     Patient location during evaluation: PACU Anesthesia Type: Regional, MAC and Spinal Level of consciousness: awake and alert and oriented Pain management: pain level controlled Vital Signs Assessment: post-procedure vital signs reviewed and stable Respiratory status: spontaneous breathing, nonlabored ventilation and respiratory function stable Cardiovascular status: blood pressure returned to baseline and stable Postop Assessment: no headache, no backache, spinal receding and patient able to bend at knees Anesthetic complications: no   No notable events documented.  Last Vitals:  Vitals:   08/04/22 1100 08/04/22 1115  BP: 127/67 (!) 130/58  Pulse: 62 68  Resp: 17 18  Temp:    SpO2: 98% 99%    Last Pain:  Vitals:   08/04/22 1100  TempSrc:   PainSc: 0-No pain                 Lannie Fields

## 2022-08-04 NOTE — Anesthesia Procedure Notes (Signed)
Spinal  Patient location during procedure: OR Start time: 08/04/2022 8:52 AM End time: 08/04/2022 8:55 AM Reason for block: surgical anesthesia Staffing Performed: anesthesiologist  Anesthesiologist: Lannie Fields, DO Performed by: Lannie Fields, DO Authorized by: Lannie Fields, DO   Preanesthetic Checklist Completed: patient identified, IV checked, risks and benefits discussed, surgical consent, monitors and equipment checked, pre-op evaluation and timeout performed Spinal Block Patient position: sitting Prep: DuraPrep and site prepped and draped Patient monitoring: cardiac monitor, continuous pulse ox and blood pressure Approach: midline Location: L3-4 Injection technique: single-shot Needle Needle type: Pencan  Needle gauge: 24 G Needle length: 9 cm Assessment Sensory level: T6 Events: CSF return Additional Notes Functioning IV was confirmed and monitors were applied. Sterile prep and drape, including hand hygiene and sterile gloves were used. The patient was positioned and the spine was prepped. The skin was anesthetized with lidocaine.  Free flow of clear CSF was obtained prior to injecting local anesthetic into the CSF.  The spinal needle aspirated freely following injection.  The needle was carefully withdrawn.  The patient tolerated the procedure well.

## 2022-08-04 NOTE — Op Note (Signed)
DATE OF SURGERY:  08/04/2022 TIME: 9:56 AM  PATIENT NAME:  Jamie Garza   AGE: 67 y.o.    PRE-OPERATIVE DIAGNOSIS:  OA LEFT KNEE  POST-OPERATIVE DIAGNOSIS:  Same  PROCEDURE:  Procedure(s): TOTAL KNEE ARTHROPLASTY   SURGEON:  Sheral Apley, MD   ASSISTANT:  Levester Fresh, PA-C, she was present and scrubbed throughout the case, critical for completion in a timely fashion, and for retraction, instrumentation, and closure.    OPERATIVE IMPLANTS: [employer] Triathlon CR. Press fit knee  Femur size 4, Tibia size 4, Patella size 32 3-peg oval button, with a 10 mm polyethylene insert.   PREOPERATIVE INDICATIONS:  Jamie Garza is a 67 y.o. year old female with end stage bone on bone degenerative arthritis of the knee who failed conservative treatment, including injections, antiinflammatories, activity modification, and assistive devices, and had significant impairment of their activities of daily living, and elected for Total Knee Arthroplasty.   The risks, benefits, and alternatives were discussed at length including but not limited to the risks of infection, bleeding, nerve injury, stiffness, blood clots, the need for revision surgery, cardiopulmonary complications, among others, and they were willing to proceed.   OPERATIVE DESCRIPTION:  The patient was brought to the operative room and placed in a supine position.  General anesthesia was administered.  IV antibiotics were given.  The lower extremity was prepped and draped in the usual sterile fashion.  Time out was performed.  The leg was elevated and exsanguinated and the tourniquet was inflated.  Anterior approach was performed.  The patella was everted and osteophytes were removed.  The anterior horn of the medial and lateral meniscus was removed.   The distal femur was opened with the drill and the intramedullary distal femoral cutting jig was utilized, set at 5 degrees resecting 9 mm off the distal femur.  Care was taken to  protect the collateral ligaments.  The distal femoral sizing jig was applied, taking care to avoid notching.  Then the 4-in-1 cutting jig was applied and the anterior and posterior femur was cut, along with the chamfer cuts.  All posterior osteophytes were removed.  The flexion gap was then measured and was symmetric with the extension gap.  Then the extramedullary tibial cutting jig was utilized making the appropriate cut using the anterior tibial crest as a reference building in appropriate posterior slope.  Care was taken during the cut to protect the medial and collateral ligaments.  The proximal tibia was removed along with the posterior horns of the menisci.    I completed the distal femoral preparation using the appropriate jig to prepare the box.  The patella was then measured, and cut with the saw.    The proximal tibia sized and prepared accordingly with the reamer and the punch, and then all components were trialed with the above sized poly insert.  The knee was found to have excellent balance and full motion.    The above named components were then impacted into place and Poly tibial piece and patella were inserted.  I was very happy with his stability and ROM  I performed a periarticular injection with Exparel  The knee was easily taken through a range of motion and the patella tracked well and the knee irrigated copiously and the parapatellar and subcutaneous tissue closed with vicryl, and monocryl with steri strips for the skin.  The incision was dressed with sterile gauze and the tourniquet released and the patient was awakened and returned to the  PACU in stable and satisfactory condition.  There were no complications.  Total tourniquet time was roughly 75 minutes.   POSTOPERATIVE PLAN: post op Abx, DVT px: SCD's, TED's, Early ambulation and chemical px

## 2022-08-04 NOTE — Interval H&P Note (Signed)
History and Physical Interval Note:  08/04/2022 7:08 AM  Jamie Garza  has presented today for surgery, with the diagnosis of OA LEFT KNEE.  The various methods of treatment have been discussed with the patient and family. After consideration of risks, benefits and other options for treatment, the patient has consented to  Procedure(s): TOTAL KNEE ARTHROPLASTY (Left) as a surgical intervention.  The patient's history has been reviewed, patient examined, no change in status, stable for surgery.  I have reviewed the patient's chart and labs.  Questions were answered to the patient's satisfaction.     Sheral Apley

## 2022-08-04 NOTE — Transfer of Care (Signed)
Immediate Anesthesia Transfer of Care Note  Patient: Jamie Garza  Procedure(s) Performed: TOTAL KNEE ARTHROPLASTY (Left: Knee)  Patient Location: PACU  Anesthesia Type:MAC combined with regional for post-op pain  Level of Consciousness: awake, alert , oriented, and patient cooperative  Airway & Oxygen Therapy: Patient connected to face mask oxygen  Post-op Assessment: Report given to RN and Post -op Vital signs reviewed and stable  Post vital signs: Reviewed and stable  Last Vitals:  Vitals Value Taken Time  BP 118/61 08/04/22 1039  Temp    Pulse 68 08/04/22 1043  Resp 13 08/04/22 1043  SpO2 100 % 08/04/22 1043  Vitals shown include unfiled device data.  Last Pain:  Vitals:   08/04/22 0725  TempSrc:   PainSc: 0-No pain         Complications: No notable events documented.

## 2022-08-05 ENCOUNTER — Other Ambulatory Visit: Payer: Self-pay | Admitting: Cardiology

## 2022-08-06 DIAGNOSIS — Z96652 Presence of left artificial knee joint: Secondary | ICD-10-CM | POA: Diagnosis not present

## 2022-08-10 DIAGNOSIS — Z96652 Presence of left artificial knee joint: Secondary | ICD-10-CM | POA: Diagnosis not present

## 2022-08-11 ENCOUNTER — Encounter: Payer: Self-pay | Admitting: Family Medicine

## 2022-08-12 ENCOUNTER — Encounter (HOSPITAL_COMMUNITY): Payer: Self-pay | Admitting: Orthopedic Surgery

## 2022-08-12 DIAGNOSIS — Z96652 Presence of left artificial knee joint: Secondary | ICD-10-CM | POA: Diagnosis not present

## 2022-08-13 DIAGNOSIS — Z96652 Presence of left artificial knee joint: Secondary | ICD-10-CM | POA: Diagnosis not present

## 2022-08-17 DIAGNOSIS — Z96652 Presence of left artificial knee joint: Secondary | ICD-10-CM | POA: Diagnosis not present

## 2022-08-19 DIAGNOSIS — M1712 Unilateral primary osteoarthritis, left knee: Secondary | ICD-10-CM | POA: Diagnosis not present

## 2022-08-19 DIAGNOSIS — Z96652 Presence of left artificial knee joint: Secondary | ICD-10-CM | POA: Diagnosis not present

## 2022-08-21 DIAGNOSIS — Z96652 Presence of left artificial knee joint: Secondary | ICD-10-CM | POA: Diagnosis not present

## 2022-08-26 DIAGNOSIS — Z96652 Presence of left artificial knee joint: Secondary | ICD-10-CM | POA: Diagnosis not present

## 2022-08-28 DIAGNOSIS — Z96652 Presence of left artificial knee joint: Secondary | ICD-10-CM | POA: Diagnosis not present

## 2022-08-31 DIAGNOSIS — Z96652 Presence of left artificial knee joint: Secondary | ICD-10-CM | POA: Diagnosis not present

## 2022-09-03 DIAGNOSIS — Z96652 Presence of left artificial knee joint: Secondary | ICD-10-CM | POA: Diagnosis not present

## 2022-09-07 DIAGNOSIS — Z96652 Presence of left artificial knee joint: Secondary | ICD-10-CM | POA: Diagnosis not present

## 2022-09-10 DIAGNOSIS — Z96652 Presence of left artificial knee joint: Secondary | ICD-10-CM | POA: Diagnosis not present

## 2022-09-14 DIAGNOSIS — Z96652 Presence of left artificial knee joint: Secondary | ICD-10-CM | POA: Diagnosis not present

## 2022-09-16 DIAGNOSIS — M25571 Pain in right ankle and joints of right foot: Secondary | ICD-10-CM | POA: Diagnosis not present

## 2022-09-17 DIAGNOSIS — Z96652 Presence of left artificial knee joint: Secondary | ICD-10-CM | POA: Diagnosis not present

## 2022-09-23 DIAGNOSIS — Z96652 Presence of left artificial knee joint: Secondary | ICD-10-CM | POA: Diagnosis not present

## 2022-09-24 DIAGNOSIS — Z6826 Body mass index (BMI) 26.0-26.9, adult: Secondary | ICD-10-CM | POA: Diagnosis not present

## 2022-09-24 DIAGNOSIS — M4310 Spondylolisthesis, site unspecified: Secondary | ICD-10-CM | POA: Diagnosis not present

## 2022-09-30 ENCOUNTER — Ambulatory Visit: Payer: Medicare Other | Admitting: Podiatry

## 2022-09-30 ENCOUNTER — Ambulatory Visit (INDEPENDENT_AMBULATORY_CARE_PROVIDER_SITE_OTHER): Payer: Medicare Other

## 2022-09-30 ENCOUNTER — Encounter: Payer: Self-pay | Admitting: Podiatry

## 2022-09-30 DIAGNOSIS — M778 Other enthesopathies, not elsewhere classified: Secondary | ICD-10-CM | POA: Diagnosis not present

## 2022-09-30 DIAGNOSIS — R600 Localized edema: Secondary | ICD-10-CM | POA: Diagnosis not present

## 2022-09-30 DIAGNOSIS — M7751 Other enthesopathy of right foot: Secondary | ICD-10-CM

## 2022-09-30 DIAGNOSIS — Z96652 Presence of left artificial knee joint: Secondary | ICD-10-CM | POA: Diagnosis not present

## 2022-09-30 MED ORDER — TRIAMCINOLONE ACETONIDE 10 MG/ML IJ SUSP
10.0000 mg | Freq: Once | INTRAMUSCULAR | Status: AC
Start: 2022-09-30 — End: 2022-09-30
  Administered 2022-09-30: 10 mg via INTRA_ARTICULAR

## 2022-09-30 NOTE — Progress Notes (Signed)
Subjective:   Patient ID: Jamie Garza, female   DOB: 67 y.o.   MRN: [id number]   HPI Patient presents stating she had a knee replacement 8 weeks ago she has had swelling in her left foot and ankle since the replacement and also on the right she has developed a lot of pain around the big toe joint that is been sore with bunion deformity.  Patient states that is been going on for several months patient does not smoke likes to be active   Review of Systems  All other systems reviewed and are negative.       Objective:  Physical Exam Vitals and nursing note reviewed.  Constitutional:      Appearance: She is well-developed.  Pulmonary:     Effort: Pulmonary effort is normal.  Musculoskeletal:        General: Normal range of motion.  Skin:    General: Skin is warm.  Neurological:     Mental Status: She is alert.     Neurovascular status intact muscle strength found to be adequate range of motion found to be within normal limits with +1 to +2 pitting edema of the left midfoot into the ankle negative Denna Haggard' sign noted with history of knee replacement 8 weeks ago.  Right shows inflammation fluid around the first MPJ reduced range of motion no crepitus of the joint with structural bunion also noted     Assessment:  Inflammatory capsulitis first MPJ right with hallux rigidus limitus rigidus deformity with edema left secondary to the process       Plan:  H&P all conditions reviewed and discussed.  Ankle compression stocking 30 to 40 mm dispensed the left and for the right sterile prep did periarticular injection around the first MPJ 3 mg Kenalog 5 mg Xylocaine tolerated well and advised on shoe gear that is comfortable to wear  X-rays indicate there is arthritis first MPJ right spur formation elevation of the ankle left shows arthritis midtarsal joint no other pathology noted

## 2022-10-25 NOTE — Progress Notes (Unsigned)
Subjective:    Patient ID: Jamie Garza, female    DOB: 12-24-1955, 67 y.o.   MRN: [id number]  HPI  Here for health maintenance exam and to review chronic medical problems   Wt Readings from Last 3 Encounters:  10/26/22 139 lb 8 oz (63.3 kg)  08/04/22 144 lb (65.3 kg)  07/22/22 144 lb (65.3 kg)   26.36 kg/m  Vitals:   10/26/22 0828  BP: 118/64  Pulse: 63  Temp: 98.3 F (36.8 C)  SpO2: 98%    Immunization History  Administered Date(s) Administered   Fluad Quad(high Dose 65+) 10/21/2020, 10/23/2021   Influenza Whole 11/20/2008   Influenza, High Dose Seasonal PF 10/15/2022   Influenza,inj,Quad PF,6+ Mos 10/21/2015, 10/22/2016, 11/01/2018   Influenza-Unspecified 10/20/2013, 09/19/2017   PFIZER(Purple Top)SARS-COV-2 Vaccination 03/14/2019, 04/06/2019   PNEUMOCOCCAL CONJUGATE-20 10/21/2020   Pfizer Covid-19 Vaccine Bivalent Booster 35yrs & up 08/09/2020   Pfizer(Comirnaty)Fall Seasonal Vaccine 12 years and older 10/26/2021, 10/12/2022   Td 11/20/2008   Tdap 11/01/2018   Zoster Recombinant(Shingrix) 08/30/2018, 12/20/2018   Zoster, Live 10/28/2015    There are no preventive care reminders to display for this patient.  Flu shot-had this season   Had TKA in July  It went well  Still some swelling   Dealing with spinal stenosis and spondylolisthesis Causes spasms  Sees neuro   Thinks one leg is longer than the other   Sees podiatrist  Has had orthotics in past/needs new ones     Mammogram- 10/2021- next one is scheduled for later this month  Self breast exam- no lumps   Gyn health no problems    Colon cancer screening - 06/2020 colonoscopy  10 y recall   Bone health  Osteopenia  Dexa  10/2020  has this scheduled in feb  Falls- none  Fractures-none  Supplements -ca and D (tums for calcium)  Exercise - walking / new knee  Has bike and arc trainer  Some weights at home    Mood    10/26/2022    8:32 AM 06/11/2022    8:35 AM 05/20/2022    4:07 PM  05/27/2021   10:37 AM 10/21/2020    8:35 AM  Depression screen PHQ 2/9  Decreased Interest 0 0 0 0 0  Down, Depressed, Hopeless 0 0 0 0 0  PHQ - 2 Score 0 0 0 0 0  Altered sleeping 0 0 0    Tired, decreased energy 0 0 1    Change in appetite 0 0 0    Feeling bad or failure about yourself  0 0 0    Trouble concentrating 0 0 0    Moving slowly or fidgety/restless 0 0 0    Suicidal thoughts 0 0 0    PHQ-9 Score 0 0 1    Difficult doing work/chores Not difficult at all Not difficult at all Not difficult at all      HTN bp is stable today  No cp or palpitations or headaches or edema  No side effects to medicines  BP Readings from Last 3 Encounters:  10/26/22 118/64  08/04/22 138/79  07/22/22 137/74    Losartan 100 mg daily   Lab Results  Component Value Date   NA 138 07/22/2022   K 4.6 07/22/2022   CO2 24 07/22/2022   GLUCOSE 97 07/22/2022   BUN 18 07/22/2022   CREATININE 0.68 07/22/2022   CALCIUM 9.1 07/22/2022   GFR 90.69 10/23/2021   GFRNONAA >60 07/22/2022  Hyperlipidemia Lab Results  Component Value Date   CHOL 115 10/23/2021   HDL 47.00 10/23/2021   LDLCALC 54 10/23/2021   LDLDIRECT 137.8 12/03/2009   TRIG 70.0 10/23/2021   CHOLHDL 2 10/23/2021   Atorvastatin 40 mg daily   Eats fairly healthy  Wants to loose 10 lb    Patient Active Problem List   Diagnosis Date Noted   Tricuspid regurgitation 07/06/2022   Aortic atherosclerosis (HCC) 05/20/2022   Osteopenia 10/23/2021   Post-nasal drip 09/04/2021   Estrogen deficiency 10/21/2020   Tinnitus 04/13/2020   CAD (coronary artery disease), native coronary artery    Pulmonary nodule 06/01/2019   H/O cold sores 01/04/2019   Family history of heart disease 08/05/2018   Essential hypertension 06/14/2018   Spinal stenosis 09/02/2016   Hyperlipidemia 09/02/2016   Routine general medical examination at a health care facility 12/15/2010   Insomnia 11/20/2008   Sleep apnea 01/04/2007   Past Medical  History:  Diagnosis Date   Arthritis    CAD (coronary artery disease), native coronary artery    coronary CTA with 25-49% prox LAD and coronary Ca score of 242 in 09/2018   Depression with anxiety 10/30/2011   Distal radius fracture, left 05/23/2011   Essential hypertension 06/14/2018   History of radius fracture 05/23/2011   Of note- nl dexa 2013-not a fragility fracture    Hyperlipidemia 09/02/2016   Insomnia    Pulmonary hypertension (HCC)    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 10/2018   SLEEP APNEA 01/04/2007   Qualifier: Diagnosis of  By: Jillyn Hidden FNP, Mcarthur Rossetti    Spinal stenosis, lumbar    L4 and L5 - severe; L3 and L4 - moderate   Stress reaction 07/18/2018   Tricuspid regurgitation    moderate by echo 06/2022   Wrist fracture 11/2019   right    Past Surgical History:  Procedure Laterality Date   CARPAL TUNNEL RELEASE Right 12/2018   COLONOSCOPY  08/2009   Jarold Motto  tic's   TOTAL KNEE ARTHROPLASTY Left 08/04/2022   Procedure: TOTAL KNEE ARTHROPLASTY;  Surgeon: Sheral Apley, MD;  Location: [facility];  Service: Orthopedics;  Laterality: Left;   WRIST FRACTURE SURGERY  2013   Social History   Tobacco Use   Smoking status: Never   Smokeless tobacco: Never  Vaping Use   Vaping status: Never Used  Substance Use Topics   Alcohol use: Yes    Comment: occasional   Drug use: No   Family History  Problem Relation Age of Onset   Diabetes Mother        borderline DM   Aortic stenosis Mother    Diabetes Maternal Aunt    Diabetes Maternal Grandfather    Diabetes Paternal Grandfather    CAD Brother    Aortic stenosis Brother    Breast cancer Neg Hx    Colon cancer Neg Hx    Colon polyps Neg Hx    Esophageal cancer Neg Hx    Rectal cancer Neg Hx    Stomach cancer Neg Hx    No Known Allergies Current Outpatient Medications on File Prior to Visit  Medication Sig Dispense Refill   atorvastatin (LIPITOR) 40 MG tablet Take 1 tablet (40 mg total) by  mouth daily. 90 tablet 3   benzonatate (TESSALON) 200 MG capsule Take 1 capsule (200 mg total) by mouth 3 (three) times daily as needed for cough. Do not bite pill 30 capsule 1   betamethasone dipropionate  0.05 % lotion Apply 1 application  topically daily as needed (dry scalp).     Ca Carbonate-Mag Hydroxide (ROLAIDS EXTRA STRENGTH PO) Take 1 tablet by mouth every morning. Takes for Calcium supplement     Cholecalciferol (VITAMIN D3) 50 MCG (2000 UT) capsule Take 2,000 Units by mouth daily.     losartan (COZAAR) 100 MG tablet TAKE ONE TABLET BY MOUTH EVERY DAY 90 tablet 3   metroNIDAZOLE (METROGEL) 0.75 % gel Apply topically. Apply topically daily Rosacea Cream     Propylene Glycol (SYSTANE COMPLETE) 0.6 % SOLN Place 1 drop into both eyes daily as needed (Dry eyes).     traZODone (DESYREL) 50 MG tablet TAKE ONE TABLET BY MOUTH AT BEDTIME 90 tablet 2   TURMERIC PO Take 2,000 mg by mouth daily. 1000 mg each     valACYclovir (VALTREX) 1000 MG tablet TAKE TWO TABLETS BY MOUTH EVERY TWELVE HOURS FOR ONE DAY FOR FEVER BLISTER/COLD SORE (Patient taking differently: Take 1,000 mg by mouth every 12 (twelve) hours as needed (fever blisters/ cold sores).) 4 tablet 11   No current facility-administered medications on file prior to visit.    Review of Systems  Constitutional:  Negative for activity change, appetite change, fatigue, fever and unexpected weight change.  HENT:  Negative for congestion, ear pain, rhinorrhea, sinus pressure and sore throat.   Eyes:  Negative for pain, redness and visual disturbance.  Respiratory:  Negative for cough, shortness of breath and wheezing.   Cardiovascular:  Negative for chest pain and palpitations.  Gastrointestinal:  Negative for abdominal pain, blood in stool, constipation and diarrhea.  Endocrine: Negative for polydipsia and polyuria.  Genitourinary:  Negative for dysuria, frequency and urgency.  Musculoskeletal:  Positive for arthralgias. Negative for back  pain and myalgias.  Skin:  Negative for pallor and rash.  Allergic/Immunologic: Negative for environmental allergies.  Neurological:  Negative for dizziness, syncope and headaches.  Hematological:  Negative for adenopathy. Does not bruise/bleed easily.  Psychiatric/Behavioral:  Negative for decreased concentration and dysphoric mood. The patient is not nervous/anxious.        Objective:   Physical Exam Constitutional:      General: She is not in acute distress.    Appearance: Normal appearance. She is well-developed and normal weight. She is not ill-appearing or diaphoretic.  HENT:     Head: Normocephalic and atraumatic.     Right Ear: Tympanic membrane, ear canal and external ear normal.     Left Ear: Tympanic membrane, ear canal and external ear normal.     Nose: Nose normal. No congestion.     Mouth/Throat:     Mouth: Mucous membranes are moist.     Pharynx: Oropharynx is clear. No posterior oropharyngeal erythema.  Eyes:     General: No scleral icterus.    Extraocular Movements: Extraocular movements intact.     Conjunctiva/sclera: Conjunctivae normal.     Pupils: Pupils are equal, round, and reactive to light.  Neck:     Thyroid: No thyromegaly.     Vascular: No carotid bruit or JVD.  Cardiovascular:     Rate and Rhythm: Normal rate and regular rhythm.     Pulses: Normal pulses.     Heart sounds: Normal heart sounds.     No gallop.  Pulmonary:     Effort: Pulmonary effort is normal. No respiratory distress.     Breath sounds: Normal breath sounds. No wheezing.     Comments: Good air exch Chest:  Chest wall: No tenderness.  Abdominal:     General: Bowel sounds are normal. There is no distension or abdominal bruit.     Palpations: Abdomen is soft. There is no mass.     Tenderness: There is no abdominal tenderness.     Hernia: No hernia is present.  Genitourinary:    Comments: Breast exam: No mass, nodules, thickening, tenderness, bulging, retraction, inflamation,  nipple discharge or skin changes noted.  No axillary or clavicular LA.     Musculoskeletal:        General: No tenderness. Normal range of motion.     Cervical back: Normal range of motion and neck supple. No rigidity. No muscular tenderness.     Right lower leg: No edema.     Left lower leg: No edema.     Comments: No kyphosis   Lymphadenopathy:     Cervical: No cervical adenopathy.  Skin:    General: Skin is warm and dry.     Coloration: Skin is not pale.     Findings: No erythema or rash.     Comments: Solar lentigines diffusely   Neurological:     Mental Status: She is alert. Mental status is at baseline.     Cranial Nerves: No cranial nerve deficit.     Motor: No abnormal muscle tone.     Coordination: Coordination normal.     Gait: Gait normal.     Deep Tendon Reflexes: Reflexes are normal and symmetric. Reflexes normal.  Psychiatric:        Mood and Affect: Mood normal.        Cognition and Memory: Cognition and memory normal.           Assessment & Plan:   Problem List Items Addressed This Visit       Cardiovascular and Mediastinum   Aortic atherosclerosis (HCC)    No clinical changes Blood pressure controlled On statin        CAD (coronary artery disease), native coronary artery    Takes statin Has seen cardiology  Good blood pressure control      Essential hypertension    bp in fair control at this time  BP Readings from Last 1 Encounters:  10/26/22 118/64   No changes needed Most recent labs reviewed  Disc lifstyle change with low sodium diet and exercise  Losartan 100 mg daily       Relevant Orders   TSH   Lipid panel   Comprehensive metabolic panel   CBC with Differential/Platelet     Musculoskeletal and Integument   Osteopenia    Dexa planned for 02/2023   Discussed fall prevention, supplements and exercise for bone density  No falls or fracture         Other   Hyperlipidemia    Disc goals for lipids and reasons to control  them Rev last labs with pt Rev low sat fat diet in detail Labs today  Taking atorvastatin 40 mg daily  History of CAD and aortic atherosclerosis       Relevant Orders   Lipid panel   Routine general medical examination at a health care facility - Primary    Reviewed health habits including diet and exercise and skin cancer prevention Reviewed appropriate screening tests for age  Also reviewed health mt list, fam hx and immunization status , as well as social and family history   See HPI Labs reviewed and ordered Discussed pros/cons of RSV vaccine  Mammogram and dexa are  scheduled Colonoscopy 06/2020 with 10 y recall if applicable  Discussed fall prevention, supplements and exercise for bone density   PHQ 0       Spinal stenosis    Sees a specialist  Making treatment plan   Refilled methocarbamol for spasms

## 2022-10-26 ENCOUNTER — Ambulatory Visit: Payer: Medicare Other | Admitting: Family Medicine

## 2022-10-26 ENCOUNTER — Encounter: Payer: Self-pay | Admitting: Family Medicine

## 2022-10-26 VITALS — BP 118/64 | HR 63 | Temp 98.3°F | Ht 61.0 in | Wt 139.5 lb

## 2022-10-26 DIAGNOSIS — M85859 Other specified disorders of bone density and structure, unspecified thigh: Secondary | ICD-10-CM | POA: Diagnosis not present

## 2022-10-26 DIAGNOSIS — M4800 Spinal stenosis, site unspecified: Secondary | ICD-10-CM

## 2022-10-26 DIAGNOSIS — E7800 Pure hypercholesterolemia, unspecified: Secondary | ICD-10-CM | POA: Diagnosis not present

## 2022-10-26 DIAGNOSIS — I10 Essential (primary) hypertension: Secondary | ICD-10-CM

## 2022-10-26 DIAGNOSIS — Z0000 Encounter for general adult medical examination without abnormal findings: Secondary | ICD-10-CM | POA: Diagnosis not present

## 2022-10-26 DIAGNOSIS — I700 Atherosclerosis of aorta: Secondary | ICD-10-CM

## 2022-10-26 DIAGNOSIS — D649 Anemia, unspecified: Secondary | ICD-10-CM | POA: Insufficient documentation

## 2022-10-26 DIAGNOSIS — D508 Other iron deficiency anemias: Secondary | ICD-10-CM

## 2022-10-26 DIAGNOSIS — I2510 Atherosclerotic heart disease of native coronary artery without angina pectoris: Secondary | ICD-10-CM

## 2022-10-26 LAB — COMPREHENSIVE METABOLIC PANEL
ALT: 22 U/L (ref 0–35)
AST: 23 U/L (ref 0–37)
Albumin: 4 g/dL (ref 3.5–5.2)
Alkaline Phosphatase: 66 U/L (ref 39–117)
BUN: 11 mg/dL (ref 6–23)
CO2: 28 meq/L (ref 19–32)
Calcium: 9.8 mg/dL (ref 8.4–10.5)
Chloride: 104 meq/L (ref 96–112)
Creatinine, Ser: 0.6 mg/dL (ref 0.40–1.20)
GFR: 92.48 mL/min (ref >60.00)
Glucose, Bld: 89 mg/dL (ref 70–99)
Potassium: 3.6 meq/L (ref 3.5–5.1)
Sodium: 140 meq/L (ref 135–145)
Total Bilirubin: 0.3 mg/dL (ref 0.2–1.2)
Total Protein: 7.1 g/dL (ref 6.0–8.3)

## 2022-10-26 LAB — LIPID PANEL
Cholesterol: 135 mg/dL (ref 0–200)
HDL: 54.3 mg/dL (ref >39.00)
LDL Cholesterol: 55 mg/dL (ref 0–99)
NonHDL: 80.37
Total CHOL/HDL Ratio: 2
Triglycerides: 127 mg/dL (ref 0.0–149.0)
VLDL: 25.4 mg/dL (ref 0.0–40.0)

## 2022-10-26 LAB — TSH: TSH: 2.26 u[IU]/mL (ref 0.35–5.50)

## 2022-10-26 LAB — CBC WITH DIFFERENTIAL/PLATELET
Basophils Absolute: 0 10*3/uL (ref 0.0–0.1)
Basophils Relative: 0.5 % (ref 0.0–3.0)
Eosinophils Absolute: 0.1 10*3/uL (ref 0.0–0.7)
Eosinophils Relative: 2.9 % (ref 0.0–5.0)
HCT: 32 % — ABNORMAL LOW (ref 36.0–46.0)
Hemoglobin: 9.9 g/dL — ABNORMAL LOW (ref 12.0–15.0)
Lymphocytes Relative: 45.6 % (ref 12.0–46.0)
Lymphs Abs: 1.9 10*3/uL (ref 0.7–4.0)
MCHC: 31 g/dL (ref 30.0–36.0)
MCV: 84 fL (ref 78.0–100.0)
Monocytes Absolute: 0.6 10*3/uL (ref 0.1–1.0)
Monocytes Relative: 13.2 % — ABNORMAL HIGH (ref 3.0–12.0)
Neutro Abs: 1.6 10*3/uL (ref 1.4–7.7)
Neutrophils Relative %: 37.8 % — ABNORMAL LOW (ref 43.0–77.0)
Platelets: 262 10*3/uL (ref 150.0–400.0)
RBC: 3.81 Mil/uL — ABNORMAL LOW (ref 3.87–5.11)
RDW: 18 % — ABNORMAL HIGH (ref 11.5–15.5)
WBC: 4.2 10*3/uL (ref 4.0–10.5)

## 2022-10-26 MED ORDER — METHOCARBAMOL 500 MG PO TABS: 500.0000 mg | ORAL_TABLET | Freq: Three times a day (TID) | ORAL | 3 refills | Status: DC | PRN

## 2022-10-26 NOTE — Assessment & Plan Note (Signed)
No clinical changes Blood pressure controlled On statin

## 2022-10-26 NOTE — Assessment & Plan Note (Signed)
Disc goals for lipids and reasons to control them Rev last labs with pt Rev low sat fat diet in detail Labs today  Taking atorvastatin 40 mg daily  History of CAD and aortic atherosclerosis

## 2022-10-26 NOTE — Assessment & Plan Note (Signed)
bp in fair control at this time  BP Readings from Last 1 Encounters:  10/26/22 118/64   No changes needed Most recent labs reviewed  Disc lifstyle change with low sodium diet and exercise  Losartan 100 mg daily

## 2022-10-26 NOTE — Assessment & Plan Note (Signed)
Dexa planned for 02/2023   Discussed fall prevention, supplements and exercise for bone density  No falls or fracture

## 2022-10-26 NOTE — Assessment & Plan Note (Signed)
Sees a specialist  Making treatment plan   Refilled methocarbamol for spasms

## 2022-10-26 NOTE — Patient Instructions (Addendum)
Continue walking as tolerated  Continue your PT   Add some strength training to your routine, this is important for bone and brain health and can reduce your risk of falls and help your body use insulin properly and regulate weight  Light weights, exercise bands , and internet videos are a good way to start  Yoga (chair or regular), machines , floor exercises or a gym with machines are also good options    Take care of yourself   Labs today   Get your mammogram and dexa when scheduled

## 2022-10-26 NOTE — Assessment & Plan Note (Signed)
Reviewed health habits including diet and exercise and skin cancer prevention Reviewed appropriate screening tests for age  Also reviewed health mt list, fam hx and immunization status , as well as social and family history   See HPI Labs reviewed and ordered Discussed pros/cons of RSV vaccine  Mammogram and dexa are scheduled Colonoscopy 06/2020 with 10 y recall if applicable  Discussed fall prevention, supplements and exercise for bone density   PHQ 0

## 2022-10-26 NOTE — Assessment & Plan Note (Signed)
Takes statin Has seen cardiology  Good blood pressure control

## 2022-10-26 NOTE — Addendum Note (Signed)
Addended by: Roxy Manns A on: 10/26/2022 07:55 PM   Modules accepted: Orders, Level of Service

## 2022-10-26 NOTE — Assessment & Plan Note (Signed)
Lab Results  Component Value Date   WBC 4.2 10/26/2022   HGB 9.9 (L) 10/26/2022   HCT 32.0 (L) 10/26/2022   MCV 84.0 10/26/2022   PLT 262.0 10/26/2022   Suspect due to recent ortho surgery  Suggest mvi with iron and re check 4-6 wk

## 2022-10-28 ENCOUNTER — Telehealth: Payer: Self-pay | Admitting: *Deleted

## 2022-10-28 NOTE — Telephone Encounter (Signed)
(  Pt viewed results via mychart already)   Please schedule 4-6 week non fasting lab appt (cbc and iron)

## 2022-10-29 NOTE — Telephone Encounter (Signed)
Spoke to pt, pt states she'll get labs done at [facility] since its closer to home.

## 2022-11-12 ENCOUNTER — Ambulatory Visit
Admission: RE | Admit: 2022-11-12 | Discharge: 2022-11-12 | Disposition: A | Discharge status: Home / Self Care | Payer: Medicare Other | Source: Ambulatory Visit | Attending: Family Medicine | Admitting: Family Medicine

## 2022-11-12 DIAGNOSIS — Z1231 Encounter for screening mammogram for malignant neoplasm of breast: Secondary | ICD-10-CM | POA: Diagnosis not present

## 2022-11-17 DIAGNOSIS — H903 Sensorineural hearing loss, bilateral: Secondary | ICD-10-CM | POA: Diagnosis not present

## 2022-11-17 DIAGNOSIS — H6121 Impacted cerumen, right ear: Secondary | ICD-10-CM | POA: Diagnosis not present

## 2022-11-24 DIAGNOSIS — D485 Neoplasm of uncertain behavior of skin: Secondary | ICD-10-CM | POA: Diagnosis not present

## 2022-11-24 DIAGNOSIS — D224 Melanocytic nevi of scalp and neck: Secondary | ICD-10-CM | POA: Diagnosis not present

## 2022-11-24 DIAGNOSIS — D2272 Melanocytic nevi of left lower limb, including hip: Secondary | ICD-10-CM | POA: Diagnosis not present

## 2022-11-24 DIAGNOSIS — L570 Actinic keratosis: Secondary | ICD-10-CM | POA: Diagnosis not present

## 2022-11-24 DIAGNOSIS — L820 Inflamed seborrheic keratosis: Secondary | ICD-10-CM | POA: Diagnosis not present

## 2022-11-24 DIAGNOSIS — L718 Other rosacea: Secondary | ICD-10-CM | POA: Diagnosis not present

## 2022-11-24 DIAGNOSIS — D2261 Melanocytic nevi of right upper limb, including shoulder: Secondary | ICD-10-CM | POA: Diagnosis not present

## 2022-12-04 ENCOUNTER — Other Ambulatory Visit (INDEPENDENT_AMBULATORY_CARE_PROVIDER_SITE_OTHER): Payer: Medicare Other

## 2022-12-04 DIAGNOSIS — D508 Other iron deficiency anemias: Secondary | ICD-10-CM

## 2022-12-04 LAB — CBC WITH DIFFERENTIAL/PLATELET
Basophils Absolute: 0 10*3/uL (ref 0.0–0.1)
Basophils Relative: 0.4 % (ref 0.0–3.0)
Eosinophils Absolute: 0.1 10*3/uL (ref 0.0–0.7)
Eosinophils Relative: 2.2 % (ref 0.0–5.0)
HCT: 36.8 % (ref 36.0–46.0)
Hemoglobin: 11.5 g/dL — ABNORMAL LOW (ref 12.0–15.0)
Lymphocytes Relative: 51.2 % — ABNORMAL HIGH (ref 12.0–46.0)
Lymphs Abs: 2.5 10*3/uL (ref 0.7–4.0)
MCHC: 31.3 g/dL (ref 30.0–36.0)
MCV: 84.4 fL (ref 78.0–100.0)
Monocytes Absolute: 0.6 10*3/uL (ref 0.1–1.0)
Monocytes Relative: 12.1 % — ABNORMAL HIGH (ref 3.0–12.0)
Neutro Abs: 1.7 10*3/uL (ref 1.4–7.7)
Neutrophils Relative %: 34.1 % — ABNORMAL LOW (ref 43.0–77.0)
Platelets: 261 10*3/uL (ref 150.0–400.0)
RBC: 4.36 Mil/uL (ref 3.87–5.11)
RDW: 21.6 % — ABNORMAL HIGH (ref 11.5–15.5)
WBC: 5 10*3/uL (ref 4.0–10.5)

## 2022-12-04 LAB — IRON: Iron: 34 ug/dL — ABNORMAL LOW (ref 42–145)

## 2022-12-25 DIAGNOSIS — L438 Other lichen planus: Secondary | ICD-10-CM | POA: Diagnosis not present

## 2023-01-29 ENCOUNTER — Other Ambulatory Visit: Payer: Self-pay | Admitting: Cardiology

## 2023-01-29 ENCOUNTER — Other Ambulatory Visit: Payer: Self-pay | Admitting: Family Medicine

## 2023-02-02 DIAGNOSIS — Z6827 Body mass index (BMI) 27.0-27.9, adult: Secondary | ICD-10-CM | POA: Diagnosis not present

## 2023-02-02 DIAGNOSIS — M4310 Spondylolisthesis, site unspecified: Secondary | ICD-10-CM | POA: Diagnosis not present

## 2023-02-16 DIAGNOSIS — M48062 Spinal stenosis, lumbar region with neurogenic claudication: Secondary | ICD-10-CM | POA: Diagnosis not present

## 2023-02-26 ENCOUNTER — Encounter: Payer: Self-pay | Admitting: Family Medicine

## 2023-03-02 ENCOUNTER — Encounter: Payer: Self-pay | Admitting: Family Medicine

## 2023-03-02 ENCOUNTER — Ambulatory Visit
Admission: RE | Admit: 2023-03-02 | Discharge: 2023-03-02 | Disposition: A | Discharge status: Home / Self Care | Payer: Medicare Other | Source: Ambulatory Visit | Attending: Family Medicine | Admitting: Family Medicine

## 2023-03-02 DIAGNOSIS — Z780 Asymptomatic menopausal state: Secondary | ICD-10-CM

## 2023-03-02 DIAGNOSIS — M8588 Other specified disorders of bone density and structure, other site: Secondary | ICD-10-CM | POA: Diagnosis not present

## 2023-03-02 DIAGNOSIS — R29890 Loss of height: Secondary | ICD-10-CM | POA: Diagnosis not present

## 2023-03-02 DIAGNOSIS — N958 Other specified menopausal and perimenopausal disorders: Secondary | ICD-10-CM | POA: Diagnosis not present

## 2023-03-22 DIAGNOSIS — M25562 Pain in left knee: Secondary | ICD-10-CM | POA: Diagnosis not present

## 2023-03-24 DIAGNOSIS — M48062 Spinal stenosis, lumbar region with neurogenic claudication: Secondary | ICD-10-CM | POA: Diagnosis not present

## 2023-03-24 DIAGNOSIS — Z6828 Body mass index (BMI) 28.0-28.9, adult: Secondary | ICD-10-CM | POA: Diagnosis not present

## 2023-03-31 DIAGNOSIS — M25562 Pain in left knee: Secondary | ICD-10-CM | POA: Diagnosis not present

## 2023-04-07 DIAGNOSIS — M25562 Pain in left knee: Secondary | ICD-10-CM | POA: Diagnosis not present

## 2023-04-13 ENCOUNTER — Encounter: Payer: Self-pay | Admitting: Family Medicine

## 2023-04-13 MED ORDER — VALACYCLOVIR HCL 1 G PO TABS: ORAL_TABLET | ORAL | 11 refills | Status: AC

## 2023-04-14 DIAGNOSIS — M25562 Pain in left knee: Secondary | ICD-10-CM | POA: Diagnosis not present

## 2023-04-21 DIAGNOSIS — M25562 Pain in left knee: Secondary | ICD-10-CM | POA: Diagnosis not present

## 2023-04-28 DIAGNOSIS — M25562 Pain in left knee: Secondary | ICD-10-CM | POA: Diagnosis not present

## 2023-04-29 ENCOUNTER — Other Ambulatory Visit: Payer: Self-pay | Admitting: Cardiology

## 2023-05-12 DIAGNOSIS — M25562 Pain in left knee: Secondary | ICD-10-CM | POA: Diagnosis not present

## 2023-05-26 DIAGNOSIS — M25562 Pain in left knee: Secondary | ICD-10-CM | POA: Diagnosis not present

## 2023-06-02 NOTE — Progress Notes (Unsigned)
 Date:  06/03/2023   ID:  Jamie Garza, DOB 1955-04-19, MRN [id number]   PCP:  Clemens Curt, MD  Cardiology:  Gaylyn Keas, MD Electrophysiologist:  None   Chief Complaint:  HTN and fm hx of CAD  History of Present Illness:    Jamie Garza is a 68 y.o. female with a hx of HTN, HLD, fm hx of CAD and hx of OSA.  She has never smoked. When I last saw her she had initially presented for evaluation due to her brother having CAD as well as AS and her mother had aortic valve problems.  She had complained of intermittent chest pressure gut thought it was related to stress.  She underwent coronary CTA a year ago and showed a coronary Ca score of 242 and mild non-obstructive CAD with 25-49% prox LAD.  Coronary FFR was normal.  She was started on statin and ASA.    She is here today for followup and is doing well.  She denies any exertional chest pain or pressure,  PND, orthopnea, dizziness, palpitations or syncope. She exercises riding a stationary bike and walks.  She has been having chest discomfort that is a pressure that she thinks is indigestion and is not exertional with no radiation.  There are no associated sx of nausea or diaphoresis. She has noticed that she is SOB going up the stairs and walking up inclines. Since she had her knee surgery she has had chronic LLE edema but recently her right ankle has been swelling intermittently but she has a desk job and sits a lot. She is compliant with her meds and is tolerating meds with no SE.      Prior CV studies:   The following studies were reviewed today:  EKG  Past Medical History:  Diagnosis Date   Arthritis    CAD (coronary artery disease), native coronary artery    coronary CTA with 25-49% prox LAD and coronary Ca score of 242 in 09/2018   Depression with anxiety 10/30/2011   Distal radius fracture, left 05/23/2011   Essential hypertension 06/14/2018   History of radius fracture 05/23/2011   Of note- nl dexa 2013-not a fragility  fracture    Hyperlipidemia 09/02/2016   Insomnia    Pulmonary hypertension (HCC)    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 10/2018   SLEEP APNEA 01/04/2007   Qualifier: Diagnosis of  By: Mildred All FNP, Gwyn Leos    Spinal stenosis, lumbar    L4 and L5 - severe; L3 and L4 - moderate   Stress reaction 07/18/2018   Tricuspid regurgitation    moderate by echo 06/2022   Wrist fracture 11/2019   right    Past Surgical History:  Procedure Laterality Date   CARPAL TUNNEL RELEASE Right 12/2018   COLONOSCOPY  08/2009   Adan Holms  tic's   TOTAL KNEE ARTHROPLASTY Left 08/04/2022   Procedure: TOTAL KNEE ARTHROPLASTY;  Surgeon: Saundra Curl, MD;  Location: [facility];  Service: Orthopedics;  Laterality: Left;   WRIST FRACTURE SURGERY  2013     Current Meds  Medication Sig   aspirin  EC 81 MG tablet Take 81 mg by mouth daily. Swallow whole.   atorvastatin  (LIPITOR) 40 MG tablet Take 1 tablet (40 mg total) by mouth daily.   betamethasone dipropionate 0.05 % lotion Apply 1 application  topically daily as needed (dry scalp).   Ca Carbonate-Mag Hydroxide (ROLAIDS EXTRA STRENGTH PO) Take 1 tablet by mouth every morning. Takes  for Calcium  supplement   Cholecalciferol (VITAMIN D3) 50 MCG (2000 UT) capsule Take 2,000 Units by mouth daily.   losartan  (COZAAR ) 100 MG tablet TAKE ONE TABLET BY MOUTH EVERY DAY   methocarbamol  (ROBAXIN ) 500 MG tablet Take 1 tablet (500 mg total) by mouth every 8 (eight) hours as needed for muscle spasms (back pain). Caution of sedation   metroNIDAZOLE (METROGEL) 0.75 % gel Apply topically. Apply topically daily Rosacea Cream   Propylene Glycol (SYSTANE COMPLETE) 0.6 % SOLN Place 1 drop into both eyes daily as needed (Dry eyes).   traZODone  (DESYREL ) 50 MG tablet TAKE ONE TABLET BY MOUTH AT BEDTIME   TURMERIC PO Take 2,000 mg by mouth daily. 1000 mg each   valACYclovir  (VALTREX ) 1000 MG tablet TAKE TWO TABLETS BY MOUTH EVERY TWELVE HOURS FOR ONE DAY FOR FEVER  BLISTER/COLD SORE     Allergies:   Patient has no known allergies.   Social History   Tobacco Use   Smoking status: Never   Smokeless tobacco: Never  Vaping Use   Vaping status: Never Used  Substance Use Topics   Alcohol use: Yes    Comment: occasional   Drug use: No     Family Hx: The patient's family history includes Aortic stenosis in her brother and mother; CAD in her brother; Diabetes in her maternal aunt, maternal grandfather, mother, and paternal grandfather. There is no history of Breast cancer, Colon cancer, Colon polyps, Esophageal cancer, Rectal cancer, or Stomach cancer.  ROS:   Please see the history of present illness.     All other systems reviewed and are negative.   Labs/Other Tests and Data Reviewed:    EKG Interpretation Date/Time:  Thursday Jun 03 2023 08:04:37 EDT Ventricular Rate:  64 PR Interval:  190 QRS Duration:  92 QT Interval:  410 QTC Calculation: 422 R Axis:   -31  Text Interpretation: Normal sinus rhythm Left axis deviation Incomplete right bundle branch block Anterior infarct , age undetermined No previous ECGs available Confirmed by Gaylyn Keas [phone]) on 06/03/2023 8:15:53 AM    Recent Labs: 10/26/2022: ALT 22; BUN 11; Creatinine, Ser 0.60; Potassium 3.6; Sodium 140; TSH 2.26 12/04/2022: Hemoglobin 11.5; Platelets 261.0   Recent Lipid Panel Lab Results  Component Value Date/Time   CHOL 135 10/26/2022 09:06 AM   CHOL 139 07/16/2021 07:51 AM   TRIG 127.0 10/26/2022 09:06 AM   HDL 54.30 10/26/2022 09:06 AM   HDL 58 07/16/2021 07:51 AM   CHOLHDL 2 10/26/2022 09:06 AM   LDLCALC 55 10/26/2022 09:06 AM   LDLCALC 66 07/16/2021 07:51 AM   LDLDIRECT 137.8 12/03/2009 08:54 AM    Wt Readings from Last 3 Encounters:  06/03/23 149 lb (67.6 kg)  10/26/22 139 lb 8 oz (63.3 kg)  08/04/22 144 lb (65.3 kg)     Objective:    Vital Signs:  BP 138/84 (BP Location: Left Arm, Patient Position: Sitting, Cuff Size: Normal)   Ht 5\' 1"  (1.549  m)   Wt 149 lb (67.6 kg)   BMI 28.15 kg/m    GEN: Well nourished, well developed in no acute distress HEENT: Normal NECK: No JVD; No carotid bruits LYMPHATICS: No lymphadenopathy CARDIAC:RRR, no murmurs, rubs, gallops RESPIRATORY:  Clear to auscultation without rales, wheezing or rhonchi  ABDOMEN: Soft, non-tender, non-distended MUSCULOSKELETAL:  No edema; No deformity  SKIN: Warm and dry NEUROLOGIC:  Alert and oriented x 3 PSYCHIATRIC:  Normal affect   ASSESSMENT & PLAN:    1.  ASCAD -nonobstructive  with 25-49% prox LAD on coronary CTA 09/2018 -EKG shows iRBBB with possible anterior infarct but unchanged from 09/2019 -2D echo 06/2022:EF 60-65%\ -she has had some atypical Chest discomfort that she thinks is related to GERD but also has noticed new SOB  -I think we should get a Stress PET CT to rule out ischemia -Informed Consent   Shared Decision Making/Informed Consent The risks [chest pain, shortness of breath, cardiac arrhythmias, dizziness, blood pressure fluctuations, myocardial infarction, stroke/transient ischemic attack, nausea, vomiting, allergic reaction, radiation exposure, metallic taste sensation and life-threatening complications (estimated to be 1 in 10,000)], benefits (risk stratification, diagnosing coronary artery disease, treatment guidance) and alternatives of a cardiac PET stress test were discussed in detail with Ms. Rosene and she agrees to proceed. -continue ASA 81mg  daily, Atorvastatin  40mg  daily with PRN refills  2.  Hypertension  -BP controlled -continue Losartan  100mg  daily with PRN refills -I have personally reviewed and interpreted outside labs serum creatinine performed by patient's PCP which showed SCr 0.6 and K+ 3.6 on 10/26/2022  3.  Hyperlipidemia  -LDL goal < 70 -I have personally reviewed and interpreted outside labs performed by patient's PCP which showed LDL 55, HDL 54 and ALT 22 on 10/26/2023 -continue Atorvastatin  40mg  daily with PRN  refills.  4.  Tricuspid regurgitation -moderate by echo 06/2022 -repeat echo 06/2023   Medication Adjustments/Labs and Tests Ordered: Current medicines are reviewed at length with the patient today.  Concerns regarding medicines are outlined above.  Tests Ordered: Orders Placed This Encounter  Procedures   EKG 12-Lead   Medication Changes: No orders of the defined types were placed in this encounter.   Disposition:  Follow up 1 year  Signed, Gaylyn Keas, MD  06/03/2023 8:26 AM    [facility]

## 2023-06-03 ENCOUNTER — Encounter: Payer: Self-pay | Admitting: Cardiology

## 2023-06-03 ENCOUNTER — Ambulatory Visit: Attending: Cardiology | Admitting: Cardiology

## 2023-06-03 VITALS — BP 138/84 | Ht 61.0 in | Wt 149.0 lb

## 2023-06-03 DIAGNOSIS — I2510 Atherosclerotic heart disease of native coronary artery without angina pectoris: Secondary | ICD-10-CM

## 2023-06-03 DIAGNOSIS — R079 Chest pain, unspecified: Secondary | ICD-10-CM

## 2023-06-03 DIAGNOSIS — E7800 Pure hypercholesterolemia, unspecified: Secondary | ICD-10-CM

## 2023-06-03 DIAGNOSIS — I10 Essential (primary) hypertension: Secondary | ICD-10-CM | POA: Diagnosis not present

## 2023-06-03 DIAGNOSIS — I071 Rheumatic tricuspid insufficiency: Secondary | ICD-10-CM

## 2023-06-03 NOTE — Addendum Note (Signed)
 Addended by: Cherylyn Cos on: 06/03/2023 08:48 AM   Modules accepted: Orders

## 2023-06-03 NOTE — Addendum Note (Signed)
 Addended by: Gaylyn Keas R on: 06/03/2023 09:08 AM   Modules accepted: Orders

## 2023-06-03 NOTE — Patient Instructions (Signed)
 Medication Instructions:  Your physician recommends that you continue on your current medications as directed. Please refer to the Current Medication list given to you today.  *If you need a refill on your cardiac medications before your next appointment, please call your pharmacy*  Lab Work: None.  If you have labs (blood work) drawn today and your tests are completely normal, you will receive your results only by: MyChart Message (if you have MyChart) OR A paper copy in the mail If you have any lab test that is abnormal or we need to change your treatment, we will call you to review the results.  Testing/Procedures: Your physician has requested that you have an echocardiogram. Echocardiography is a painless test that uses sound waves to create images of your heart. It provides your doctor with information about the size and shape of your heart and how well your heart's chambers and valves are working. This procedure takes approximately one hour. There are no restrictions for this procedure. Please do NOT wear cologne, perfume, aftershave, or lotions (deodorant is allowed). Please arrive 15 minutes prior to your appointment time.  Please note: We ask at that you not bring children with you during ultrasound (echo/ vascular) testing. Due to room size and safety concerns, children are not allowed in the ultrasound rooms during exams. Our front office staff cannot provide observation of children in our lobby area while testing is being conducted. An adult accompanying a patient to their appointment will only be allowed in the ultrasound room at the discretion of the ultrasound technician under special circumstances. We apologize for any inconvenience.     Please report to Radiology at the [facility] Main Entrance 30 minutes early for your test.  [address] [place], [place] [address]                       How to Prepare for Your Cardiac PET/CT Stress Test:  Nothing to eat  or drink, except water , 3 hours prior to arrival time.  NO caffeine/decaffeinated products, or chocolate 12 hours prior to arrival. (Please note decaffeinated beverages (teas/coffees) still contain caffeine).  If you have caffeine within 12 hours prior, the test will need to be rescheduled.  Medication instructions: Do not take erectile dysfunction medications for 72 hours prior to test (sildenafil, tadalafil) Do not take nitrates (isosorbide mononitrate, Ranexa) the day before or day of test Do not take tamsulosin the day before or morning of test Hold theophylline containing medications for 12 hours. Hold Dipyridamole 48 hours prior to the test.  You may take your remaining medications with water .  NO perfume, cologne or lotion on chest or abdomen area. FEMALES - Please avoid wearing dresses to this appointment.  Total time is 1 to 2 hours; you may want to bring reading material for the waiting time.  IF YOU THINK YOU MAY BE PREGNANT, OR ARE NURSING PLEASE INFORM THE TECHNOLOGIST.  In preparation for your appointment, medication and supplies will be purchased.  Appointment availability is limited, so if you need to cancel or reschedule, please call the Radiology Department Scheduler at [phone] 24 hours in advance to avoid a cancellation fee of $100.00  What to Expect When you Arrive:  Once you arrive and check in for your appointment, you will be taken to a preparation room within the Radiology Department.  A technologist or Nurse will obtain your medical history, verify that you are correctly prepped for the exam, and explain  the procedure.  Afterwards, an IV will be started in your arm and electrodes will be placed on your skin for EKG monitoring during the stress portion of the exam. Then you will be escorted to the PET/CT scanner.  There, staff will get you positioned on the scanner and obtain a blood pressure and EKG.  During the exam, you will continue to be connected to the EKG  and blood pressure machines.  A small, safe amount of a radioactive tracer will be injected in your IV to obtain a series of pictures of your heart along with an injection of a stress agent.    After your Exam:  It is recommended that you eat a meal and drink a caffeinated beverage to counter act any effects of the stress agent.  Drink plenty of fluids for the remainder of the day and urinate frequently for the first couple of hours after the exam.  Your doctor will inform you of your test results within 7-10 business days.  For more information and frequently asked questions, please visit our website: [email or web address]  For questions about your test or how to prepare for your test, please call: Cardiac Imaging Nurse Navigators Office: [phone]   Follow-Up: At [facility], you and your health needs are our priority.  As part of our continuing mission to provide you with exceptional heart care, our providers are all part of one team.  This team includes your primary Cardiologist (physician) and Advanced Practice Providers or APPs (Physician Assistants and Nurse Practitioners) who all work together to provide you with the care you need, when you need it.  Your next appointment:   1 year(s)  Provider:   Gaylyn Keas, MD

## 2023-06-15 ENCOUNTER — Ambulatory Visit (INDEPENDENT_AMBULATORY_CARE_PROVIDER_SITE_OTHER): Payer: Medicare Other

## 2023-06-15 VITALS — Ht 61.0 in | Wt 149.0 lb

## 2023-06-15 DIAGNOSIS — Z0000 Encounter for general adult medical examination without abnormal findings: Secondary | ICD-10-CM | POA: Diagnosis not present

## 2023-06-15 NOTE — Progress Notes (Signed)
 Please attest and cosign this visit due to patients primary care provider not being in the office at the time the visit was completed.    Subjective:   Jamie Garza is a 69 y.o. who presents for a Medicare Wellness preventive visit.  As a reminder, Annual Wellness Visits don't include a physical exam, and some assessments may be limited, especially if this visit is performed virtually. We may recommend an in-person follow-up visit with your provider if needed.  Visit Complete: Virtual I connected with  Jamie Garza on 06/15/23 by a audio enabled telemedicine application and verified that I am speaking with the correct person using two identifiers.  Patient Location: Home  Provider Location: Home Office  I discussed the limitations of evaluation and management by telemedicine. The patient expressed understanding and agreed to proceed.  Vital Signs: Because this visit was a virtual/telehealth visit, some criteria may be missing or patient reported. Any vitals not documented were not able to be obtained and vitals that have been documented are patient reported.  VideoDeclined- This patient declined Librarian, academic. Therefore the visit was completed with audio only.  Persons Participating in Visit: Patient.  AWV Questionnaire: Yes: Patient Medicare AWV questionnaire was completed by the patient on 06/14/23; I have confirmed that all information answered by patient is correct and no changes since this date.  Cardiac Risk Factors include: advanced age (>26men, >60 women);dyslipidemia;hypertension     Objective:     Today's Vitals   06/15/23 1343  Weight: 149 lb (67.6 kg)  Height: 5\' 1"  (1.549 m)   Body mass index is 28.15 kg/m.     06/15/2023    1:48 PM 08/04/2022    6:56 AM 07/22/2022   11:06 AM 06/11/2022    8:35 AM 05/27/2021   10:42 AM 05/23/2011    7:43 AM  Advanced Directives  Does Patient Have a Medical Advance Directive? Yes Yes Yes Yes Yes  Patient would not like information  Type of Advance Directive Healthcare Power of [place];Living will Healthcare Power of [place];Living will Living will;Healthcare Power of [employer] Power of [place];Living will Healthcare Power of [place];Living will   Does patient want to make changes to medical advance directive?  No - Patient declined   No - Patient declined   Copy of Healthcare Power of Attorney in Chart? Yes - validated most recent copy scanned in chart (See row information) Yes - validated most recent copy scanned in chart (See row information)  No - copy requested No - copy requested     Current Medications (verified) Outpatient Encounter Medications as of 06/15/2023  Medication Sig   aspirin  EC 81 MG tablet Take 81 mg by mouth daily. Swallow whole.   atorvastatin  (LIPITOR) 40 MG tablet Take 1 tablet (40 mg total) by mouth daily.   betamethasone dipropionate 0.05 % lotion Apply 1 application  topically daily as needed (dry scalp).   Ca Carbonate-Mag Hydroxide (ROLAIDS EXTRA STRENGTH PO) Take 1 tablet by mouth every morning. Takes for Calcium  supplement   Cholecalciferol (VITAMIN D3) 50 MCG (2000 UT) capsule Take 2,000 Units by mouth daily.   losartan  (COZAAR ) 100 MG tablet TAKE ONE TABLET BY MOUTH EVERY DAY   methocarbamol  (ROBAXIN ) 500 MG tablet Take 1 tablet (500 mg total) by mouth every 8 (eight) hours as needed for muscle spasms (back pain). Caution of sedation   metroNIDAZOLE (METROGEL) 0.75 % gel Apply topically. Apply topically daily Rosacea Cream   Propylene Glycol (SYSTANE COMPLETE)  0.6 % SOLN Place 1 drop into both eyes daily as needed (Dry eyes).   traZODone  (DESYREL ) 50 MG tablet TAKE ONE TABLET BY MOUTH AT BEDTIME   TURMERIC PO Take 2,000 mg by mouth daily. 1000 mg each   valACYclovir  (VALTREX ) 1000 MG tablet TAKE TWO TABLETS BY MOUTH EVERY TWELVE HOURS FOR ONE DAY FOR FEVER BLISTER/COLD SORE   No facility-administered encounter medications on file as of  06/15/2023.    Allergies (verified) Patient has no known allergies.   History: Past Medical History:  Diagnosis Date   Arthritis    CAD (coronary artery disease), native coronary artery    coronary CTA with 25-49% prox LAD and coronary Ca score of 242 in 09/2018   Depression with anxiety 10/30/2011   Distal radius fracture, left 05/23/2011   Essential hypertension 06/14/2018   History of radius fracture 05/23/2011   Of note- nl dexa 2013-not a fragility fracture    Hyperlipidemia 09/02/2016   Insomnia    Pulmonary hypertension (HCC)    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 10/2018   SLEEP APNEA 01/04/2007   Qualifier: Diagnosis of  By: Mildred All FNP, Gwyn Leos    Spinal stenosis, lumbar    L4 and L5 - severe; L3 and L4 - moderate   Stress reaction 07/18/2018   Tricuspid regurgitation    moderate by echo 06/2022   Wrist fracture 11/2019   right    Past Surgical History:  Procedure Laterality Date   CARPAL TUNNEL RELEASE Right 12/2018   COLONOSCOPY  08/2009   Adan Holms  tic's   TOTAL KNEE ARTHROPLASTY Left 08/04/2022   Procedure: TOTAL KNEE ARTHROPLASTY;  Surgeon: Saundra Curl, MD;  Location: [facility];  Service: Orthopedics;  Laterality: Left;   WRIST FRACTURE SURGERY  2013   Family History  Problem Relation Age of Onset   Diabetes Mother        borderline DM   Aortic stenosis Mother    Diabetes Maternal Aunt    Diabetes Maternal Grandfather    Diabetes Paternal Grandfather    CAD Brother    Aortic stenosis Brother    Breast cancer Neg Hx    Colon cancer Neg Hx    Colon polyps Neg Hx    Esophageal cancer Neg Hx    Rectal cancer Neg Hx    Stomach cancer Neg Hx    Social History   Socioeconomic History   Marital status: Married    Spouse name: Not on file   Number of children: Not on file   Years of education: Not on file   Highest education level: Some college, no degree  Occupational History   Not on file  Tobacco Use   Smoking status:  Never   Smokeless tobacco: Never  Vaping Use   Vaping status: Never Used  Substance and Sexual Activity   Alcohol use: Yes    Comment: occasional   Drug use: No   Sexual activity: Not on file  Other Topics Concern   Not on file  Social History Narrative   ** Merged History Encounter **       Social Drivers of Health   Financial Resource Strain: Low Risk  (06/14/2023)   Overall Financial Resource Strain (CARDIA)    Difficulty of Paying Living Expenses: Not hard at all  Food Insecurity: No Food Insecurity (06/14/2023)   Hunger Vital Sign    Worried About Running Out of Food in the Last Year: Never true  Ran Out of Food in the Last Year: Never true  Transportation Needs: No Transportation Needs (06/14/2023)   PRAPARE - Administrator, Civil Service (Medical): No    Lack of Transportation (Non-Medical): No  Physical Activity: Sufficiently Active (06/14/2023)   Exercise Vital Sign    Days of Exercise per Week: 5 days    Minutes of Exercise per Session: 30 min  Stress: No Stress Concern Present (06/14/2023)   [employer] of Occupational Health - Occupational Stress Questionnaire    Feeling of Stress : Not at all  Social Connections: Socially Integrated (06/14/2023)   Social Connection and Isolation Panel [NHANES]    Frequency of Communication with Friends and Family: More than three times a week    Frequency of Social Gatherings with Friends and Family: Twice a week    Attends Religious Services: More than 4 times per year    Active Member of [employer] or Organizations: Yes    Attends Engineer, structural: More than 4 times per year    Marital Status: Married    Tobacco Counseling Counseling given: Not Answered    Clinical Intake:  Pre-visit preparation completed: Yes  Pain : No/denies pain     BMI - recorded: 28.15 Nutritional Status: BMI 25 -29 Overweight Nutritional Risks: None Diabetes: No  No results found for: "HGBA1C"   How often do  you need to have someone help you when you read instructions, pamphlets, or other written materials from your doctor or pharmacy?: 1 - Never  Interpreter Needed?: No  Comments: lives with husband Information entered by :: B.Arora Coakley,LPN   Activities of Daily Living     06/14/2023    1:22 PM 07/22/2022   11:09 AM  In your present state of health, do you have any difficulty performing the following activities:  Hearing? 0   Vision? 0   Difficulty concentrating or making decisions? 0   Walking or climbing stairs? 0   Dressing or bathing? 0   Doing errands, shopping? 0 0  Preparing Food and eating ? N   Using the Toilet? N   In the past six months, have you accidently leaked urine? N   Do you have problems with loss of bowel control? N   Managing your Medications? N   Managing your Finances? N   Housekeeping or managing your Housekeeping? N     Patient Care Team: Tower, Manley Seeds, MD as PCP - General Jacqueline Matsu, MD as PCP - Cardiology (Cardiology) Horace Lye, OD (Optometry)  Indicate any recent Medical Services you may have received from other than [facility] providers in the past year (date may be approximate).     Assessment:    This is a routine wellness examination for Jamie Garza.  Hearing/Vision screen Hearing Screening - Comments:: Pt says her hearing is good Vision Screening - Comments:: Pt says her vision is good w/glasses and contacts [facility]   Goals Addressed   None    Depression Screen     06/15/2023    1:46 PM 10/26/2022    8:32 AM 06/11/2022    8:35 AM 05/20/2022    4:07 PM 05/27/2021   10:37 AM 10/21/2020    8:35 AM 09/11/2019    9:51 AM  PHQ 2/9 Scores  PHQ - 2 Score 0 0 0 0 0 0 0  PHQ- 9 Score  0 0 1   0    Fall Risk     06/14/2023    1:22  PM 10/26/2022    8:32 AM 06/11/2022    8:36 AM 05/20/2022    4:06 PM 05/27/2021   10:40 AM  Fall Risk   Falls in the past year? 0 0 0 0 0  Number falls in past yr: 0 0 0 0 0  Injury with Fall? 0 0 0 0 0  Risk for fall  due to : No Fall Risks No Fall Risks No Fall Risks No Fall Risks No Fall Risks  Follow up Education provided;Falls prevention discussed Falls evaluation completed Falls prevention discussed;Falls evaluation completed Falls evaluation completed     MEDICARE RISK AT HOME:  Medicare Risk at Home Any stairs in or around the home?: (Patient-Rptd) Yes If so, are there any without handrails?: (Patient-Rptd) No Home free of loose throw rugs in walkways, pet beds, electrical cords, etc?: (Patient-Rptd) Yes Adequate lighting in your home to reduce risk of falls?: (Patient-Rptd) Yes Life alert?: (Patient-Rptd) No Use of a cane, walker or w/c?: (Patient-Rptd) No Grab bars in the bathroom?: (Patient-Rptd) Yes Shower chair or bench in shower?: (Patient-Rptd) Yes Elevated toilet seat or a handicapped toilet?: (Patient-Rptd) Yes  TIMED UP AND GO:  Was the test performed?  No  Cognitive Function: 6CIT completed        06/15/2023    1:49 PM 06/11/2022    8:37 AM 05/27/2021   10:43 AM  6CIT Screen  What Year? 0 points 0 points 0 points  What month? 0 points 0 points 0 points  What time? 0 points 0 points 0 points  Count back from 20 0 points 0 points 0 points  Months in reverse 0 points 0 points 0 points  Repeat phrase 0 points 0 points 0 points  Total Score 0 points 0 points 0 points    Immunizations Immunization History  Administered Date(s) Administered   Fluad Quad(high Dose 65+) 10/21/2020, 10/23/2021   Influenza Whole 11/20/2008   Influenza, High Dose Seasonal PF 10/15/2022   Influenza,inj,Quad PF,6+ Mos 10/21/2015, 10/22/2016, 11/01/2018   Influenza-Unspecified 10/20/2013, 09/19/2017   PFIZER(Purple Top)SARS-COV-2 Vaccination 03/14/2019, 04/06/2019   PNEUMOCOCCAL CONJUGATE-20 10/21/2020   Pfizer Covid-19 Vaccine Bivalent Booster 70yrs & up 08/09/2020   Pfizer(Comirnaty)Fall Seasonal Vaccine 12 years and older 10/26/2021, 10/12/2022   Td 11/20/2008   Tdap 11/01/2018   Zoster  Recombinant(Shingrix) 08/30/2018, 12/20/2018   Zoster, Live 10/28/2015    Screening Tests Health Maintenance  Topic Date Due   Hepatitis C Screening  Never done   COVID-19 Vaccine (6 - 2024-25 season) 04/11/2023   INFLUENZA VACCINE  08/20/2023   MAMMOGRAM  11/12/2023   Medicare Annual Wellness (AWV)  06/14/2024   DTaP/Tdap/Td (3 - Td or Tdap) 10/31/2028   Colonoscopy  06/27/2030   Pneumonia Vaccine 69+ Years old  Completed   DEXA SCAN  Completed   Zoster Vaccines- Shingrix  Completed   HPV VACCINES  Aged Out   Meningococcal B Vaccine  Aged Out    Health Maintenance  Health Maintenance Due  Topic Date Due   Hepatitis C Screening  Never done   COVID-19 Vaccine (6 - 2024-25 season) 04/11/2023   Health Maintenance Items Addressed: None needed at this time  Additional Screening:  Vision Screening: Recommended annual ophthalmology exams for early detection of glaucoma and other disorders of the eye.  Dental Screening: Recommended annual dental exams for proper oral hygiene  Community Resource Referral / Chronic Care Management: CRR required this visit?  No   CCM required this visit?  No   Plan:  I have personally reviewed and noted the following in the patient's chart:   Medical and social history Use of alcohol, tobacco or illicit drugs  Current medications and supplements including opioid prescriptions. Patient is not currently taking opioid prescriptions. Functional ability and status Nutritional status Physical activity Advanced directives List of other physicians Hospitalizations, surgeries, and ER visits in previous 12 months Vitals Screenings to include cognitive, depression, and falls Referrals and appointments  In addition, I have reviewed and discussed with patient certain preventive protocols, quality metrics, and best practice recommendations. A written personalized care plan for preventive services as well as general preventive health  recommendations were provided to patient.   Jamie Bannister, LPN   [id number]   After Visit Summary: (MyChart) Due to this being a telephonic visit, the after visit summary with patients personalized plan was offered to patient via MyChart   Notes: Nothing significant to report at this time.

## 2023-06-15 NOTE — Patient Instructions (Signed)
 Ms. Jamie Garza , Thank you for taking time out of your busy schedule to complete your Annual Wellness Visit with me. I enjoyed our conversation and look forward to speaking with you again next year. I, as well as your care team,  appreciate your ongoing commitment to your health goals. Please review the following plan we discussed and let me know if I can assist you in the future. Your Game plan/ To Do List     Follow up Visits: Next Medicare AWV with our clinical staff: 06/15/24 @1 :40pm televisit   Have you seen your provider in the last 6 months (3 months if uncontrolled diabetes)? No Next Office Visit with your provider: 10/28/23  Clinician Recommendations:  Aim for 30 minutes of exercise or brisk walking, 6-8 glasses of water , and 5 servings of fruits and vegetables each day.       This is a list of the screening recommended for you and due dates:  Health Maintenance  Topic Date Due   Hepatitis C Screening  Never done   COVID-19 Vaccine (6 - 2024-25 season) 04/11/2023   Flu Shot  08/20/2023   Mammogram  11/12/2023   Medicare Annual Wellness Visit  06/14/2024   DTaP/Tdap/Td vaccine (3 - Td or Tdap) 10/31/2028   Colon Cancer Screening  06/27/2030   Pneumonia Vaccine  Completed   DEXA scan (bone density measurement)  Completed   Zoster (Shingles) Vaccine  Completed   HPV Vaccine  Aged Out   Meningitis B Vaccine  Aged Out    Advanced directives: (In Chart) A copy of your advanced directives are scanned into your chart should your provider ever need it. Advance Care Planning is important because it:  [x]  Makes sure you receive the medical care that is consistent with your values, goals, and preferences  [x]  It provides guidance to your family and loved ones and reduces their decisional burden about whether or not they are making the right decisions based on your wishes.  Follow the link provided in your after visit summary or read over the paperwork we have mailed to you to help you  started getting your Advance Directives in place. If you need assistance in completing these, please reach out to us  so that we can help you!

## 2023-06-29 ENCOUNTER — Encounter: Payer: Self-pay | Admitting: Cardiology

## 2023-06-29 NOTE — Telephone Encounter (Signed)
**Note De-identified  [place] Obfuscation** Please advise 

## 2023-07-12 ENCOUNTER — Ambulatory Visit: Payer: Self-pay | Admitting: Cardiology

## 2023-07-12 ENCOUNTER — Encounter: Payer: Self-pay | Admitting: Cardiology

## 2023-07-12 ENCOUNTER — Ambulatory Visit (HOSPITAL_COMMUNITY)
Admission: RE | Admit: 2023-07-12 | Discharge: 2023-07-12 | Disposition: A | Discharge status: Home / Self Care | Source: Ambulatory Visit | Attending: Cardiology | Admitting: Cardiology

## 2023-07-12 DIAGNOSIS — I071 Rheumatic tricuspid insufficiency: Secondary | ICD-10-CM

## 2023-07-12 DIAGNOSIS — I272 Pulmonary hypertension, unspecified: Secondary | ICD-10-CM

## 2023-07-12 DIAGNOSIS — Z79899 Other long term (current) drug therapy: Secondary | ICD-10-CM

## 2023-07-12 DIAGNOSIS — I5189 Other ill-defined heart diseases: Secondary | ICD-10-CM | POA: Insufficient documentation

## 2023-07-12 LAB — ECHOCARDIOGRAM COMPLETE
Area-P 1/2: 3.08 cm2
S' Lateral: 2.6 cm

## 2023-07-15 NOTE — Telephone Encounter (Signed)
-----   Message from Wilbert Bihari sent at 07/12/2023  7:05 PM EDT ----- Repeat echo in 1 year for PHTN.  I would like her to have a VQ scan to rule out chronic PEs, PFTs with DLCO and ESR/CRP for PHTN>>her PHTN is likely related to OSA but need to rule out chronic PEs,  COPD, and autoimmune disease ----- Message ----- From: Interface, [employer] Sent: 07/12/2023   4:40 PM EDT To: Wilbert JONELLE Bihari, MD

## 2023-07-15 NOTE — Telephone Encounter (Signed)
 Call to patient to discuss Dr. Dorine recommendation for:   Repeat echo in 1 year for PHTN.  I would like her to have a VQ scan to rule out chronic PEs,  PFTs with DLCO and ESR/CRP for PHTN>>her PHTN is likely related to OSA but need to rule out chronic PEs,  COPD, and autoimmune disease   Patient verbalizes understanding, orders placed.

## 2023-07-20 DIAGNOSIS — Z79899 Other long term (current) drug therapy: Secondary | ICD-10-CM | POA: Diagnosis not present

## 2023-07-20 DIAGNOSIS — I272 Pulmonary hypertension, unspecified: Secondary | ICD-10-CM | POA: Diagnosis not present

## 2023-07-21 LAB — C-REACTIVE PROTEIN: CRP: <1 mg/L (ref 0–10)

## 2023-07-21 LAB — SEDIMENTATION RATE: Sed Rate: 5 mm/h (ref 0–40)

## 2023-07-27 ENCOUNTER — Encounter (HOSPITAL_COMMUNITY): Payer: Self-pay

## 2023-07-28 ENCOUNTER — Encounter (HOSPITAL_COMMUNITY)
Admission: RE | Admit: 2023-07-28 | Discharge: 2023-07-28 | Disposition: A | Discharge status: Home / Self Care | Source: Ambulatory Visit | Attending: Cardiology | Admitting: Cardiology

## 2023-07-28 ENCOUNTER — Encounter: Payer: Self-pay | Admitting: Cardiology

## 2023-07-28 DIAGNOSIS — R079 Chest pain, unspecified: Secondary | ICD-10-CM | POA: Insufficient documentation

## 2023-07-28 MED ORDER — RUBIDIUM RB82 GENERATOR (RUBYFILL)
17.5000 | PACK | Freq: Once | INTRAVENOUS | Status: AC
Start: 2023-07-28 — End: 2023-07-28
  Administered 2023-07-28: 17.55 via INTRAVENOUS

## 2023-07-28 MED ORDER — REGADENOSON 0.4 MG/5ML IV SOLN
0.4000 mg | Freq: Once | INTRAVENOUS | Status: AC
  Administered 2023-07-28: 0.4 mg via INTRAVENOUS

## 2023-07-28 MED ORDER — RUBIDIUM RB82 GENERATOR (RUBYFILL)
17.4400 | PACK | Freq: Once | INTRAVENOUS | Status: AC
  Administered 2023-07-28: 17.44 via INTRAVENOUS

## 2023-07-28 MED ORDER — REGADENOSON 0.4 MG/5ML IV SOLN
INTRAVENOUS | Status: AC
  Filled 2023-07-28: qty 5

## 2023-07-29 LAB — NM PET CT CARDIAC PERFUSION MULTI W/ABSOLUTE BLOODFLOW
MBFR: 2.97
Nuc Rest EF: 61 %
Nuc Stress EF: 72 %
Rest MBF: 1.01 ml/g/min
Rest Nuclear Isotope Dose: 17.4 mCi
ST Depression (mm): 0 mm
Stress MBF: 3 ml/g/min
Stress Nuclear Isotope Dose: 17.6 mCi
TID: 0.98

## 2023-07-29 NOTE — Telephone Encounter (Signed)
 Call to patient to advise that stress test was fine. Also advised that noncardiac portion of stress PET CT showed scaring in the left lung with stable 5mm pulmonary nodule in the RML felt to be benign, which patient states her PCP has been following for her since at least 2020. Patient verbalizes understanding of finding of aortic atherosclerosis. Results forwarded to PCP.

## 2023-07-29 NOTE — Telephone Encounter (Signed)
-----   Message from Wilbert Bihari sent at 07/28/2023  3:36 PM EDT ----- Noncardiac portion of stress PET CT showed scaring in the left lung with stable 5mm pulmonary nodule in the RML felt to be benign and aortic atherosclerosis> forward to PCP ----- Message ----- From: Interface, Rad Results In Sent: 07/28/2023   1:57 PM EDT To: Wilbert JONELLE Bihari, MD

## 2023-07-31 ENCOUNTER — Other Ambulatory Visit: Payer: Self-pay | Admitting: Cardiology

## 2023-08-02 ENCOUNTER — Encounter (HOSPITAL_COMMUNITY)
Admission: RE | Admit: 2023-08-02 | Discharge: 2023-08-02 | Disposition: A | Discharge status: Home / Self Care | Source: Ambulatory Visit | Attending: Cardiology | Admitting: Cardiology

## 2023-08-02 ENCOUNTER — Ambulatory Visit (HOSPITAL_COMMUNITY)
Admission: RE | Admit: 2023-08-02 | Discharge: 2023-08-02 | Disposition: A | Discharge status: Home / Self Care | Source: Ambulatory Visit | Attending: Cardiology | Admitting: Cardiology

## 2023-08-02 DIAGNOSIS — I272 Pulmonary hypertension, unspecified: Secondary | ICD-10-CM | POA: Diagnosis not present

## 2023-08-02 LAB — PULMONARY FUNCTION TEST
DL/VA % pred: 90 %
DL/VA: 3.86 ml/min/mmHg/L
DLCO unc % pred: 84 %
DLCO unc: 14.99 ml/min/mmHg
FEF 25-75 Pre: 1.89 L/s
FEF2575-%Pred-Pre: 103 %
FEV1-%Pred-Pre: 90 %
FEV1-Pre: 1.85 L
FEV1FVC-%Pred-Pre: 103 %
FEV6-%Pred-Pre: 88 %
FEV6-Pre: 2.3 L
FEV6FVC-%Pred-Pre: 104 %
FVC-%Pred-Pre: 86 %
FVC-Pre: 2.34 L
Pre FEV1/FVC ratio: 79 %
Pre FEV6/FVC Ratio: 100 %

## 2023-08-02 MED ORDER — TECHNETIUM TO 99M ALBUMIN AGGREGATED
4.4000 | Freq: Once | INTRAVENOUS | Status: AC | PRN
  Administered 2023-08-02: 4.4 via INTRAVENOUS

## 2023-08-03 ENCOUNTER — Ambulatory Visit: Payer: Self-pay | Admitting: Cardiology

## 2023-09-13 ENCOUNTER — Ambulatory Visit
Admission: EM | Admit: 2023-09-13 | Discharge: 2023-09-13 | Disposition: A | Discharge status: Home / Self Care | Attending: Nurse Practitioner | Admitting: Nurse Practitioner

## 2023-09-13 DIAGNOSIS — J019 Acute sinusitis, unspecified: Secondary | ICD-10-CM | POA: Diagnosis not present

## 2023-09-13 DIAGNOSIS — R051 Acute cough: Secondary | ICD-10-CM

## 2023-09-13 DIAGNOSIS — I10 Essential (primary) hypertension: Secondary | ICD-10-CM | POA: Diagnosis not present

## 2023-09-13 LAB — POC SARS CORONAVIRUS 2 AG -  ED: SARS Coronavirus 2 Ag: NEGATIVE

## 2023-09-13 MED ORDER — METHYLPREDNISOLONE 4 MG PO TBPK: ORAL_TABLET | ORAL | 0 refills | Status: DC

## 2023-09-13 MED ORDER — AMOXICILLIN 875 MG PO TABS: 875.0000 mg | ORAL_TABLET | Freq: Two times a day (BID) | ORAL | 0 refills | Status: AC

## 2023-09-13 MED ORDER — CETIRIZINE-PSEUDOEPHEDRINE ER 5-120 MG PO TB12: 1.0000 | ORAL_TABLET | Freq: Every day | ORAL | 0 refills | Status: AC

## 2023-09-13 MED ORDER — FLUTICASONE PROPIONATE 50 MCG/ACT NA SUSP: 2.0000 | Freq: Every day | NASAL | 0 refills | Status: AC

## 2023-09-13 MED ORDER — BENZONATATE 200 MG PO CAPS: 200.0000 mg | ORAL_CAPSULE | Freq: Three times a day (TID) | ORAL | 0 refills | Status: DC | PRN

## 2023-09-13 NOTE — Discharge Instructions (Signed)
 Your symptoms are consistent with a sinus infection that has lasted about a week. A COVID test today was negative. You have been prescribed a course of antibiotics, steroids, Flonase  nasal spray, Zyrtec -D to address your sinus drainage and congestion, and benzonatate  for cough. Take all medications exactly as directed. Use the nasal spray daily, take Zyrtec -D for congestion, and use benzonatate  for cough relief. Prednisone should be taken in the morning with food. Continue supportive measures such as resting, drinking plenty of fluids, using a humidifier, and applying warm compresses over your sinuses to help relieve pressure.  Your blood pressure was elevated during today's visit. There were no signs of a dangerous emergency, but it is important to take your blood pressure medication every day without missing doses. Resume your usual medication schedule now that you are home from travel. Monitor your blood pressure at home if possible.  Follow up with your primary care provider within a week if your sinus symptoms do not improve or sooner if you continue to have high blood pressure readings or medication concerns. Go to the emergency department right away if you develop severe shortness of breath, chest pain, confusion, vision changes, swelling of your face or throat, or if your blood pressure is very high with concerning symptoms

## 2023-09-13 NOTE — ED Provider Notes (Signed)
 [name]    CSN: [id number] Arrival date & time: 09/13/23  9182      History   Chief Complaint Chief Complaint  Patient presents with   Cough    HPI Jamie Garza is a 68 y.o. female.   Discussed the use of AI scribe software for clinical note transcription with the patient, who gave verbal consent to proceed.   Patient with a history of hypertension and hypercholesterolemia presents with symptoms of sinus infection that began during a recent vacation to [place] and a cruise. The patient reports symptoms starting mildly on last Tuesday, worsening by Wednesday, and becoming severe by Thursday through Saturday.  The patient's chief complaints include headache, sinus pressure, pain behind the eyes, bilateral ear popping (worse in the left ear), sore throat from coughing and postnasal drainage, nasal discharge, some body aches, mild sneezing, nasal congestion, and reduced appetite. Yesterday, while flying home from [place], the patient experienced a severe headache.  The patient reports taking Oscillococcinum and probiotics as well as Robitussin last week but discontinued them due to lack of efficacy. She is primarily taking Tylenol  for symptom management. The patient wore a mask during excursions to mitigate exposure. She denies shortness of breath, wheezing, vomiting, or diarrhea.  The following portions of the patient's history were reviewed and updated as appropriate: allergies, current medications, past family history, past medical history, past social history, past surgical history, and problem list.      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   SLEEP APNEA 01/04/2007   Qualifier: Diagnosis of  By: Baird FNP, Frieda Kipper    Spinal stenosis, lumbar    L4 and L5 - severe; L3 and L4 - moderate   Stress reaction 07/18/2018   Tricuspid regurgitation    moderate by echo 06/2022   Wrist fracture 11/2019   right     Patient Active Problem List   Diagnosis Date Noted   Grade II diastolic dysfunction    Anemia 10/26/2022   Tricuspid regurgitation 07/06/2022   Aortic atherosclerosis (HCC) 05/20/2022   Osteopenia 10/23/2021   Post-nasal drip 09/04/2021   Estrogen deficiency 10/21/2020   Tinnitus 04/13/2020   CAD (coronary artery disease), native coronary artery    Pulmonary nodule 06/01/2019   H/O cold sores 01/04/2019   Family history of heart disease 08/05/2018   Essential hypertension 06/14/2018   Spinal stenosis 09/02/2016   Hyperlipidemia 09/02/2016   Routine general medical examination at a health care facility 12/15/2010   Insomnia 11/20/2008   Sleep apnea 01/04/2007    Past Surgical History:  Procedure Laterality Date   CARPAL TUNNEL RELEASE Right 12/2018   COLONOSCOPY  08/2009   Jakie  tic's   TOTAL KNEE ARTHROPLASTY Left 08/04/2022   Procedure: TOTAL KNEE ARTHROPLASTY;  Surgeon: Beverley Evalene BIRCH, MD;  Location: [facility];  Service: Orthopedics;  Laterality: Left;   WRIST FRACTURE SURGERY  2013    OB History   No obstetric history on file.      Home Medications    Prior to Admission medications   Medication Sig Start Date End Date Taking? Authorizing Provider  amoxicillin  (AMOXIL ) 875 MG tablet Take 1 tablet (875 mg total) by mouth 2 (two) times daily for 7 days. 09/13/23 09/20/23 Yes Cana Mignano, FNP  benzonatate  (TESSALON ) 200 MG capsule Take 1 capsule (200 mg total) by  mouth 3 (three) times daily as needed for cough. 09/13/23  Yes Donicia Druck, FNP  cetirizine -pseudoephedrine  (ZYRTEC -D) 5-120 MG tablet Take 1 tablet by mouth daily with breakfast for 10 days. 09/13/23 09/23/23 Yes Iola Lukes, FNP  fluticasone  (FLONASE ) 50 MCG/ACT nasal spray Place 2 sprays into both nostrils daily. Shake well before use. Gently blow nose before spraying. Do not blow nose immediately after use. You should not taste the medication or feel it going down your throat; if you do, adjust your technique. 09/13/23  Yes Iola Lukes, FNP  methylPREDNISolone  (MEDROL  DOSEPAK) 4 MG TBPK tablet Take as directed 09/13/23  Yes Ronelle Michie, Lukes, FNP  aspirin  EC 81 MG tablet Take 81 mg by mouth daily. Swallow whole.    [provider]  atorvastatin  (LIPITOR) 40 MG tablet Take 1 tablet (40 mg total) by mouth daily. 04/29/23   Shlomo Wilbert SAUNDERS, MD  betamethasone dipropionate 0.05 % lotion Apply 1 application  topically daily as needed (dry scalp). 06/03/21   [provider]  Ca Carbonate-Mag Hydroxide (ROLAIDS EXTRA STRENGTH PO) Take 1 tablet by mouth every morning. Takes for Calcium  supplement    [provider]  Cholecalciferol (VITAMIN D3) 50 MCG (2000 UT) capsule Take 2,000 Units by mouth daily.    [provider]  losartan  (COZAAR ) 100 MG tablet TAKE ONE TABLET BY MOUTH EVERY DAY 08/02/23   Shlomo Wilbert SAUNDERS, MD  methocarbamol  (ROBAXIN ) 500 MG tablet Take 1 tablet (500 mg total) by mouth every 8 (eight) hours as needed for muscle spasms (back pain). Caution of sedation 10/26/22   Tower, Laine LABOR, MD  metroNIDAZOLE (METROGEL) 0.75 % gel Apply topically. Apply topically daily Rosacea Cream    [provider]  Propylene Glycol (SYSTANE COMPLETE) 0.6 % SOLN Place 1 drop into both eyes daily as needed (Dry eyes).    [provider]  traZODone  (DESYREL ) 50 MG tablet TAKE ONE TABLET BY MOUTH AT BEDTIME 02/01/23   Tower, Laine LABOR, MD  TURMERIC PO Take  2,000 mg by mouth daily. 1000 mg each    [provider]  valACYclovir  (VALTREX ) 1000 MG tablet TAKE TWO TABLETS BY MOUTH EVERY TWELVE HOURS FOR ONE DAY FOR FEVER BLISTER/COLD SORE 04/13/23   Tower, Laine LABOR, MD    Family History Family History  Problem Relation Age of Onset   Diabetes Mother        borderline DM   Aortic stenosis Mother    Diabetes Maternal Aunt    Diabetes Maternal Grandfather    Diabetes Paternal Grandfather    CAD Brother    Aortic stenosis Brother    Breast cancer Neg Hx    Colon cancer Neg Hx    Colon polyps Neg Hx    Esophageal cancer Neg Hx    Rectal cancer Neg Hx    Stomach cancer Neg Hx     Social History Social History   Tobacco Use   Smoking status: Never   Smokeless tobacco: Never  Vaping Use   Vaping status: Never Used  Substance  Use Topics   Alcohol use: Yes    Comment: occasional   Drug use: No     Allergies   Patient has no known allergies.   Review of Systems Review of Systems  Constitutional:  Positive for appetite change. Negative for chills, fatigue and fever.  HENT:  Positive for congestion, ear pain (bilateral ear popping, L>R), postnasal drip, rhinorrhea, sneezing and sore throat. Negative for trouble swallowing.   Eyes:  Positive for discharge (matting closed in the morning). Negative for photophobia, pain, redness, itching and visual disturbance.  Respiratory:  Positive for cough (mostly dry). Negative for shortness of breath and wheezing.   Gastrointestinal:  Negative for diarrhea, nausea and vomiting.  Neurological:  Positive for headaches.  All other systems reviewed and are negative.    Physical Exam Triage Vital Signs ED Triage Vitals  Encounter Vitals Group     BP 09/13/23 0822 (!) 175/80     Girls Systolic BP Percentile --      Girls Diastolic BP Percentile --      Boys Systolic BP Percentile --      Boys Diastolic BP Percentile --      Pulse Rate 09/13/23 0822 93     Resp 09/13/23 0822 18      Temp 09/13/23 0822 98.4 F (36.9 C)     Temp Source 09/13/23 0822 Oral     SpO2 09/13/23 0822 96 %     Weight --      Height --      Head Circumference --      Peak Flow --      Pain Score 09/13/23 0824 0     Pain Loc --      Pain Education --      Exclude from Growth Chart --    No data found.  Updated Vital Signs BP (!) 153/79 (BP Location: Right Arm)   Pulse 93   Temp 98.4 F (36.9 C) (Oral)   Resp 18   SpO2 96%   Visual Acuity Right Eye Distance:   Left Eye Distance:   Bilateral Distance:    Right Eye Near:   Left Eye Near:    Bilateral Near:     Physical Exam Vitals reviewed.  Constitutional:      General: She is awake. She is not in acute distress.    Appearance: Normal appearance. She is well-developed and well-groomed. She is not ill-appearing, toxic-appearing or diaphoretic.  HENT:     Head: Normocephalic.     Right Ear: Hearing, tympanic membrane, ear canal and external ear normal. No drainage, swelling or tenderness. No middle ear effusion. Tympanic membrane is not erythematous.     Left Ear: Hearing, tympanic membrane, ear canal and external ear normal. No drainage, swelling or tenderness.  No middle ear effusion. Tympanic membrane is not erythematous.     Nose: Congestion present. No rhinorrhea.     Right Sinus: No maxillary sinus tenderness or frontal sinus tenderness.     Left Sinus: No maxillary sinus tenderness or frontal sinus tenderness.     Mouth/Throat:     Lips: Pink.     Mouth: Mucous membranes are moist.     Pharynx: Oropharynx is clear. Uvula midline. No pharyngeal swelling, oropharyngeal exudate, posterior oropharyngeal erythema or uvula swelling.     Tonsils: No tonsillar exudate or tonsillar abscesses.  Eyes:     General: Vision grossly intact.        Right eye: No discharge.  Left eye: No discharge.     Conjunctiva/sclera: Conjunctivae normal.  Cardiovascular:     Rate and Rhythm: Normal rate and regular rhythm.     Heart  sounds: Normal heart sounds.  Pulmonary:     Effort: Pulmonary effort is normal.     Breath sounds: Normal breath sounds and air entry.  Musculoskeletal:        General: Normal range of motion.     Cervical back: Full passive range of motion without pain, normal range of motion and neck supple.  Lymphadenopathy:     Cervical: No cervical adenopathy.  Skin:    General: Skin is warm and dry.  Neurological:     General: No focal deficit present.     Mental Status: She is alert and oriented to person, place, and time.  Psychiatric:        Mood and Affect: Mood normal.        Behavior: Behavior normal. Behavior is cooperative.      UC Treatments / Results  Labs (all labs ordered are listed, but only abnormal results are displayed) Labs Reviewed  POC SARS CORONAVIRUS 2 AG -  ED    EKG   Radiology No results found.  Procedures Procedures (including critical care time)  Medications Ordered in UC Medications - No data to display  Initial Impression / Assessment and Plan / UC Course  I have reviewed the triage vital signs and the nursing notes.  Pertinent labs & imaging results that were available during my care of the patient were reviewed by me and considered in my medical decision making (see chart for details).     Patient presents with one week of headache, sinus pressure, pain behind the eyes, ear popping, sore throat related to cough and postnasal drainage, nasal discharge, body aches, sneezing, and congestion. She reports decreased appetite but denies shortness of breath, wheezing, vomiting, or diarrhea. Prior self-treatment with Oscillococcinum, probiotics, and Robitussin was ineffective. Given persistence of symptoms for more than a week, bacterial sinusitis is suspected. COVID-19 testing was negative. Treatment initiated with amoxicillin , Medrol  dose pack, Flonase , Zyrtec -D, and benzonatate  in addition to supportive measures.  Blood pressure was noted to be elevated  during the visit without signs of hypertensive urgency or emergency. Patient has a known history of hypertension and reports taking her medication today, though she admits to missing a few doses while traveling. She was counseled to resume consistent adherence with her prescribed regimen.  Patient was advised to follow up with her PCP if no improvement in sinus symptoms within 5-7 days and to seek emergency care if symptoms worsen, new respiratory distress develops, or blood pressure readings become significantly elevated with associated symptoms.  Today's evaluation has revealed no signs of a dangerous process. Discussed diagnosis with patient and/or guardian. Patient and/or guardian aware of their diagnosis, possible red flag symptoms to watch out for and need for close follow up. Patient and/or guardian understands verbal and written discharge instructions. Patient and/or guardian comfortable with plan and disposition.  Patient and/or guardian has a clear mental status at this time, good insight into illness (after discussion and teaching) and has clear judgment to make decisions regarding their care  Documentation was completed with the aid of voice recognition software. Transcription may contain typographical errors. Final Clinical Impressions(s) / UC Diagnoses   Final diagnoses:  Acute cough  Acute non-recurrent sinusitis, unspecified location  Elevated blood pressure reading with diagnosis of hypertension     Discharge Instructions  Your symptoms are consistent with a sinus infection that has lasted about a week. A COVID test today was negative. You have been prescribed a course of antibiotics, steroids, Flonase  nasal spray, Zyrtec -D to address your sinus drainage and congestion, and benzonatate  for cough. Take all medications exactly as directed. Use the nasal spray daily, take Zyrtec -D for congestion, and use benzonatate  for cough relief. Prednisone should be taken in the morning with  food. Continue supportive measures such as resting, drinking plenty of fluids, using a humidifier, and applying warm compresses over your sinuses to help relieve pressure.  Your blood pressure was elevated during today's visit. There were no signs of a dangerous emergency, but it is important to take your blood pressure medication every day without missing doses. Resume your usual medication schedule now that you are home from travel. Monitor your blood pressure at home if possible.  Follow up with your primary care provider within a week if your sinus symptoms do not improve or sooner if you continue to have high blood pressure readings or medication concerns. Go to the emergency department right away if you develop severe shortness of breath, chest pain, confusion, vision changes, swelling of your face or throat, or if your blood pressure is very high with concerning symptoms     ED Prescriptions     Medication Sig Dispense Auth. Provider   amoxicillin  (AMOXIL ) 875 MG tablet Take 1 tablet (875 mg total) by mouth 2 (two) times daily for 7 days. 14 tablet Najir Roop, [place], FNP   methylPREDNISolone  (MEDROL  DOSEPAK) 4 MG TBPK tablet Take as directed 21 tablet Iola Lukes, FNP   fluticasone  (FLONASE ) 50 MCG/ACT nasal spray Place 2 sprays into both nostrils daily. Shake well before use. Gently blow nose before spraying. Do not blow nose immediately after use. You should not taste the medication or feel it going down your throat; if you do, adjust your technique. 16 g Iola Lukes, FNP   cetirizine -pseudoephedrine  (ZYRTEC -D) 5-120 MG tablet Take 1 tablet by mouth daily with breakfast for 10 days. 10 tablet Iola Lukes, FNP   benzonatate  (TESSALON ) 200 MG capsule Take 1 capsule (200 mg total) by mouth 3 (three) times daily as needed for cough. 30 capsule Iola Lukes, FNP      PDMP not reviewed this encounter.   Iola [place], [place] 09/13/23 [phone]

## 2023-09-13 NOTE — ED Triage Notes (Signed)
 Pt c/o cough x 6 days. Also c/o nasal congestion/HA and ear pain/pressure, worse in LT. No known fever. Taking tylenol  prn.

## 2023-09-14 ENCOUNTER — Telehealth: Payer: Self-pay

## 2023-09-14 NOTE — Telephone Encounter (Signed)
 Called to check on patient, pt doing better. No needs at this time.

## 2023-09-28 ENCOUNTER — Encounter: Payer: Self-pay | Admitting: Family Medicine

## 2023-09-28 NOTE — Telephone Encounter (Signed)
 If she does not continue to improve-follow up for in person eval Thanks

## 2023-09-29 DIAGNOSIS — M4310 Spondylolisthesis, site unspecified: Secondary | ICD-10-CM | POA: Diagnosis not present

## 2023-09-29 DIAGNOSIS — M48062 Spinal stenosis, lumbar region with neurogenic claudication: Secondary | ICD-10-CM | POA: Diagnosis not present

## 2023-09-30 ENCOUNTER — Other Ambulatory Visit: Payer: Self-pay

## 2023-09-30 ENCOUNTER — Ambulatory Visit
Admission: RE | Admit: 2023-09-30 | Discharge: 2023-09-30 | Disposition: A | Discharge status: Home / Self Care | Source: Ambulatory Visit | Attending: Family Medicine | Admitting: Family Medicine

## 2023-09-30 VITALS — BP 150/75 | HR 76 | Temp 98.4°F | Resp 16

## 2023-09-30 DIAGNOSIS — R0981 Nasal congestion: Secondary | ICD-10-CM

## 2023-09-30 DIAGNOSIS — J0100 Acute maxillary sinusitis, unspecified: Secondary | ICD-10-CM

## 2023-09-30 DIAGNOSIS — J309 Allergic rhinitis, unspecified: Secondary | ICD-10-CM | POA: Diagnosis not present

## 2023-09-30 MED ORDER — PREDNISONE 50 MG PO TABS
ORAL_TABLET | ORAL | 0 refills | Status: DC
Start: 2023-09-30 — End: 2023-11-30

## 2023-09-30 MED ORDER — FEXOFENADINE HCL 180 MG PO TABS: 180.0000 mg | ORAL_TABLET | Freq: Every day | ORAL | 0 refills | Status: AC

## 2023-09-30 MED ORDER — DOXYCYCLINE HYCLATE 100 MG PO CAPS: 100.0000 mg | ORAL_CAPSULE | Freq: Two times a day (BID) | ORAL | 0 refills | Status: AC

## 2023-09-30 NOTE — ED Provider Notes (Signed)
 [facility] Garza [facility]    CSN: [id number] Arrival date & time: 09/30/23  1626      History   Chief Complaint Chief Complaint  Patient presents with   Nasal Congestion    Follow up from visit on 09/13/23 for sinus infection.  Have taken all meds and still have congestion and drainage. - Entered by patient    HPI Jamie Garza is a 68 y.o. female.   HPI Pleasant 68 year old female presents with continued nasal sinus congestion from 09/13/2023 in which she was evaluated and treated for sinus infection.  Please see epic for that encounter note.  PMH significant for CAD, grade 2 diastolic dysfunction, and HTN.  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   SLEEP APNEA 01/04/2007   Qualifier: Diagnosis of  By: Baird FNP, Frieda Kipper    Spinal stenosis, lumbar    L4 and L5 - severe; L3 and L4 - moderate   Stress reaction 07/18/2018   Tricuspid regurgitation    moderate by echo 06/2022   Wrist fracture 11/2019   right     Patient Active Problem List   Diagnosis Date Noted   Grade II diastolic dysfunction    Anemia 10/26/2022   Tricuspid regurgitation 07/06/2022   Aortic atherosclerosis (HCC) 05/20/2022   Osteopenia 10/23/2021   Post-nasal drip 09/04/2021   Estrogen deficiency 10/21/2020   Tinnitus 04/13/2020   CAD (coronary artery disease), native coronary artery    Pulmonary nodule 06/01/2019   H/O cold sores 01/04/2019   Family history of  heart disease 08/05/2018   Essential hypertension 06/14/2018   Spinal stenosis 09/02/2016   Hyperlipidemia 09/02/2016   Routine general medical examination at a health care facility 12/15/2010   Insomnia 11/20/2008   Sleep apnea 01/04/2007    Past Surgical History:  Procedure Laterality Date   CARPAL TUNNEL RELEASE Right 12/2018   COLONOSCOPY  08/2009   Jakie  tic's   TOTAL KNEE ARTHROPLASTY Left 08/04/2022   Procedure: TOTAL KNEE ARTHROPLASTY;  Surgeon: Beverley Evalene BIRCH, MD;  Location: [facility];  Service: Orthopedics;  Laterality: Left;   WRIST FRACTURE SURGERY  2013    OB History   No obstetric history on file.      Home Medications    Prior to Admission medications   Medication Sig Start Date End Date Taking? Authorizing Provider  doxycycline  (VIBRAMYCIN ) 100 MG capsule Take 1 capsule (100 mg total) by mouth 2 (two) times daily for 7 days. 09/30/23 10/07/23 Yes Teddy Sharper, FNP  fexofenadine  (ALLEGRA  ALLERGY) 180 MG tablet Take 1 tablet (180 mg total) by mouth daily for 15 days. 09/30/23 10/15/23 Yes Teddy Sharper, FNP  predniSONE  (DELTASONE ) 50 MG tablet Take 1 tab p.o. daily for 5 days. 09/30/23  Yes Teddy Sharper, FNP  aspirin  EC 81 MG tablet Take 81 mg by mouth daily. Swallow whole.    [provider]  atorvastatin  (LIPITOR) 40  MG tablet Take 1 tablet (40 mg total) by mouth daily. 04/29/23   Shlomo Wilbert SAUNDERS, MD  benzonatate  (TESSALON ) 200 MG capsule Take 1 capsule (200 mg total) by mouth 3 (three) times daily as needed for cough. 09/13/23   Murrill, Samantha, FNP  betamethasone dipropionate 0.05 % lotion Apply 1 application  topically daily as needed (dry scalp). 06/03/21   [provider]  Ca Carbonate-Mag Hydroxide (ROLAIDS EXTRA STRENGTH PO) Take 1 tablet by mouth every morning. Takes for Calcium  supplement    [provider]  Cholecalciferol (VITAMIN D3) 50 MCG (2000 UT) capsule Take 2,000 Units by mouth daily.    [provider]   fluticasone  (FLONASE ) 50 MCG/ACT nasal spray Place 2 sprays into both nostrils daily. Shake well before use. Gently blow nose before spraying. Do not blow nose immediately after use. You should not taste the medication or feel it going down your throat; if you do, adjust your technique. 09/13/23   Murrill, Samantha, FNP  losartan  (COZAAR ) 100 MG tablet TAKE ONE TABLET BY MOUTH EVERY DAY 08/02/23   Shlomo Wilbert SAUNDERS, MD  methocarbamol  (ROBAXIN ) 500 MG tablet Take 1 tablet (500 mg total) by mouth every 8 (eight) hours as needed for muscle spasms (back pain). Caution of sedation 10/26/22   Tower, Laine LABOR, MD  metroNIDAZOLE (METROGEL) 0.75 % gel Apply topically. Apply topically daily Rosacea Cream    [provider]  Propylene Glycol (SYSTANE COMPLETE) 0.6 % SOLN Place 1 drop into both eyes daily as needed (Dry eyes).    [provider]  traZODone  (DESYREL ) 50 MG tablet TAKE ONE TABLET BY MOUTH AT BEDTIME 02/01/23   Tower, Laine LABOR, MD  TURMERIC PO Take 2,000 mg by mouth daily. 1000 mg each    [provider]  valACYclovir  (VALTREX ) 1000 MG tablet TAKE TWO TABLETS BY MOUTH EVERY TWELVE HOURS FOR ONE DAY FOR FEVER BLISTER/COLD SORE 04/13/23   Tower, Laine LABOR, MD    Family History Family History  Problem Relation Age of Onset   Diabetes Mother        borderline DM   Aortic stenosis Mother    Diabetes Maternal Aunt    Diabetes Maternal Grandfather    Diabetes Paternal Grandfather    CAD Brother    Aortic stenosis Brother    Breast cancer Neg Hx    Colon cancer Neg Hx    Colon polyps Neg Hx    Esophageal cancer Neg Hx    Rectal cancer Neg Hx    Stomach cancer Neg Hx     Social History Social History   Tobacco Use   Smoking status: Never   Smokeless tobacco: Never  Vaping Use   Vaping status: Never Used  Substance Use Topics   Alcohol use: Yes    Comment: occasional   Drug use: No     Allergies   Patient has no known allergies.   Review of Systems Review of  Systems   Physical Exam Triage Vital Signs ED Triage Vitals  Encounter Vitals Group     BP      Girls Systolic BP Percentile      Girls Diastolic BP Percentile      Boys Systolic BP Percentile      Boys Diastolic BP Percentile      Pulse      Resp      Temp      Temp src      SpO2      Weight  Height      Head Circumference      Peak Flow      Pain Score      Pain Loc      Pain Education      Exclude from Growth Chart    No data found.  Updated Vital Signs BP (!) 150/75   Pulse 76   Temp 98.4 F (36.9 C)   Resp 16   SpO2 96%   Visual Acuity Right Eye Distance:   Left Eye Distance:   Bilateral Distance:    Right Eye Near:   Left Eye Near:    Bilateral Near:     Physical Exam Vitals and nursing note reviewed.  Constitutional:      Appearance: Normal appearance. She is normal weight.  HENT:     Head: Normocephalic and atraumatic.     Right Ear: Tympanic membrane and external ear normal.     Left Ear: Tympanic membrane and external ear normal.     Ears:     Comments: Significant eustachian tube dysfunction noted bilaterally    Nose:     Right Sinus: Maxillary sinus tenderness present.     Left Sinus: Maxillary sinus tenderness present.     Comments: Turbinates are erythematous/edematous    Mouth/Throat:     Mouth: Mucous membranes are moist.     Pharynx: Oropharynx is clear.     Comments: Moderate amount of clear drainage of posterior oropharynx noted Eyes:     Extraocular Movements: Extraocular movements intact.     Pupils: Pupils are equal, round, and reactive to light.  Cardiovascular:     Rate and Rhythm: Normal rate and regular rhythm.     Pulses: Normal pulses.     Heart sounds: Normal heart sounds.  Pulmonary:     Effort: Pulmonary effort is normal.     Breath sounds: Normal breath sounds. No wheezing or rales.  Musculoskeletal:        General: Normal range of motion.  Skin:    General: Skin is warm and dry.  Neurological:      General: No focal deficit present.     Mental Status: She is alert and oriented to person, place, and time. Mental status is at baseline.  Psychiatric:        Mood and Affect: Mood normal.        Behavior: Behavior normal.        Thought Content: Thought content normal.      UC Treatments / Results  Labs (all labs ordered are listed, but only abnormal results are displayed) Labs Reviewed - No data to display  EKG   Radiology No results found.  Procedures Procedures (including critical care time)  Medications Ordered in UC Medications - No data to display  Initial Impression / Assessment and Plan / UC Course  I have reviewed the triage vital signs and the nursing notes.  Pertinent labs & imaging results that were available during my care of the patient were reviewed by me and considered in my medical decision making (see chart for details).     MDM: 1.  Acute maxillary sinusitis, recurrence not specified-Rx'd doxycycline  100 mg capsule: Take 1 capsule twice daily x 7 days; 2.  Congestion of nasal sinus-Rx prednisone  50 mg tablet: Take 1 tablet daily x 5 days; 3.  Allergic rhinitis, unspecified seasonality, unspecified trigger-Rx'd Allegra  180 mg tablet: Take 1 tablet daily x 5 days. Advised patient to take medications as directed with food to  completion.  Advised patient to take prednisone  and Allegra  with first dose of doxycycline  for the next 5 of 7 days.  Advised may use Allegra  as needed and afterwards for concurrent postnasal drainage/drip.  Encouraged increase daily water  intake to 64 ounces per day while taking these medications.  Advised if symptoms worsen and/or unresolved please follow-up with your PCP or here for further evaluation.  Patient discharged home, hemodynamically stable. Final Clinical Impressions(s) / UC Diagnoses   Final diagnoses:  Acute maxillary sinusitis, recurrence not specified  Congestion of nasal sinus  Allergic rhinitis, unspecified seasonality,  unspecified trigger     Discharge Instructions      Advised patient to take medications as directed with food to completion.  Advised patient to take prednisone  and Allegra  with first dose of doxycycline  for the next 5 of 7 days.  Advised may use Allegra  as needed and afterwards for concurrent postnasal drainage/drip.  Encouraged increase daily water  intake to 64 ounces per day while taking these medications.  Advised if symptoms worsen and/or unresolved please follow-up with your PCP or here for further evaluation     ED Prescriptions     Medication Sig Dispense Auth. Provider   predniSONE  (DELTASONE ) 50 MG tablet Take 1 tab p.o. daily for 5 days. 5 tablet Theta Leaf, FNP   doxycycline  (VIBRAMYCIN ) 100 MG capsule Take 1 capsule (100 mg total) by mouth 2 (two) times daily for 7 days. 14 capsule Ander Wamser, FNP   fexofenadine  (ALLEGRA  ALLERGY) 180 MG tablet Take 1 tablet (180 mg total) by mouth daily for 15 days. 15 tablet Sarrinah Gardin, FNP      PDMP not reviewed this encounter.   Teddy Sharper, FNP 09/30/23 1743

## 2023-09-30 NOTE — Discharge Instructions (Addendum)
 Advised patient to take medications as directed with food to completion.  Advised patient to take prednisone  and Allegra  with first dose of doxycycline  for the next 5 of 7 days.  Advised may use Allegra  as needed and afterwards for concurrent postnasal drainage/drip.  Encouraged increase daily water  intake to 64 ounces per day while taking these medications.  Advised if symptoms worsen and/or unresolved please follow-up with your PCP or here for further evaluation

## 2023-09-30 NOTE — ED Triage Notes (Signed)
 Seen on 8/25 for sinus infection. Still sick. Has completed tx for sinus infection, reports she still has yellow drainage out of nose, sinus pressure. Reports she feels better than she initially did before tx however.

## 2023-10-07 ENCOUNTER — Encounter: Payer: Self-pay | Admitting: Cardiology

## 2023-10-08 ENCOUNTER — Other Ambulatory Visit: Payer: Self-pay | Admitting: Family Medicine

## 2023-10-08 DIAGNOSIS — Z1231 Encounter for screening mammogram for malignant neoplasm of breast: Secondary | ICD-10-CM

## 2023-10-28 ENCOUNTER — Encounter: Payer: Medicare Other | Admitting: Family Medicine

## 2023-10-28 ENCOUNTER — Telehealth: Payer: Self-pay | Admitting: Family Medicine

## 2023-10-28 DIAGNOSIS — E7800 Pure hypercholesterolemia, unspecified: Secondary | ICD-10-CM

## 2023-10-28 DIAGNOSIS — Z0000 Encounter for general adult medical examination without abnormal findings: Secondary | ICD-10-CM

## 2023-10-28 DIAGNOSIS — I10 Essential (primary) hypertension: Secondary | ICD-10-CM

## 2023-10-28 DIAGNOSIS — D508 Other iron deficiency anemias: Secondary | ICD-10-CM

## 2023-10-28 NOTE — Addendum Note (Signed)
 Addended by: RANDEEN HARDY A on: 10/28/2023 09:21 AM   Modules accepted: Orders

## 2023-10-28 NOTE — Telephone Encounter (Signed)
 I put in orders for future [facility] lab draw  Let me know if I did not do it correctly  Thanks

## 2023-10-28 NOTE — Telephone Encounter (Signed)
 Patient would like to have lab orders in system will go to a [facility] one week prior to her appt. She thought her appt was at 9 this morning and arrive [phone] for her 8 am apt she wanted to pass along her apologies  for arriving late this morning

## 2023-10-29 ENCOUNTER — Other Ambulatory Visit: Payer: Self-pay | Admitting: Family Medicine

## 2023-10-29 ENCOUNTER — Other Ambulatory Visit: Payer: Self-pay | Admitting: Cardiology

## 2023-10-29 NOTE — Telephone Encounter (Signed)
 CPE scheduled 12/08/23  Last filled on 02/01/23 #90 tab/ 2 refills

## 2023-10-29 NOTE — Telephone Encounter (Signed)
 Labs released into [facility] system and pt notified

## 2023-11-15 ENCOUNTER — Ambulatory Visit
Admission: RE | Admit: 2023-11-15 | Discharge: 2023-11-15 | Disposition: A | Discharge status: Home / Self Care | Source: Ambulatory Visit | Attending: Family Medicine | Admitting: Family Medicine

## 2023-11-15 DIAGNOSIS — Z1231 Encounter for screening mammogram for malignant neoplasm of breast: Secondary | ICD-10-CM

## 2023-11-18 ENCOUNTER — Ambulatory Visit: Payer: Self-pay | Admitting: Family Medicine

## 2023-11-30 ENCOUNTER — Encounter: Payer: Self-pay | Admitting: Emergency Medicine

## 2023-11-30 ENCOUNTER — Ambulatory Visit
Admission: EM | Admit: 2023-11-30 | Discharge: 2023-11-30 | Disposition: A | Discharge status: Home / Self Care | Attending: Family Medicine | Admitting: Family Medicine

## 2023-11-30 DIAGNOSIS — R059 Cough, unspecified: Secondary | ICD-10-CM | POA: Diagnosis not present

## 2023-11-30 DIAGNOSIS — J111 Influenza due to unidentified influenza virus with other respiratory manifestations: Secondary | ICD-10-CM

## 2023-11-30 DIAGNOSIS — J069 Acute upper respiratory infection, unspecified: Secondary | ICD-10-CM

## 2023-11-30 DIAGNOSIS — H6123 Impacted cerumen, bilateral: Secondary | ICD-10-CM | POA: Diagnosis not present

## 2023-11-30 LAB — POC SOFIA SARS ANTIGEN FIA: SARS Coronavirus 2 Ag: NEGATIVE

## 2023-11-30 LAB — POCT INFLUENZA A/B
Influenza A, POC: NEGATIVE
Influenza B, POC: NEGATIVE

## 2023-11-30 MED ORDER — DOXYCYCLINE HYCLATE 100 MG PO CAPS
100.0000 mg | ORAL_CAPSULE | Freq: Two times a day (BID) | ORAL | 0 refills | Status: AC
Start: 2023-11-30 — End: 2023-12-07

## 2023-11-30 MED ORDER — HYDROCODONE BIT-HOMATROP MBR 5-1.5 MG/5ML PO SOLN: 5.0000 mL | Freq: Four times a day (QID) | ORAL | 0 refills | Status: DC | PRN

## 2023-11-30 MED ORDER — PREDNISONE 50 MG PO TABS: ORAL_TABLET | ORAL | 0 refills | Status: DC

## 2023-11-30 NOTE — ED Provider Notes (Signed)
 [facility] Garza [facility]    CSN: [id number] Arrival date & time: 11/30/23  9177      History   Chief Complaint Chief Complaint  Patient presents with   Cough    HPI Jamie Garza is a 68 y.o. female.   HPI Pleasant 68 year old female presents with.  PMH significant for CAD, HTN, and pulmonary hypertension.  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   SLEEP APNEA 01/04/2007   Qualifier: Diagnosis of  By: Baird FNP, Frieda Kipper    Spinal stenosis, lumbar    L4 and L5 - severe; L3 and L4 - moderate   Stress reaction 07/18/2018   Tricuspid regurgitation    moderate by echo 06/2022   Wrist fracture 11/2019   right     Patient Active Problem List   Diagnosis Date Noted   Grade II diastolic dysfunction    Anemia 10/26/2022   Tricuspid regurgitation 07/06/2022   Aortic atherosclerosis 05/20/2022   Osteopenia 10/23/2021   Post-nasal drip 09/04/2021   Estrogen deficiency 10/21/2020   Tinnitus 04/13/2020   CAD (coronary artery disease), native coronary artery    Pulmonary nodule 06/01/2019   H/O cold sores 01/04/2019   Family history of heart disease 08/05/2018   Essential hypertension 06/14/2018   Spinal stenosis 09/02/2016   Hyperlipidemia 09/02/2016   Routine general medical examination at a health care facility 12/15/2010   Insomnia 11/20/2008   Sleep apnea 01/04/2007    Past Surgical History:  Procedure Laterality Date   CARPAL  TUNNEL RELEASE Right 12/2018   COLONOSCOPY  08/2009   Jakie  tic's   TOTAL KNEE ARTHROPLASTY Left 08/04/2022   Procedure: TOTAL KNEE ARTHROPLASTY;  Surgeon: Beverley Evalene BIRCH, MD;  Location: [facility];  Service: Orthopedics;  Laterality: Left;   WRIST FRACTURE SURGERY  2013    OB History   No obstetric history on file.      Home Medications    Prior to Admission medications   Medication Sig Start Date End Date Taking? Authorizing Provider  aspirin  EC 81 MG tablet Take 81 mg by mouth daily. Swallow whole.   Yes [provider]  atorvastatin  (LIPITOR) 40 MG tablet Take 1 tablet (40 mg total) by mouth daily. 10/29/23  Yes Turner, Wilbert SAUNDERS, MD  betamethasone dipropionate 0.05 % lotion Apply 1 application  topically daily as needed (dry scalp). 06/03/21  Yes [provider]  Ca Carbonate-Mag Hydroxide (ROLAIDS EXTRA STRENGTH PO) Take 1 tablet by mouth every morning. Takes for Calcium  supplement   Yes [provider]  Cholecalciferol (VITAMIN D3) 50 MCG (2000 UT) capsule Take 2,000 Units by mouth daily.   Yes [provider]  doxycycline  (VIBRAMYCIN ) 100 MG capsule Take 1 capsule (100 mg total) by mouth 2 (two) times daily for 7 days. 11/30/23 12/07/23 Yes Teddy Sharper, FNP  fluticasone  (FLONASE ) 50 MCG/ACT nasal spray Place 2 sprays into both nostrils daily. Shake  well before use. Gently blow nose before spraying. Do not blow nose immediately after use. You should not taste the medication or feel it going down your throat; if you do, adjust your technique. 09/13/23  Yes Iola Lukes, FNP  HYDROcodone bit-homatropine (HYCODAN) 5-1.5 MG/5ML syrup Take 5 mLs by mouth every 6 (six) hours as needed for cough. 11/30/23  Yes Teddy Sharper, FNP  losartan  (COZAAR ) 100 MG tablet TAKE ONE TABLET BY MOUTH EVERY DAY 08/02/23  Yes Turner, Wilbert SAUNDERS, MD  methocarbamol  (ROBAXIN ) 500 MG tablet Take 1 tablet (500 mg total) by mouth every 8 (eight) hours as needed for muscle  spasms (back pain). Caution of sedation 10/26/22  Yes Tower, Marne A, MD  metroNIDAZOLE (METROGEL) 0.75 % gel Apply topically. Apply topically daily Rosacea Cream   Yes [provider]  predniSONE  (DELTASONE ) 50 MG tablet Take 1 tab p.o. daily for 5 days. 11/30/23  Yes Teddy Sharper, FNP  Propylene Glycol (SYSTANE COMPLETE) 0.6 % SOLN Place 1 drop into both eyes daily as needed (Dry eyes).   Yes [provider]  traZODone  (DESYREL ) 50 MG tablet TAKE ONE TABLET BY MOUTH AT BEDTIME 10/29/23  Yes Tower, Marne A, MD  TURMERIC PO Take 2,000 mg by mouth daily. 1000 mg each   Yes [provider]  valACYclovir  (VALTREX ) 1000 MG tablet TAKE TWO TABLETS BY MOUTH EVERY TWELVE HOURS FOR ONE DAY FOR FEVER BLISTER/COLD SORE 04/13/23  Yes Tower, Laine LABOR, MD  benzonatate  (TESSALON ) 200 MG capsule Take 1 capsule (200 mg total) by mouth 3 (three) times daily as needed for cough. 09/13/23   Murrill, Samantha, FNP  fexofenadine  (ALLEGRA  ALLERGY) 180 MG tablet Take 1 tablet (180 mg total) by mouth daily for 15 days. 09/30/23 10/15/23  Teddy Sharper, FNP    Family History Family History  Problem Relation Age of Onset   Diabetes Mother        borderline DM   Aortic stenosis Mother    Diabetes Maternal Aunt    Diabetes Maternal Grandfather    Diabetes Paternal Grandfather    CAD Brother    Aortic stenosis Brother    Breast cancer Neg Hx    Colon cancer Neg Hx    Colon polyps Neg Hx    Esophageal cancer Neg Hx    Rectal cancer Neg Hx    Stomach cancer Neg Hx     Social History Social History   Tobacco Use   Smoking status: Never   Smokeless tobacco: Never  Vaping Use   Vaping status: Never Used  Substance Use Topics   Alcohol use: Yes    Comment: occasional   Drug use: No     Allergies   Patient has no known allergies.   Review of Systems Review of Systems  All other systems reviewed and are negative.    Physical Exam Triage Vital Signs ED Triage Vitals   Encounter Vitals Group     BP      Girls Systolic BP Percentile      Girls Diastolic BP Percentile      Boys Systolic BP Percentile      Boys Diastolic BP Percentile      Pulse      Resp      Temp      Temp src      SpO2      Weight      Height      Head Circumference      Peak Flow  Pain Score      Pain Loc      Pain Education      Exclude from Growth Chart    No data found.  Updated Vital Signs BP (!) 151/80 (BP Location: Right Arm)   Pulse 81   Temp 98.8 F (37.1 C) (Oral)   Resp 18   Ht 5' 1 (1.549 m)   Wt 145 lb (65.8 kg)   SpO2 98%   BMI 27.40 kg/m   Physical Exam Vitals and nursing note reviewed.  Constitutional:      General: She is not in acute distress.    Appearance: Normal appearance. She is normal weight. She is ill-appearing.  HENT:     Head: Normocephalic and atraumatic.     Right Ear: External ear normal.     Left Ear: External ear normal.     Ears:     Comments: Bilateral EACs occluded with excessive cerumen unable to visualize either TM.  Post bilateral ear lavage: Left EAC-clear, Left TM-clear, mobile with good light reflex; Right EAC-clear, Right TM-clear, mobile with good light reflex    Mouth/Throat:     Mouth: Mucous membranes are moist.     Pharynx: Oropharynx is clear.     Comments: Moderate amount of clear drainage of posterior oropharynx noted Eyes:     Extraocular Movements: Extraocular movements intact.     Conjunctiva/sclera: Conjunctivae normal.     Pupils: Pupils are equal, round, and reactive to light.  Cardiovascular:     Rate and Rhythm: Normal rate and regular rhythm.     Heart sounds: Normal heart sounds.  Pulmonary:     Effort: Pulmonary effort is normal.     Breath sounds: Normal breath sounds. No wheezing, rhonchi or rales.     Comments: Infrequent nonproductive cough on exam Musculoskeletal:        General: Normal range of motion.     Cervical back: Normal range of motion and neck supple.  Skin:     General: Skin is warm and dry.  Neurological:     General: No focal deficit present.     Mental Status: She is alert and oriented to person, place, and time. Mental status is at baseline.  Psychiatric:        Mood and Affect: Mood normal.        Behavior: Behavior normal.      UC Treatments / Results  Labs (all labs ordered are listed, but only abnormal results are displayed) Labs Reviewed  POCT INFLUENZA A/B - Normal  POC SOFIA SARS ANTIGEN FIA - Normal    EKG   Radiology No results found.  Procedures Procedures (including critical care time)  Medications Ordered in UC Medications - No data to display  Initial Impression / Assessment and Plan / UC Course  I have reviewed the triage vital signs and the nursing notes.  Pertinent labs & imaging results that were available during my care of the patient were reviewed by me and considered in my medical decision making (see chart for details).     MDM: 1.  Acute URI-Rx'd doxycycline  100 mg capsule: Take 1 capsule twice daily x 7 days; 2.  Cough, unspecified type-Rx'd prednisone  50 mg tablet: Take 1 tablet daily x 5 days, Rx'd Hycodan 5/1.5 mg / 5 mL syrup: Take 5 mL every 6 hours, as needed for cough; 3.  Bilateral impacted cerumen-resolved with bilateral ear lavage; 4.  Influenza-like illness-influenza A/B and COVID-19 both negative today, patient advised.  Advised patient take medications as directed with food to completion.  Advised take prednisone  with first dose of doxycycline  for the next 5 of 7 days.  Advised may use Hycodan cough syrup at night prior to sleep due to sedative effects.  Encouraged to increase daily water  intake to 64 ounces per day while taking these medications.  Advised if symptoms worsen and/or unresolved please follow-up with your PCP or here for further evaluation.  Work note provided to patient prior to discharge today.  Patient discharged home, hemodynamically stable. Final Clinical Impressions(s) / UC  Diagnoses   Final diagnoses:  Cough, unspecified type  Influenza-like illness  Bilateral impacted cerumen  Acute URI     Discharge Instructions      Advised patient take medications as directed with food to completion.  Advised take prednisone  with first dose of doxycycline  for the next 5 of 7 days.  Advised may use Hycodan cough syrup at night prior to sleep due to sedative effects.  Encouraged to increase daily water  intake to 64 ounces per day while taking these medications.  Advised if symptoms worsen and/or unresolved please follow-up with your PCP or here for further evaluation.     ED Prescriptions     Medication Sig Dispense Auth. Provider   doxycycline  (VIBRAMYCIN ) 100 MG capsule Take 1 capsule (100 mg total) by mouth 2 (two) times daily for 7 days. 14 capsule Elton Heid, FNP   predniSONE  (DELTASONE ) 50 MG tablet Take 1 tab p.o. daily for 5 days. 5 tablet Rhyan Wolters, FNP   HYDROcodone bit-homatropine (HYCODAN) 5-1.5 MG/5ML syrup Take 5 mLs by mouth every 6 (six) hours as needed for cough. 120 mL Teddy Sharper, FNP      I have reviewed the PDMP during this encounter.   Teddy Sharper, FNP 11/30/23 [phone]

## 2023-11-30 NOTE — Discharge Instructions (Addendum)
 Advised patient take medications as directed with food to completion.  Advised take prednisone  with first dose of doxycycline  for the next 5 of 7 days.  Advised may use Hycodan cough syrup at night prior to sleep due to sedative effects.  Encouraged to increase daily water  intake to 64 ounces per day while taking these medications.  Advised if symptoms worsen and/or unresolved please follow-up with your PCP or here for further evaluation.

## 2023-11-30 NOTE — ED Triage Notes (Signed)
 Patient c/o non-productive cough x 2 days, nasal drainage, sneezing and some ear discomfort.  Patient has taken Allegra .  Thinks it may be a sinus infection.

## 2023-12-02 DIAGNOSIS — E7800 Pure hypercholesterolemia, unspecified: Secondary | ICD-10-CM | POA: Diagnosis not present

## 2023-12-03 LAB — CBC WITH DIFFERENTIAL/PLATELET
Basophils Absolute: 0 x10E3/uL (ref 0.0–0.2)
Basos: 1 %
EOS (ABSOLUTE): 0.1 x10E3/uL (ref 0.0–0.4)
Eos: 1 %
Hematocrit: 41.9 % (ref 34.0–46.6)
Hemoglobin: 13.6 g/dL (ref 11.1–15.9)
Immature Grans (Abs): 0 x10E3/uL (ref 0.0–0.1)
Immature Granulocytes: 0 %
Lymphocytes Absolute: 2.9 x10E3/uL (ref 0.7–3.1)
Lymphs: 39 %
MCH: 30.5 pg (ref 26.6–33.0)
MCHC: 32.5 g/dL (ref 31.5–35.7)
MCV: 94 fL (ref 79–97)
Monocytes Absolute: 1.4 x10E3/uL — ABNORMAL HIGH (ref 0.1–0.9)
Monocytes: 19 %
Neutrophils Absolute: 3 x10E3/uL (ref 1.4–7.0)
Neutrophils: 40 %
Platelets: 230 x10E3/uL (ref 150–450)
RBC: 4.46 x10E6/uL (ref 3.77–5.28)
RDW: 13.7 % (ref 11.7–15.4)
WBC: 7.3 x10E3/uL (ref 3.4–10.8)

## 2023-12-03 LAB — COMPREHENSIVE METABOLIC PANEL WITH GFR
ALT: 26 IU/L (ref 0–32)
AST: 32 IU/L (ref 0–40)
Albumin: 4.3 g/dL (ref 3.9–4.9)
Alkaline Phosphatase: 88 IU/L (ref 49–135)
BUN/Creatinine Ratio: 28 (ref 12–28)
BUN: 20 mg/dL (ref 8–27)
Bilirubin Total: 0.3 mg/dL (ref 0.0–1.2)
CO2: 23 mmol/L (ref 20–29)
Calcium: 9.8 mg/dL (ref 8.7–10.3)
Chloride: 105 mmol/L (ref 96–106)
Creatinine, Ser: 0.72 mg/dL (ref 0.57–1.00)
Globulin, Total: 2.7 g/dL (ref 1.5–4.5)
Glucose: 102 mg/dL — ABNORMAL HIGH (ref 70–99)
Potassium: 4.2 mmol/L (ref 3.5–5.2)
Sodium: 142 mmol/L (ref 134–144)
Total Protein: 7 g/dL (ref 6.0–8.5)
eGFR: 90 mL/min/1.73 (ref >59)

## 2023-12-03 LAB — LIPID PANEL
Chol/HDL Ratio: 2.4 ratio (ref 0.0–4.4)
Cholesterol, Total: 142 mg/dL (ref 100–199)
HDL: 59 mg/dL (ref >39)
LDL Chol Calc (NIH): 68 mg/dL (ref 0–99)
Triglycerides: 74 mg/dL (ref 0–149)
VLDL Cholesterol Cal: 15 mg/dL (ref 5–40)

## 2023-12-03 LAB — TSH: TSH: 2.67 u[IU]/mL (ref 0.450–4.500)

## 2023-12-05 ENCOUNTER — Ambulatory Visit: Payer: Self-pay | Admitting: Family Medicine

## 2023-12-08 ENCOUNTER — Ambulatory Visit: Admitting: Family Medicine

## 2023-12-08 ENCOUNTER — Encounter: Payer: Self-pay | Admitting: Family Medicine

## 2023-12-08 VITALS — BP 128/80 | HR 59 | Temp 98.4°F | Ht 60.75 in | Wt 150.5 lb

## 2023-12-08 DIAGNOSIS — Z0000 Encounter for general adult medical examination without abnormal findings: Secondary | ICD-10-CM

## 2023-12-08 DIAGNOSIS — E7800 Pure hypercholesterolemia, unspecified: Secondary | ICD-10-CM | POA: Diagnosis not present

## 2023-12-08 DIAGNOSIS — M85859 Other specified disorders of bone density and structure, unspecified thigh: Secondary | ICD-10-CM

## 2023-12-08 DIAGNOSIS — Z23 Encounter for immunization: Secondary | ICD-10-CM | POA: Diagnosis not present

## 2023-12-08 DIAGNOSIS — I10 Essential (primary) hypertension: Secondary | ICD-10-CM

## 2023-12-08 DIAGNOSIS — F5104 Psychophysiologic insomnia: Secondary | ICD-10-CM

## 2023-12-08 NOTE — Assessment & Plan Note (Signed)
 Reviewed health habits including diet and exercise and skin cancer prevention Reviewed appropriate screening tests for age  Also reviewed health mt list, fam hx and immunization status , as well as social and family history   See HPI Labs reviewed and ordered Health Maintenance  Topic Date Due   Hepatitis C Screening  Never done   COVID-19 Vaccine (7 - 2025-26 season) 12/23/2025*   Medicare Annual Wellness Visit  06/14/2024   Breast Cancer Screening  11/14/2024   DTaP/Tdap/Td vaccine (3 - Td or Tdap) 10/31/2028   Colon Cancer Screening  06/27/2030   Pneumococcal Vaccine for age over 25  Completed   Flu Shot  Completed   Osteoporosis screening with Bone Density Scan  Completed   Zoster (Shingles) Vaccine  Completed   Meningitis B Vaccine  Aged Out  *Topic was postponed. The date shown is not the original due date.    Flu shot given Discussed fall prevention, supplements and exercise for bone density  PHQ 0 Discussed exercise goals

## 2023-12-08 NOTE — Patient Instructions (Addendum)
 Flu shot today   I do recommend a covid shot at the pharmacy    Keep exercising  Add some strength training to your routine, this is important for bone and brain health and can reduce your risk of falls and help your body use insulin properly and regulate weight  Light weights, exercise bands , and internet videos are a good way to start  Yoga (chair or regular), machines , floor exercises or a gym with machines are also good options   Consider silver sneakers   Avoid added sugars in your diet when you can  Try to get most of your carbohydrates from produce (with the exception of white potatoes) and whole grains Eat less bread/pasta/rice/snack foods/cereals/sweets and other items from the middle of the grocery store (processed carbs)

## 2023-12-08 NOTE — Assessment & Plan Note (Signed)
 Dexa 02/2023 Discussed fall prevention, supplements and exercise for bone density  No falls or fractures

## 2023-12-08 NOTE — Assessment & Plan Note (Signed)
 Disc goals for lipids and reasons to control them Rev last labs with pt Rev low sat fat diet in detail LDL well controlled at 68 Taking atorvastatin  40 mg daily  History of CAD and aortic atherosclerosis

## 2023-12-08 NOTE — Assessment & Plan Note (Signed)
Continues prn trazodone
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
## 2023-12-08 NOTE — Assessment & Plan Note (Signed)
 bp in fair control at this time  BP Readings from Last 1 Encounters:  12/08/23 128/80   No changes needed Most recent labs reviewed  Disc lifstyle change with low sodium diet and exercise  Losartan  100 mg daily

## 2023-12-14 ENCOUNTER — Other Ambulatory Visit: Payer: Self-pay

## 2023-12-14 ENCOUNTER — Ambulatory Visit
Admission: EM | Admit: 2023-12-14 | Discharge: 2023-12-14 | Disposition: A | Discharge status: Home / Self Care | Attending: Family Medicine | Admitting: Family Medicine

## 2023-12-14 DIAGNOSIS — L0889 Other specified local infections of the skin and subcutaneous tissue: Secondary | ICD-10-CM | POA: Diagnosis not present

## 2023-12-14 DIAGNOSIS — L02811 Cutaneous abscess of head [any part, except face]: Secondary | ICD-10-CM

## 2023-12-14 DIAGNOSIS — L309 Dermatitis, unspecified: Secondary | ICD-10-CM | POA: Diagnosis not present

## 2023-12-14 MED ORDER — DOXYCYCLINE HYCLATE 100 MG PO CAPS: 100.0000 mg | ORAL_CAPSULE | Freq: Two times a day (BID) | ORAL | 0 refills | Status: AC

## 2023-12-14 NOTE — Discharge Instructions (Addendum)
 Advised patient take medication as directed with food to completion.  Encouraged to increase daily water  intake to 64 ounces per day while taking this medication.  Advised if symptoms worsen and/or unresolved please follow-up with your PCP or dermatology for further evaluation.

## 2023-12-14 NOTE — ED Provider Notes (Signed)
 [facility]    CSN: [id number] Arrival date & time: 12/14/23  0801      History   Chief Complaint Chief Complaint  Patient presents with   Headache    HPI Jamie Garza is a 68 y.o. female.   HPI pleasant 68 year old female presents with possible abscess of scalp for 3 days.  PMH significant for sleep apnea, pulmonary hypertension, and HLD.  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   SLEEP APNEA 01/04/2007   Qualifier: Diagnosis of  By: Baird FNP, Frieda Kipper    Spinal stenosis, lumbar    L4 and L5 - severe; L3 and L4 - moderate   Stress reaction 07/18/2018   Tricuspid regurgitation    moderate by echo 06/2022   Wrist fracture 11/2019   right     Patient Active Problem List   Diagnosis Date Noted   Grade II diastolic dysfunction    Tricuspid regurgitation 07/06/2022   Aortic atherosclerosis 05/20/2022   Osteopenia 10/23/2021   Post-nasal drip 09/04/2021   Estrogen deficiency 10/21/2020   Tinnitus 04/13/2020   CAD (coronary artery disease), native coronary artery    Pulmonary nodule 06/01/2019   H/O cold sores 01/04/2019   Family history of heart disease 08/05/2018   Essential hypertension 06/14/2018   Spinal stenosis 09/02/2016   Hyperlipidemia 09/02/2016   Routine general medical examination at a health care facility 12/15/2010   Insomnia 11/20/2008   Sleep apnea 01/04/2007    Past Surgical History:  Procedure  Laterality Date   CARPAL TUNNEL RELEASE Right 12/2018   COLONOSCOPY  08/2009   Jakie  tic's   FRACTURE SURGERY  04/2011   JOINT REPLACEMENT  7/24   Knee   TOTAL KNEE ARTHROPLASTY Left 08/04/2022   Procedure: TOTAL KNEE ARTHROPLASTY;  Surgeon: Beverley Evalene BIRCH, MD;  Location: [facility];  Service: Orthopedics;  Laterality: Left;   WRIST FRACTURE SURGERY  2013    OB History   No obstetric history on file.      Home Medications    Prior to Admission medications   Medication Sig Start Date End Date Taking? Authorizing Provider  doxycycline  (VIBRAMYCIN ) 100 MG capsule Take 1 capsule (100 mg total) by mouth 2 (two) times daily for 10 days. 12/14/23 12/24/23 Yes Teddy Sharper, FNP  aspirin  EC 81 MG tablet Take 81 mg by mouth daily. Swallow whole.    [provider]  atorvastatin  (LIPITOR) 40 MG tablet Take 1 tablet (40 mg total) by mouth daily. 10/29/23   Shlomo Wilbert SAUNDERS, MD  betamethasone dipropionate 0.05 % lotion Apply 1 application  topically daily as needed (dry scalp). 06/03/21   [provider]  Ca Carbonate-Mag Hydroxide (ROLAIDS EXTRA STRENGTH PO) Take 1 tablet by mouth every morning. Takes for Calcium  supplement    [provider]  Cholecalciferol (VITAMIN D3) 50 MCG (2000 UT) capsule Take 2,000 Units by mouth daily.  [provider]  fexofenadine  (ALLEGRA  ALLERGY) 180 MG tablet Take 1 tablet (180 mg total) by mouth daily for 15 days. 09/30/23 12/08/23  Teddy Sharper, FNP  fluticasone  (FLONASE ) 50 MCG/ACT nasal spray Place 2 sprays into both nostrils daily. Shake well before use. Gently blow nose before spraying. Do not blow nose immediately after use. You should not taste the medication or feel it going down your throat; if you do, adjust your technique. 09/13/23   Iola Lukes, FNP  losartan  (COZAAR ) 100 MG tablet TAKE ONE TABLET BY MOUTH EVERY DAY 08/02/23   Shlomo Wilbert SAUNDERS, MD  metroNIDAZOLE (METROGEL) 0.75 % gel Apply topically. Apply  topically daily Rosacea Cream    [provider]  Propylene Glycol (SYSTANE COMPLETE) 0.6 % SOLN Place 1 drop into both eyes daily as needed (Dry eyes).    [provider]  traZODone  (DESYREL ) 50 MG tablet TAKE ONE TABLET BY MOUTH AT BEDTIME 10/29/23   Tower, Kelford A, MD  TURMERIC PO Take 2,000 mg by mouth daily. 1000 mg each    [provider]  valACYclovir  (VALTREX ) 1000 MG tablet TAKE TWO TABLETS BY MOUTH EVERY TWELVE HOURS FOR ONE DAY FOR FEVER BLISTER/COLD SORE 04/13/23   Tower, Laine LABOR, MD    Family History Family History  Problem Relation Age of Onset   Diabetes Mother        borderline DM   Aortic stenosis Mother    Diabetes Maternal Aunt    Diabetes Maternal Grandfather    Diabetes Paternal Grandfather    Arthritis Father    CAD Brother    Aortic stenosis Brother    Heart disease Brother    Breast cancer Neg Hx    Colon cancer Neg Hx    Colon polyps Neg Hx    Esophageal cancer Neg Hx    Rectal cancer Neg Hx    Stomach cancer Neg Hx     Social History Social History   Tobacco Use   Smoking status: Never   Smokeless tobacco: Never  Vaping Use   Vaping status: Never Used  Substance Use Topics   Alcohol use: Not Currently    Comment: occasional   Drug use: No     Allergies   Patient has no known allergies.   Review of Systems Review of Systems  Skin:  Positive for rash.     Physical Exam Triage Vital Signs ED Triage Vitals  Encounter Vitals Group     BP 12/14/23 0826 (!) 150/87     Girls Systolic BP Percentile --      Girls Diastolic BP Percentile --      Boys Systolic BP Percentile --      Boys Diastolic BP Percentile --      Pulse Rate 12/14/23 0826 71     Resp 12/14/23 0826 18     Temp 12/14/23 0826 98.6 F (37 C)     Temp src --      SpO2 12/14/23 0826 96 %     Weight 12/14/23 0829 148 lb (67.1 kg)     Height 12/14/23 0829 5' 1 (1.549 m)     Head Circumference --      Peak Flow --      Pain Score 12/14/23 0828  7     Pain Loc --      Pain Education --      Exclude from Growth Chart --    No data found.  Updated Vital Signs BP (!) 150/87 (  BP Location: Right Arm)   Pulse 71   Temp 98.6 F (37 C)   Resp 18   Ht 5' 1 (1.549 m)   Wt 148 lb (67.1 kg)   SpO2 96%   BMI 27.96 kg/m    Physical Exam Vitals and nursing note reviewed.  Constitutional:      Appearance: Normal appearance. She is normal weight.  HENT:     Head: Normocephalic and atraumatic.     Mouth/Throat:     Mouth: Mucous membranes are moist.     Pharynx: Oropharynx is clear.  Eyes:     Extraocular Movements: Extraocular movements intact.     Conjunctiva/sclera: Conjunctivae normal.     Pupils: Pupils are equal, round, and reactive to light.  Cardiovascular:     Rate and Rhythm: Normal rate and regular rhythm.     Heart sounds: Normal heart sounds.  Pulmonary:     Effort: Pulmonary effort is normal.     Breath sounds: Normal breath sounds. No wheezing, rhonchi or rales.  Musculoskeletal:        General: Normal range of motion.  Skin:    General: Skin is warm and dry.     Comments: Scalp right sided (occipital parietal area): tiny (~1.0 cm x 1.0 cm) non-erythematous, non-indurated abscess noted, coalesced scales and plaques noted please see image below  Neurological:     General: No focal deficit present.     Mental Status: She is alert and oriented to person, place, and time. Mental status is at baseline.  Psychiatric:        Mood and Affect: Mood normal.        Behavior: Behavior normal.      UC Treatments / Results  Labs (all labs ordered are listed, but only abnormal results are displayed) Labs Reviewed - No data to display  EKG   Radiology No results found.  Procedures Procedures (including critical care time)  Medications Ordered in UC Medications - No data to display  Initial Impression / Assessment and Plan / UC Course  I have reviewed the triage vital signs and the nursing  notes.  Pertinent labs & imaging results that were available during my care of the patient were reviewed by me and considered in my medical decision making (see chart for details).     MDM: 1.  Abscess of scalp-Rx'd doxycycline  100 mg capsule: Take 1 capsule twice daily x 10 days. Advised patient take medication as directed with food to completion.  Encouraged to increase daily water  intake to 64 ounces per day while taking this medication.  Advised if symptoms worsen and/or unresolved please follow-up with your PCP or dermatology for further evaluation.  Patient discharged home, hemodynamically stable. Final Clinical Impressions(s) / UC Diagnoses   Final diagnoses:  Abscess of scalp     Discharge Instructions      Advised patient take medication as directed with food to completion.  Encouraged to increase daily water  intake to 64 ounces per day while taking this medication.  Advised if symptoms worsen and/or unresolved please follow-up with your PCP or dermatology for further evaluation.     ED Prescriptions     Medication Sig Dispense Auth. Provider   doxycycline  (VIBRAMYCIN ) 100 MG capsule Take 1 capsule (100 mg total) by mouth 2 (two) times daily for 10 days. 20 capsule Latissa Frick, FNP      PDMP not reviewed this encounter.   Teddy Sharper, FNP 12/14/23 [phone]

## 2023-12-14 NOTE — ED Triage Notes (Signed)
 Pt presenting c/o pain and tenderness to a raised area to the back of her head  present x 3 days.  Pt stated that he pain radiates to the right side o her head. Pt stated that she took Benadryl last night and BC powder yesterday which were minimally effective.

## 2023-12-30 DIAGNOSIS — D2262 Melanocytic nevi of left upper limb, including shoulder: Secondary | ICD-10-CM | POA: Diagnosis not present

## 2023-12-30 DIAGNOSIS — B029 Zoster without complications: Secondary | ICD-10-CM | POA: Diagnosis not present

## 2023-12-30 DIAGNOSIS — L570 Actinic keratosis: Secondary | ICD-10-CM | POA: Diagnosis not present

## 2023-12-30 DIAGNOSIS — L718 Other rosacea: Secondary | ICD-10-CM | POA: Diagnosis not present

## 2024-01-28 ENCOUNTER — Other Ambulatory Visit: Payer: Self-pay | Admitting: Cardiology

## 2024-06-15 ENCOUNTER — Ambulatory Visit

## 2024-06-20 ENCOUNTER — Ambulatory Visit

## 2024-12-11 ENCOUNTER — Encounter: Admitting: Family Medicine
